# Patient Record
Sex: Male | Born: 1945 | Race: Black or African American | Hispanic: No | Smoking: Current some day smoker
Health system: Southern US, Community
[De-identification: ages and names within clinical notes are randomized; demographics above are authoritative.]

## PROBLEM LIST (undated history)

## (undated) DIAGNOSIS — Z789 Other specified health status: Secondary | ICD-10-CM

## (undated) DIAGNOSIS — I639 Cerebral infarction, unspecified: Secondary | ICD-10-CM

## (undated) DIAGNOSIS — F1010 Alcohol abuse, uncomplicated: Secondary | ICD-10-CM

## (undated) DIAGNOSIS — I119 Hypertensive heart disease without heart failure: Secondary | ICD-10-CM

## (undated) DIAGNOSIS — E785 Hyperlipidemia, unspecified: Secondary | ICD-10-CM

## (undated) DIAGNOSIS — F02818 Dementia in other diseases classified elsewhere, unspecified severity, with other behavioral disturbance: Secondary | ICD-10-CM

## (undated) DIAGNOSIS — F0281 Dementia in other diseases classified elsewhere with behavioral disturbance: Secondary | ICD-10-CM

## (undated) DIAGNOSIS — I219 Acute myocardial infarction, unspecified: Secondary | ICD-10-CM

## (undated) DIAGNOSIS — I43 Cardiomyopathy in diseases classified elsewhere: Secondary | ICD-10-CM

## (undated) DIAGNOSIS — Z593 Problems related to living in residential institution: Secondary | ICD-10-CM

## (undated) DIAGNOSIS — D638 Anemia in other chronic diseases classified elsewhere: Secondary | ICD-10-CM

## (undated) DIAGNOSIS — G309 Alzheimer's disease, unspecified: Secondary | ICD-10-CM

## (undated) HISTORY — DX: Acute myocardial infarction, unspecified: I21.9

## (undated) HISTORY — DX: Cerebral infarction, unspecified: I63.9

## (undated) HISTORY — DX: Cardiomyopathy in diseases classified elsewhere: I43

## (undated) HISTORY — DX: Hypertensive heart disease without heart failure: I11.9

## (undated) HISTORY — DX: Alcohol abuse, uncomplicated: F10.10

## (undated) HISTORY — DX: Dementia in other diseases classified elsewhere, unspecified severity, with other behavioral disturbance: F02.818

## (undated) HISTORY — DX: Anemia in other chronic diseases classified elsewhere: D63.8

## (undated) HISTORY — DX: Dementia in other diseases classified elsewhere with behavioral disturbance: F02.81

## (undated) HISTORY — DX: Alzheimer's disease, unspecified: G30.9

## (undated) HISTORY — PX: TONSILLECTOMY: SUR1361

---

## 1999-08-25 ENCOUNTER — Emergency Department (HOSPITAL_COMMUNITY): Admission: EM | Admit: 1999-08-25 | Discharge: 1999-08-25 | Payer: Self-pay | Admitting: Emergency Medicine

## 2006-02-23 ENCOUNTER — Emergency Department: Payer: Self-pay | Admitting: Unknown Physician Specialty

## 2006-03-04 ENCOUNTER — Other Ambulatory Visit: Payer: Self-pay

## 2006-03-04 ENCOUNTER — Emergency Department: Payer: Self-pay | Admitting: Emergency Medicine

## 2007-07-20 ENCOUNTER — Other Ambulatory Visit: Payer: Self-pay

## 2007-07-20 ENCOUNTER — Emergency Department: Payer: Self-pay | Admitting: Internal Medicine

## 2008-01-02 ENCOUNTER — Emergency Department: Payer: Self-pay | Admitting: Emergency Medicine

## 2008-09-04 ENCOUNTER — Other Ambulatory Visit: Payer: Self-pay

## 2008-09-04 ENCOUNTER — Emergency Department: Payer: Self-pay | Admitting: Emergency Medicine

## 2008-09-05 ENCOUNTER — Ambulatory Visit: Payer: Self-pay | Admitting: Emergency Medicine

## 2010-01-10 ENCOUNTER — Emergency Department: Payer: Self-pay | Admitting: Emergency Medicine

## 2011-04-30 ENCOUNTER — Emergency Department (HOSPITAL_COMMUNITY): Payer: PRIVATE HEALTH INSURANCE

## 2011-04-30 ENCOUNTER — Inpatient Hospital Stay (HOSPITAL_COMMUNITY): Payer: PRIVATE HEALTH INSURANCE

## 2011-04-30 ENCOUNTER — Encounter (HOSPITAL_COMMUNITY): Payer: Self-pay | Admitting: Radiology

## 2011-04-30 ENCOUNTER — Inpatient Hospital Stay (HOSPITAL_COMMUNITY)
Admission: EM | Admit: 2011-04-30 | Discharge: 2011-05-01 | DRG: 683 | Payer: PRIVATE HEALTH INSURANCE | Attending: Otolaryngology | Admitting: Otolaryngology

## 2011-04-30 DIAGNOSIS — I2510 Atherosclerotic heart disease of native coronary artery without angina pectoris: Secondary | ICD-10-CM | POA: Diagnosis present

## 2011-04-30 DIAGNOSIS — N400 Enlarged prostate without lower urinary tract symptoms: Secondary | ICD-10-CM | POA: Diagnosis present

## 2011-04-30 DIAGNOSIS — I700 Atherosclerosis of aorta: Secondary | ICD-10-CM | POA: Diagnosis present

## 2011-04-30 DIAGNOSIS — F17200 Nicotine dependence, unspecified, uncomplicated: Secondary | ICD-10-CM | POA: Diagnosis present

## 2011-04-30 DIAGNOSIS — N189 Chronic kidney disease, unspecified: Secondary | ICD-10-CM | POA: Diagnosis present

## 2011-04-30 DIAGNOSIS — J4489 Other specified chronic obstructive pulmonary disease: Secondary | ICD-10-CM | POA: Diagnosis present

## 2011-04-30 DIAGNOSIS — Q602 Renal agenesis, unspecified: Secondary | ICD-10-CM

## 2011-04-30 DIAGNOSIS — J8410 Pulmonary fibrosis, unspecified: Secondary | ICD-10-CM | POA: Diagnosis present

## 2011-04-30 DIAGNOSIS — Q605 Renal hypoplasia, unspecified: Secondary | ICD-10-CM

## 2011-04-30 DIAGNOSIS — R9431 Abnormal electrocardiogram [ECG] [EKG]: Secondary | ICD-10-CM | POA: Diagnosis present

## 2011-04-30 DIAGNOSIS — I129 Hypertensive chronic kidney disease with stage 1 through stage 4 chronic kidney disease, or unspecified chronic kidney disease: Principal | ICD-10-CM | POA: Diagnosis present

## 2011-04-30 DIAGNOSIS — J449 Chronic obstructive pulmonary disease, unspecified: Secondary | ICD-10-CM | POA: Diagnosis present

## 2011-04-30 DIAGNOSIS — K8020 Calculus of gallbladder without cholecystitis without obstruction: Secondary | ICD-10-CM | POA: Diagnosis present

## 2011-04-30 LAB — BASIC METABOLIC PANEL
CO2: 28 mEq/L (ref 19–32)
Calcium: 10.1 mg/dL (ref 8.4–10.5)
Creatinine, Ser: 2.98 mg/dL — ABNORMAL HIGH (ref 0.4–1.5)
GFR calc Af Amer: 26 mL/min — ABNORMAL LOW (ref >60)
GFR calc non Af Amer: 21 mL/min — ABNORMAL LOW (ref >60)

## 2011-04-30 LAB — POCT CARDIAC MARKERS: Myoglobin, poc: 34.4 ng/mL (ref 12–200)

## 2011-04-30 LAB — CBC
MCH: 27.4 pg (ref 26.0–34.0)
MCHC: 32.2 g/dL (ref 30.0–36.0)
Platelets: 193 10*3/uL (ref 150–400)
RDW: 13.5 % (ref 11.5–15.5)

## 2011-04-30 LAB — URINE MICROSCOPIC-ADD ON

## 2011-04-30 LAB — DIFFERENTIAL
Basophils Relative: 0 % (ref 0–1)
Eosinophils Absolute: 0.1 10*3/uL (ref 0.0–0.7)
Eosinophils Relative: 2 % (ref 0–5)
Monocytes Absolute: 0.7 10*3/uL (ref 0.1–1.0)
Monocytes Relative: 12 % (ref 3–12)

## 2011-04-30 LAB — URINALYSIS, ROUTINE W REFLEX MICROSCOPIC
Glucose, UA: NEGATIVE mg/dL
Protein, ur: NEGATIVE mg/dL

## 2011-04-30 LAB — HEPATIC FUNCTION PANEL
Albumin: 3.4 g/dL — ABNORMAL LOW (ref 3.5–5.2)
Bilirubin, Direct: <0.1 mg/dL (ref 0.0–0.3)
Total Bilirubin: 0.2 mg/dL — ABNORMAL LOW (ref 0.3–1.2)

## 2011-04-30 LAB — CREATININE, URINE, RANDOM: Creatinine, Urine: 35.3 mg/dL

## 2011-05-01 ENCOUNTER — Inpatient Hospital Stay (HOSPITAL_COMMUNITY): Payer: PRIVATE HEALTH INSURANCE

## 2011-05-01 LAB — DIFFERENTIAL
Basophils Relative: 0 % (ref 0–1)
Eosinophils Absolute: 0.1 10*3/uL (ref 0.0–0.7)
Monocytes Absolute: 0.9 10*3/uL (ref 0.1–1.0)
Neutro Abs: 2.5 10*3/uL (ref 1.7–7.7)

## 2011-05-01 LAB — INFLUENZA PANEL BY PCR (TYPE A & B): H1N1 flu by pcr: NOT DETECTED

## 2011-05-01 LAB — BASIC METABOLIC PANEL
BUN: 38 mg/dL — ABNORMAL HIGH (ref 6–23)
CO2: 26 mEq/L (ref 19–32)
Calcium: 9.5 mg/dL (ref 8.4–10.5)
Creatinine, Ser: 2.74 mg/dL — ABNORMAL HIGH (ref 0.4–1.5)
Glucose, Bld: 84 mg/dL (ref 70–99)

## 2011-05-01 LAB — CBC
MCH: 26.9 pg (ref 26.0–34.0)
MCHC: 31.7 g/dL (ref 30.0–36.0)
Platelets: 170 10*3/uL (ref 150–400)

## 2011-05-01 LAB — TSH: TSH: 3.341 u[IU]/mL (ref 0.350–4.500)

## 2011-05-01 LAB — MRSA PCR SCREENING: MRSA by PCR: NEGATIVE

## 2011-05-01 LAB — LIPID PANEL
HDL: 71 mg/dL (ref >39)
VLDL: 27 mg/dL (ref 0–40)

## 2011-05-01 LAB — PSA: PSA: 1.29 ng/mL (ref <=4.00)

## 2011-05-01 NOTE — H&P (Signed)
Kyle Short, Kyle Short              ACCOUNT NO.:  [id number]  MEDICAL RECORD NO.:  [id number]           PATIENT TYPE:  I  LOCATION:  A325                          FACILITY:  [facility]  PHYSICIAN:  Karlyn Agee, M.D. DATE OF BIRTH:  1946-02-10  DATE OF ADMISSION:  04/30/2011 DATE OF DISCHARGE:  LH                             HISTORY & PHYSICAL   PRIMARY CARE PHYSICIAN:  Unassigned.  CHIEF COMPLAINT:  Abnormal EKG.  HISTORY OF PRESENT ILLNESS:  This is a 65 year old African American gentleman with a history of hypertension.  He is usually seen at [facility] but has not had any medical followup for at least a year and is now incarcerated in the [facility] for the past 3 weeks.  The patient was taken for physical evaluation at [place] today and an EKG was done which was noted to be markedly abnormal and blood work was done which showed evidence of renal failure, and the patient was sent to the [facility] Emergency Room for further evaluation.  The patient denies any chest pain.  He denies any fever, cough, or cold. He denies any difficulty passing his urine either frequency or dysuria or blood in the urine.  He has never been told he has kidney disease.  PAST MEDICAL HISTORY:  Hypertension.  MEDICATIONS:  None.  ALLERGIES:  No known drug allergies.  SOCIAL HISTORY:  He is a retired Engineer, manufacturing systems.  When he is not in prison, he lives with a friend.  His next of kin is his daughter.  He reports his code status is a full code.  He reports that he smokes about three cigarettes per day but up to about 2 years ago, he was smoking much more about half a pack a day and typically maybe drinks about a beer per day.  FAMILY HISTORY:  Significant for mother who had diabetes and hypertension, a brother who had colon cancer, father who had Alzheimer's.  REVIEW OF SYSTEMS:  Other than noted above unremarkable.  PHYSICAL EXAMINATION:  GENERAL:  A pleasant, average built,  middle-aged Caucasian gentleman, lying flat on the stretcher, not in any acute distress. VITAL SIGNS:  His temperature is 98.1.  His pulse was initially 75, respirations 18, blood pressure initially 173/131.  He is on 10 mg of labetalol intravenously, and his blood pressure is now 160/78.  He is saturating at 100% on room air. HEENT AND NECK:  His pupils are round and equal.  Mucous membranes pink. Anicteric.  No cervical lymphadenopathy or thyromegaly.  No carotid bruit.  He has multiple broken and carious teeth.  No oral Candida. CHEST:  Clear to auscultation bilaterally. CARDIOVASCULAR:  Regular rhythm.  No murmurs. ABDOMEN:  Scaphoid, soft, and nontender.  No masses. EXTREMITIES:  Without edema.  He has 2+ dorsalis pedis pulses bilaterally.  He has arthritic deformities of the knees and ankles. SKIN:  Warm and dry.  No ulcerations. CENTRAL NERVOUS SYSTEM:  Cranial nerves II through XII are grossly intact.  He has no focal lateralizing signs.  LABORATORY DATA:  His CBC is unremarkable.  The white count of 5.8, hemoglobin 12.8,  platelets 193, and a normal differential.  Sodium is a little low at 134, potassium 3.9, chloride 99, CO2 is 28, glucose 128, BUN 40, creatinine 2.98, calcium is 10.1.  Cardiac markers are completely normal with undetectable troponins, myoglobin of 34, CK-MB 1.5.  Urinalysis shows clear urine with a trace of blood, negative for proteins, nitrites, leukocyte esterase, or ketones.  Urine microscopy is unremarkable.  CT scan of the head without contrast shows chronic small vessel ischemic disease with no acute abnormality. Two-view chest x-ray shows vague nodular densities of the right and left suprahilar region, right infrahilar airspace disease on the frontal and lateral views consistent with perihilar pneumonia, cardiomegaly. His EKG showed marked left ventricular hypertrophy with R-wave in aVL measuring 14 mm and asymmetrical T-wave inversions due to  repolarization abnormalities.  ASSESSMENT: 1. Hypertension, uncontrolled. 2. Kidney failure, acute versus chronic, in presence of hyponatremia     is in favor of acute. 3. Tobacco abuse. 4. Abnormal chest x-ray, rule out malignancy. 5. Tobacco abuse. 6. Medical noncompliance.  PLAN: 1. We will admit this gentleman for hydration to challenge his kidneys     and see if his renal function improves. 2. We will check his PSA and also get ultrasound of his kidneys. 3. Pending in improvement of his renal function, we will get a CT scan     of his chest to further characterize his lung abnormalities.  This     is unlikely to be a pneumonia given the absence of other symptoms     or signs of pneumonia.  We will control his blood pressure with     amlodipine and Lopressor initially 4. Other plans as per orders.     Karlyn Agee, M.D.    LC/MEDQ  D:  04/30/2011  T:  04/30/2011  Job:  [id number]  cc:   [place]  Electronically Signed by Karlyn Agee M.D. on 05/01/2011 09:09:52 AM

## 2011-05-05 NOTE — Discharge Summary (Signed)
NAMEHERCHELL, MARCEAU              ACCOUNT NO.:  [id number]  MEDICAL RECORD NO.:  [id number]           PATIENT TYPE:  I  LOCATION:  A325                          FACILITY:  [facility]  PHYSICIAN:  Peytin Dechert L. Conley Canal, MDDATE OF BIRTH:  [birth year]  DATE OF ADMISSION:  04/30/2011 DATE OF DISCHARGE:  05/18/2012LH                              DISCHARGE SUMMARY   DISCHARGE DIAGNOSES: 1. Malignant hypertension. 2. Headache and dizziness secondary to above. 3. Renal failure, suspect chronic. 4. Tobacco abuse, counseled against. 5. Prostatomegaly. 6. 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 7. Asymptomatic cholelithiasis. 8. Right upper lobe granuloma. 9. Chronic obstructive pulmonary disease with scarring. 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 11.Abnormal EKG, left ventricular hypertrophy with repolarization     abnormality.  DISCHARGE MEDICATIONS: 1. Metoprolol 50 mg p.o. b.i.d. 2. Norvasc 10 mg a day. 3. Aspirin 81 mg a day.  DIET:  Low-salt, heart healthy.  ACTIVITY:  Ad lib.  FOLLOWUP:  He will need to follow up with a primary care provider within 1-2 weeks in prison and then will need to have a primary care provider arranged for further monitoring or workup of his renal failure and malignant hypertension.  Also follow up his pending TSH and lipid profile.  Also follow up his PSA.  CONDITION:  Stable.  HISTORY AND HOSPITAL COURSE:  Mr. Kyle Short is an incarcerated 65 year old black male with a history of hypertension.  He has not taken his antihypertensives for several years.  He had no known reported history of kidney problems prior to admission that he could recall.  Apparently, he had headache and dizziness.  He went to see the prison nurse who noted that his blood pressure was high.  He was evaluated at [facility] who noted an abnormal EKG and elevated blood pressure, so the patient was sent to  the emergency room.  In the ER, his blood pressure was 173/131 and it decreased to 160/78 after IV labetalol.  His heart rate was 75 and otherwise normal vital signs.  He had poor dentition, clear lung sounds, moist mucous membranes, and no signs of cough or fever.  A chest x-ray initially showed concern of infiltrate and nodular densities.  The patient had LVH on EKG and a creatinine of almost 3.  There were no old creatinines or medical history initially on arrival.  The patient was admitted and given IV fluids.  A renal ultrasound showed a solitary left kidney which was 10 cm.  No hydronephrosis and normal renal cortical thickness.  Records were received from [facility] where the patient had been hospitalized previously.  In 2002, his creatinine was about 1.3, in 2007 it was 1.5, and in 2009 was 1.7.  I suspect this is progression of chronic kidney disease due to untreated hypertension and congenital solitary kidney. The patient's fractional excretion of sodium was measured above 1%.  His creatinine improved slightly with hydration but I suspect his baseline creatinine is close to his admission creatinine.  At the time of discharge, his blood pressure control was not optimal  but improved.  The prison staff is requesting discharge as soon as possible for resource reasons.  He is feeling much better.  No longer has a headache or dizziness.  He has had no chest pain, shortness of breath, or cough and is stable for discharge.  He will need further adjustment of his blood pressure medications of course.  LABORATORY DATA:  CBC normal.  Complete metabolic panel on admission significant for BUN of 40, creatinine 2.98.  Creatinine at discharge after hydration is 2.74.  Magnesium is normal.  Liver function tests were significant for an albumin of 3.4 only.  Cardiac enzymes normal. Fractional excretion of sodium is about 3%.  Flu swab was negative.  DIAGNOSTIC DATA:  EKG showed normal sinus  rhythm with LVH and repolarization abnormalities.  Chest x-ray showed cardiomegaly, right infrahilar pneumonia, cardiomegaly without failure.  CT of the brain showed chronic small vessel ischemia, nothing acute.  CT of the chest showed gallstones, right upper lobe granuloma, COPD with scarring.  No mass or lung nodule.  Renal ultrasound showed a solitary left kidney measuring 10 cm, no hydronephrosis, moderate prostatomegaly with gallstones.  TIME SPENT:  Total time on the day of discharge is greater than 30 minutes.     Jacey Eckerson L. Conley Canal, MD     CLS/MEDQ  D:  05/01/2011  T:  05/01/2011  Job:  [id number]  Electronically Signed by Doree Barthel MD on 05/05/2011 11:08:40 AM

## 2012-05-26 LAB — URINALYSIS, COMPLETE
Glucose,UR: NEGATIVE mg/dL (ref 0–75)
Ketone: NEGATIVE
Nitrite: NEGATIVE
Protein: 100
RBC,UR: 1 /HPF (ref 0–5)
WBC UR: 3 /HPF (ref 0–5)

## 2012-05-26 LAB — COMPREHENSIVE METABOLIC PANEL
Albumin: 3.5 g/dL (ref 3.4–5.0)
Alkaline Phosphatase: 92 U/L (ref 50–136)
Anion Gap: 12 (ref 7–16)
BUN: 24 mg/dL — ABNORMAL HIGH (ref 7–18)
Bilirubin,Total: 0.3 mg/dL (ref 0.2–1.0)
Calcium, Total: 9 mg/dL (ref 8.5–10.1)
Chloride: 108 mmol/L — ABNORMAL HIGH (ref 98–107)
Co2: 21 mmol/L (ref 21–32)
Creatinine: 2.74 mg/dL — ABNORMAL HIGH (ref 0.60–1.30)
EGFR (African American): 27 — ABNORMAL LOW
EGFR (Non-African Amer.): 23 — ABNORMAL LOW
Glucose: 134 mg/dL — ABNORMAL HIGH (ref 65–99)
Osmolality: 287 (ref 275–301)
Sodium: 141 mmol/L (ref 136–145)

## 2012-05-26 LAB — CBC
MCH: 27.1 pg (ref 26.0–34.0)
MCHC: 30.9 g/dL — ABNORMAL LOW (ref 32.0–36.0)
Platelet: 209 10*3/uL (ref 150–440)

## 2012-05-27 ENCOUNTER — Inpatient Hospital Stay: Payer: Self-pay | Admitting: Internal Medicine

## 2012-05-27 LAB — MAGNESIUM: Magnesium: 1.1 mg/dL — ABNORMAL LOW

## 2012-05-27 LAB — TSH: Thyroid Stimulating Horm: 1.12 u[IU]/mL

## 2012-05-27 LAB — ETHANOL: Ethanol %: <0.003 % (ref 0.000–0.080)

## 2012-05-28 LAB — CBC WITH DIFFERENTIAL/PLATELET
Eosinophil %: 0.1 %
Lymphocyte #: 1.7 10*3/uL (ref 1.0–3.6)
Lymphocyte %: 21.4 %
MCHC: 31.4 g/dL — ABNORMAL LOW (ref 32.0–36.0)
MCV: 86 fL (ref 80–100)
Monocyte %: 8.7 %
Neutrophil #: 5.6 10*3/uL (ref 1.4–6.5)
Neutrophil %: 69.6 %
Platelet: 197 10*3/uL (ref 150–440)
RBC: 4.3 10*6/uL — ABNORMAL LOW (ref 4.40–5.90)
RDW: 14.2 % (ref 11.5–14.5)
WBC: 8.1 10*3/uL (ref 3.8–10.6)

## 2012-05-28 LAB — BASIC METABOLIC PANEL
BUN: 29 mg/dL — ABNORMAL HIGH (ref 7–18)
Chloride: 103 mmol/L (ref 98–107)
Co2: 24 mmol/L (ref 21–32)
Creatinine: 2.22 mg/dL — ABNORMAL HIGH (ref 0.60–1.30)
EGFR (African American): 35 — ABNORMAL LOW
Osmolality: 281 (ref 275–301)
Potassium: 3.6 mmol/L (ref 3.5–5.1)
Sodium: 137 mmol/L (ref 136–145)

## 2012-05-28 LAB — LIPID PANEL
HDL Cholesterol: 121 mg/dL — ABNORMAL HIGH (ref 40–60)
Ldl Cholesterol, Calc: 72 mg/dL (ref 0–100)
Triglycerides: 59 mg/dL (ref 0–200)
VLDL Cholesterol, Calc: 12 mg/dL (ref 5–40)

## 2012-05-28 LAB — MAGNESIUM: Magnesium: 1.7 mg/dL — ABNORMAL LOW

## 2012-05-29 LAB — CREATININE, SERUM
EGFR (African American): 37 — ABNORMAL LOW
EGFR (Non-African Amer.): 32 — ABNORMAL LOW

## 2012-05-30 LAB — CBC WITH DIFFERENTIAL/PLATELET
Basophil #: 0 10*3/uL (ref 0.0–0.1)
Eosinophil #: 0.1 10*3/uL (ref 0.0–0.7)
Eosinophil %: 1.3 %
HCT: 36.3 % — ABNORMAL LOW (ref 40.0–52.0)
Lymphocyte #: 2.3 10*3/uL (ref 1.0–3.6)
MCH: 27.7 pg (ref 26.0–34.0)
MCHC: 32.3 g/dL (ref 32.0–36.0)
MCV: 86 fL (ref 80–100)
Monocyte #: 0.8 x10 3/mm (ref 0.2–1.0)
Monocyte %: 13.6 %
Neutrophil #: 2.7 10*3/uL (ref 1.4–6.5)
RBC: 4.23 10*6/uL — ABNORMAL LOW (ref 4.40–5.90)
RDW: 14 % (ref 11.5–14.5)

## 2012-05-30 LAB — BASIC METABOLIC PANEL
Calcium, Total: 9 mg/dL (ref 8.5–10.1)
Chloride: 103 mmol/L (ref 98–107)
Co2: 26 mmol/L (ref 21–32)
EGFR (African American): 33 — ABNORMAL LOW
EGFR (Non-African Amer.): 29 — ABNORMAL LOW
Glucose: 99 mg/dL (ref 65–99)
Sodium: 137 mmol/L (ref 136–145)

## 2013-01-13 ENCOUNTER — Inpatient Hospital Stay: Payer: Self-pay | Admitting: Specialist

## 2013-01-13 DIAGNOSIS — I214 Non-ST elevation (NSTEMI) myocardial infarction: Secondary | ICD-10-CM | POA: Diagnosis not present

## 2013-01-13 DIAGNOSIS — I129 Hypertensive chronic kidney disease with stage 1 through stage 4 chronic kidney disease, or unspecified chronic kidney disease: Secondary | ICD-10-CM | POA: Diagnosis not present

## 2013-01-13 DIAGNOSIS — Z9119 Patient's noncompliance with other medical treatment and regimen: Secondary | ICD-10-CM | POA: Diagnosis not present

## 2013-01-13 DIAGNOSIS — R748 Abnormal levels of other serum enzymes: Secondary | ICD-10-CM | POA: Diagnosis not present

## 2013-01-13 DIAGNOSIS — R799 Abnormal finding of blood chemistry, unspecified: Secondary | ICD-10-CM | POA: Diagnosis not present

## 2013-01-13 DIAGNOSIS — F17200 Nicotine dependence, unspecified, uncomplicated: Secondary | ICD-10-CM | POA: Diagnosis not present

## 2013-01-13 DIAGNOSIS — I509 Heart failure, unspecified: Secondary | ICD-10-CM | POA: Diagnosis not present

## 2013-01-13 DIAGNOSIS — I5022 Chronic systolic (congestive) heart failure: Secondary | ICD-10-CM | POA: Diagnosis not present

## 2013-01-13 DIAGNOSIS — R55 Syncope and collapse: Secondary | ICD-10-CM | POA: Diagnosis not present

## 2013-01-13 DIAGNOSIS — N189 Chronic kidney disease, unspecified: Secondary | ICD-10-CM | POA: Diagnosis not present

## 2013-01-13 DIAGNOSIS — I951 Orthostatic hypotension: Secondary | ICD-10-CM | POA: Diagnosis not present

## 2013-01-13 DIAGNOSIS — Z7982 Long term (current) use of aspirin: Secondary | ICD-10-CM | POA: Diagnosis not present

## 2013-01-13 DIAGNOSIS — I428 Other cardiomyopathies: Secondary | ICD-10-CM | POA: Diagnosis not present

## 2013-01-13 DIAGNOSIS — I10 Essential (primary) hypertension: Secondary | ICD-10-CM | POA: Diagnosis not present

## 2013-01-13 LAB — COMPREHENSIVE METABOLIC PANEL
Anion Gap: 11 (ref 7–16)
BUN: 57 mg/dL — ABNORMAL HIGH (ref 7–18)
Chloride: 108 mmol/L — ABNORMAL HIGH (ref 98–107)
EGFR (Non-African Amer.): 26 — ABNORMAL LOW
Glucose: 112 mg/dL — ABNORMAL HIGH (ref 65–99)
Osmolality: 292 (ref 275–301)
SGOT(AST): 53 U/L — ABNORMAL HIGH (ref 15–37)

## 2013-01-13 LAB — CBC WITH DIFFERENTIAL/PLATELET
Basophil #: 0 10*3/uL (ref 0.0–0.1)
Basophil %: 0.3 %
Eosinophil #: 0 10*3/uL (ref 0.0–0.7)
Eosinophil %: 0.3 %
HGB: 12.2 g/dL — ABNORMAL LOW (ref 13.0–18.0)
Lymphocyte #: 2.3 10*3/uL (ref 1.0–3.6)
Lymphocyte %: 26.3 %
MCH: 28.3 pg (ref 26.0–34.0)
Monocyte %: 12.5 %
Neutrophil #: 5.3 10*3/uL (ref 1.4–6.5)
Neutrophil %: 60.6 %
Platelet: 167 10*3/uL (ref 150–440)
RBC: 4.33 10*6/uL — ABNORMAL LOW (ref 4.40–5.90)
RDW: 13.1 % (ref 11.5–14.5)

## 2013-01-13 LAB — TROPONIN I: Troponin-I: 0.53 ng/mL — ABNORMAL HIGH

## 2013-01-13 LAB — CK TOTAL AND CKMB (NOT AT ARMC)
CK, Total: 902 U/L — ABNORMAL HIGH (ref 35–232)
CK-MB: 14.6 ng/mL — ABNORMAL HIGH (ref 0.5–3.6)

## 2013-01-14 LAB — CBC WITH DIFFERENTIAL/PLATELET
Basophil #: 0.1 10*3/uL (ref 0.0–0.1)
Eosinophil %: 2.2 %
HCT: 34.6 % — ABNORMAL LOW (ref 40.0–52.0)
Lymphocyte %: 41.6 %
MCH: 28.4 pg (ref 26.0–34.0)
MCV: 85 fL (ref 80–100)
Monocyte #: 1.1 x10 3/mm — ABNORMAL HIGH (ref 0.2–1.0)
Neutrophil %: 43.9 %
Platelet: 159 10*3/uL (ref 150–440)
RBC: 4.09 10*6/uL — ABNORMAL LOW (ref 4.40–5.90)
WBC: 9.4 10*3/uL (ref 3.8–10.6)

## 2013-01-14 LAB — CK TOTAL AND CKMB (NOT AT ARMC)
CK, Total: 1018 U/L — ABNORMAL HIGH (ref 35–232)
CK, Total: 1119 U/L — ABNORMAL HIGH (ref 35–232)
CK-MB: 10.2 ng/mL — ABNORMAL HIGH (ref 0.5–3.6)

## 2013-01-14 LAB — URINALYSIS, COMPLETE
Bacteria: NONE SEEN
Glucose,UR: NEGATIVE mg/dL (ref 0–75)
Ketone: NEGATIVE
Nitrite: NEGATIVE
Ph: 5 (ref 4.5–8.0)
Protein: 30
RBC,UR: 1 /HPF (ref 0–5)
Specific Gravity: 1.019 (ref 1.003–1.030)
Squamous Epithelial: <1
WBC UR: 1 /HPF (ref 0–5)

## 2013-01-14 LAB — APTT
Activated PTT: 131.3 secs — ABNORMAL HIGH (ref 23.6–35.9)
Activated PTT: 94.6 secs — ABNORMAL HIGH (ref 23.6–35.9)

## 2013-01-14 LAB — BASIC METABOLIC PANEL
BUN: 50 mg/dL — ABNORMAL HIGH (ref 7–18)
Calcium, Total: 8.6 mg/dL (ref 8.5–10.1)
Chloride: 108 mmol/L — ABNORMAL HIGH (ref 98–107)
Co2: 23 mmol/L (ref 21–32)
Creatinine: 2.41 mg/dL — ABNORMAL HIGH (ref 0.60–1.30)
EGFR (African American): 31 — ABNORMAL LOW
EGFR (Non-African Amer.): 27 — ABNORMAL LOW
Glucose: 124 mg/dL — ABNORMAL HIGH (ref 65–99)
Osmolality: 294 (ref 275–301)
Potassium: 3.5 mmol/L (ref 3.5–5.1)

## 2013-01-14 LAB — TROPONIN I
Troponin-I: 0.43 ng/mL — ABNORMAL HIGH
Troponin-I: 0.32 ng/mL — ABNORMAL HIGH

## 2013-01-14 LAB — HEMOGLOBIN A1C: Hemoglobin A1C: 6.6 % — ABNORMAL HIGH (ref 4.2–6.3)

## 2013-01-14 LAB — LIPID PANEL
Ldl Cholesterol, Calc: 41 mg/dL (ref 0–100)
Triglycerides: 86 mg/dL (ref 0–200)
VLDL Cholesterol, Calc: 17 mg/dL (ref 5–40)

## 2013-01-15 LAB — BASIC METABOLIC PANEL
Chloride: 106 mmol/L (ref 98–107)
Creatinine: 2.01 mg/dL — ABNORMAL HIGH (ref 0.60–1.30)
EGFR (African American): 39 — ABNORMAL LOW
Potassium: 3.8 mmol/L (ref 3.5–5.1)
Sodium: 138 mmol/L (ref 136–145)

## 2013-01-24 DIAGNOSIS — R079 Chest pain, unspecified: Secondary | ICD-10-CM | POA: Diagnosis not present

## 2013-01-26 DIAGNOSIS — I10 Essential (primary) hypertension: Secondary | ICD-10-CM | POA: Diagnosis not present

## 2013-01-31 DIAGNOSIS — I10 Essential (primary) hypertension: Secondary | ICD-10-CM | POA: Diagnosis not present

## 2013-01-31 DIAGNOSIS — I2510 Atherosclerotic heart disease of native coronary artery without angina pectoris: Secondary | ICD-10-CM | POA: Diagnosis not present

## 2013-01-31 DIAGNOSIS — E782 Mixed hyperlipidemia: Secondary | ICD-10-CM | POA: Diagnosis not present

## 2013-02-13 DIAGNOSIS — N2581 Secondary hyperparathyroidism of renal origin: Secondary | ICD-10-CM | POA: Diagnosis not present

## 2013-02-13 DIAGNOSIS — E875 Hyperkalemia: Secondary | ICD-10-CM | POA: Diagnosis not present

## 2013-02-13 DIAGNOSIS — I10 Essential (primary) hypertension: Secondary | ICD-10-CM | POA: Diagnosis not present

## 2013-02-28 DIAGNOSIS — F1021 Alcohol dependence, in remission: Secondary | ICD-10-CM | POA: Diagnosis not present

## 2013-02-28 DIAGNOSIS — N19 Unspecified kidney failure: Secondary | ICD-10-CM | POA: Diagnosis not present

## 2013-02-28 DIAGNOSIS — R262 Difficulty in walking, not elsewhere classified: Secondary | ICD-10-CM | POA: Diagnosis not present

## 2013-02-28 DIAGNOSIS — I951 Orthostatic hypotension: Secondary | ICD-10-CM | POA: Diagnosis not present

## 2013-02-28 DIAGNOSIS — R55 Syncope and collapse: Secondary | ICD-10-CM | POA: Diagnosis not present

## 2013-02-28 DIAGNOSIS — I10 Essential (primary) hypertension: Secondary | ICD-10-CM | POA: Diagnosis not present

## 2013-02-28 DIAGNOSIS — Z9181 History of falling: Secondary | ICD-10-CM | POA: Diagnosis not present

## 2013-02-28 DIAGNOSIS — R279 Unspecified lack of coordination: Secondary | ICD-10-CM | POA: Diagnosis not present

## 2013-02-28 DIAGNOSIS — Z87891 Personal history of nicotine dependence: Secondary | ICD-10-CM | POA: Diagnosis not present

## 2013-03-06 DIAGNOSIS — I10 Essential (primary) hypertension: Secondary | ICD-10-CM | POA: Diagnosis not present

## 2013-03-06 DIAGNOSIS — E785 Hyperlipidemia, unspecified: Secondary | ICD-10-CM | POA: Diagnosis not present

## 2013-03-08 DIAGNOSIS — R262 Difficulty in walking, not elsewhere classified: Secondary | ICD-10-CM | POA: Diagnosis not present

## 2013-03-08 DIAGNOSIS — R55 Syncope and collapse: Secondary | ICD-10-CM | POA: Diagnosis not present

## 2013-03-08 DIAGNOSIS — I951 Orthostatic hypotension: Secondary | ICD-10-CM | POA: Diagnosis not present

## 2013-03-08 DIAGNOSIS — R279 Unspecified lack of coordination: Secondary | ICD-10-CM | POA: Diagnosis not present

## 2013-03-08 DIAGNOSIS — I10 Essential (primary) hypertension: Secondary | ICD-10-CM | POA: Diagnosis not present

## 2013-03-08 DIAGNOSIS — N19 Unspecified kidney failure: Secondary | ICD-10-CM | POA: Diagnosis not present

## 2013-03-09 DIAGNOSIS — I951 Orthostatic hypotension: Secondary | ICD-10-CM | POA: Diagnosis not present

## 2013-03-09 DIAGNOSIS — I10 Essential (primary) hypertension: Secondary | ICD-10-CM | POA: Diagnosis not present

## 2013-03-09 DIAGNOSIS — R279 Unspecified lack of coordination: Secondary | ICD-10-CM | POA: Diagnosis not present

## 2013-03-09 DIAGNOSIS — N19 Unspecified kidney failure: Secondary | ICD-10-CM | POA: Diagnosis not present

## 2013-03-09 DIAGNOSIS — R55 Syncope and collapse: Secondary | ICD-10-CM | POA: Diagnosis not present

## 2013-03-09 DIAGNOSIS — R262 Difficulty in walking, not elsewhere classified: Secondary | ICD-10-CM | POA: Diagnosis not present

## 2013-03-10 DIAGNOSIS — N19 Unspecified kidney failure: Secondary | ICD-10-CM | POA: Diagnosis not present

## 2013-03-10 DIAGNOSIS — R279 Unspecified lack of coordination: Secondary | ICD-10-CM | POA: Diagnosis not present

## 2013-03-10 DIAGNOSIS — I10 Essential (primary) hypertension: Secondary | ICD-10-CM | POA: Diagnosis not present

## 2013-03-10 DIAGNOSIS — R262 Difficulty in walking, not elsewhere classified: Secondary | ICD-10-CM | POA: Diagnosis not present

## 2013-03-10 DIAGNOSIS — I951 Orthostatic hypotension: Secondary | ICD-10-CM | POA: Diagnosis not present

## 2013-03-10 DIAGNOSIS — R55 Syncope and collapse: Secondary | ICD-10-CM | POA: Diagnosis not present

## 2013-03-13 DIAGNOSIS — R55 Syncope and collapse: Secondary | ICD-10-CM | POA: Diagnosis not present

## 2013-03-13 DIAGNOSIS — I10 Essential (primary) hypertension: Secondary | ICD-10-CM | POA: Diagnosis not present

## 2013-03-13 DIAGNOSIS — R262 Difficulty in walking, not elsewhere classified: Secondary | ICD-10-CM | POA: Diagnosis not present

## 2013-03-13 DIAGNOSIS — R279 Unspecified lack of coordination: Secondary | ICD-10-CM | POA: Diagnosis not present

## 2013-03-13 DIAGNOSIS — N19 Unspecified kidney failure: Secondary | ICD-10-CM | POA: Diagnosis not present

## 2013-03-13 DIAGNOSIS — I951 Orthostatic hypotension: Secondary | ICD-10-CM | POA: Diagnosis not present

## 2013-03-14 DIAGNOSIS — R262 Difficulty in walking, not elsewhere classified: Secondary | ICD-10-CM | POA: Diagnosis not present

## 2013-03-14 DIAGNOSIS — I10 Essential (primary) hypertension: Secondary | ICD-10-CM | POA: Diagnosis not present

## 2013-03-14 DIAGNOSIS — R55 Syncope and collapse: Secondary | ICD-10-CM | POA: Diagnosis not present

## 2013-03-14 DIAGNOSIS — N19 Unspecified kidney failure: Secondary | ICD-10-CM | POA: Diagnosis not present

## 2013-03-14 DIAGNOSIS — I951 Orthostatic hypotension: Secondary | ICD-10-CM | POA: Diagnosis not present

## 2013-03-14 DIAGNOSIS — R279 Unspecified lack of coordination: Secondary | ICD-10-CM | POA: Diagnosis not present

## 2013-03-15 DIAGNOSIS — R279 Unspecified lack of coordination: Secondary | ICD-10-CM | POA: Diagnosis not present

## 2013-03-15 DIAGNOSIS — N19 Unspecified kidney failure: Secondary | ICD-10-CM | POA: Diagnosis not present

## 2013-03-15 DIAGNOSIS — I10 Essential (primary) hypertension: Secondary | ICD-10-CM | POA: Diagnosis not present

## 2013-03-15 DIAGNOSIS — R262 Difficulty in walking, not elsewhere classified: Secondary | ICD-10-CM | POA: Diagnosis not present

## 2013-03-15 DIAGNOSIS — I951 Orthostatic hypotension: Secondary | ICD-10-CM | POA: Diagnosis not present

## 2013-03-15 DIAGNOSIS — R55 Syncope and collapse: Secondary | ICD-10-CM | POA: Diagnosis not present

## 2013-03-20 DIAGNOSIS — R55 Syncope and collapse: Secondary | ICD-10-CM | POA: Diagnosis not present

## 2013-03-20 DIAGNOSIS — N19 Unspecified kidney failure: Secondary | ICD-10-CM | POA: Diagnosis not present

## 2013-03-20 DIAGNOSIS — I10 Essential (primary) hypertension: Secondary | ICD-10-CM | POA: Diagnosis not present

## 2013-03-20 DIAGNOSIS — I951 Orthostatic hypotension: Secondary | ICD-10-CM | POA: Diagnosis not present

## 2013-03-20 DIAGNOSIS — R279 Unspecified lack of coordination: Secondary | ICD-10-CM | POA: Diagnosis not present

## 2013-03-20 DIAGNOSIS — R262 Difficulty in walking, not elsewhere classified: Secondary | ICD-10-CM | POA: Diagnosis not present

## 2013-03-21 DIAGNOSIS — R279 Unspecified lack of coordination: Secondary | ICD-10-CM | POA: Diagnosis not present

## 2013-03-21 DIAGNOSIS — I951 Orthostatic hypotension: Secondary | ICD-10-CM | POA: Diagnosis not present

## 2013-03-21 DIAGNOSIS — I10 Essential (primary) hypertension: Secondary | ICD-10-CM | POA: Diagnosis not present

## 2013-03-21 DIAGNOSIS — R55 Syncope and collapse: Secondary | ICD-10-CM | POA: Diagnosis not present

## 2013-03-21 DIAGNOSIS — R262 Difficulty in walking, not elsewhere classified: Secondary | ICD-10-CM | POA: Diagnosis not present

## 2013-03-21 DIAGNOSIS — N19 Unspecified kidney failure: Secondary | ICD-10-CM | POA: Diagnosis not present

## 2013-03-22 DIAGNOSIS — R262 Difficulty in walking, not elsewhere classified: Secondary | ICD-10-CM | POA: Diagnosis not present

## 2013-03-22 DIAGNOSIS — R55 Syncope and collapse: Secondary | ICD-10-CM | POA: Diagnosis not present

## 2013-03-22 DIAGNOSIS — N19 Unspecified kidney failure: Secondary | ICD-10-CM | POA: Diagnosis not present

## 2013-03-22 DIAGNOSIS — I10 Essential (primary) hypertension: Secondary | ICD-10-CM | POA: Diagnosis not present

## 2013-03-22 DIAGNOSIS — R279 Unspecified lack of coordination: Secondary | ICD-10-CM | POA: Diagnosis not present

## 2013-03-22 DIAGNOSIS — I951 Orthostatic hypotension: Secondary | ICD-10-CM | POA: Diagnosis not present

## 2013-03-27 DIAGNOSIS — N19 Unspecified kidney failure: Secondary | ICD-10-CM | POA: Diagnosis not present

## 2013-03-27 DIAGNOSIS — R262 Difficulty in walking, not elsewhere classified: Secondary | ICD-10-CM | POA: Diagnosis not present

## 2013-03-27 DIAGNOSIS — I951 Orthostatic hypotension: Secondary | ICD-10-CM | POA: Diagnosis not present

## 2013-03-27 DIAGNOSIS — R279 Unspecified lack of coordination: Secondary | ICD-10-CM | POA: Diagnosis not present

## 2013-03-27 DIAGNOSIS — R55 Syncope and collapse: Secondary | ICD-10-CM | POA: Diagnosis not present

## 2013-03-27 DIAGNOSIS — I10 Essential (primary) hypertension: Secondary | ICD-10-CM | POA: Diagnosis not present

## 2013-03-29 DIAGNOSIS — N19 Unspecified kidney failure: Secondary | ICD-10-CM | POA: Diagnosis not present

## 2013-03-29 DIAGNOSIS — R262 Difficulty in walking, not elsewhere classified: Secondary | ICD-10-CM | POA: Diagnosis not present

## 2013-03-29 DIAGNOSIS — R279 Unspecified lack of coordination: Secondary | ICD-10-CM | POA: Diagnosis not present

## 2013-03-29 DIAGNOSIS — I10 Essential (primary) hypertension: Secondary | ICD-10-CM | POA: Diagnosis not present

## 2013-03-29 DIAGNOSIS — R55 Syncope and collapse: Secondary | ICD-10-CM | POA: Diagnosis not present

## 2013-03-29 DIAGNOSIS — I951 Orthostatic hypotension: Secondary | ICD-10-CM | POA: Diagnosis not present

## 2013-04-01 DIAGNOSIS — R55 Syncope and collapse: Secondary | ICD-10-CM | POA: Diagnosis not present

## 2013-04-01 DIAGNOSIS — R279 Unspecified lack of coordination: Secondary | ICD-10-CM | POA: Diagnosis not present

## 2013-04-01 DIAGNOSIS — N19 Unspecified kidney failure: Secondary | ICD-10-CM | POA: Diagnosis not present

## 2013-04-01 DIAGNOSIS — I951 Orthostatic hypotension: Secondary | ICD-10-CM | POA: Diagnosis not present

## 2013-04-01 DIAGNOSIS — I10 Essential (primary) hypertension: Secondary | ICD-10-CM | POA: Diagnosis not present

## 2013-04-01 DIAGNOSIS — R262 Difficulty in walking, not elsewhere classified: Secondary | ICD-10-CM | POA: Diagnosis not present

## 2013-04-03 DIAGNOSIS — R279 Unspecified lack of coordination: Secondary | ICD-10-CM | POA: Diagnosis not present

## 2013-04-03 DIAGNOSIS — R262 Difficulty in walking, not elsewhere classified: Secondary | ICD-10-CM | POA: Diagnosis not present

## 2013-04-03 DIAGNOSIS — I951 Orthostatic hypotension: Secondary | ICD-10-CM | POA: Diagnosis not present

## 2013-04-03 DIAGNOSIS — I10 Essential (primary) hypertension: Secondary | ICD-10-CM | POA: Diagnosis not present

## 2013-04-03 DIAGNOSIS — N19 Unspecified kidney failure: Secondary | ICD-10-CM | POA: Diagnosis not present

## 2013-04-03 DIAGNOSIS — R55 Syncope and collapse: Secondary | ICD-10-CM | POA: Diagnosis not present

## 2013-04-04 DIAGNOSIS — R279 Unspecified lack of coordination: Secondary | ICD-10-CM | POA: Diagnosis not present

## 2013-04-04 DIAGNOSIS — N19 Unspecified kidney failure: Secondary | ICD-10-CM | POA: Diagnosis not present

## 2013-04-04 DIAGNOSIS — I951 Orthostatic hypotension: Secondary | ICD-10-CM | POA: Diagnosis not present

## 2013-04-04 DIAGNOSIS — R55 Syncope and collapse: Secondary | ICD-10-CM | POA: Diagnosis not present

## 2013-04-04 DIAGNOSIS — R262 Difficulty in walking, not elsewhere classified: Secondary | ICD-10-CM | POA: Diagnosis not present

## 2013-04-04 DIAGNOSIS — I10 Essential (primary) hypertension: Secondary | ICD-10-CM | POA: Diagnosis not present

## 2013-04-06 DIAGNOSIS — R262 Difficulty in walking, not elsewhere classified: Secondary | ICD-10-CM | POA: Diagnosis not present

## 2013-04-06 DIAGNOSIS — R279 Unspecified lack of coordination: Secondary | ICD-10-CM | POA: Diagnosis not present

## 2013-04-06 DIAGNOSIS — N19 Unspecified kidney failure: Secondary | ICD-10-CM | POA: Diagnosis not present

## 2013-04-06 DIAGNOSIS — I10 Essential (primary) hypertension: Secondary | ICD-10-CM | POA: Diagnosis not present

## 2013-04-06 DIAGNOSIS — I951 Orthostatic hypotension: Secondary | ICD-10-CM | POA: Diagnosis not present

## 2013-04-06 DIAGNOSIS — R55 Syncope and collapse: Secondary | ICD-10-CM | POA: Diagnosis not present

## 2013-04-11 DIAGNOSIS — I951 Orthostatic hypotension: Secondary | ICD-10-CM | POA: Diagnosis not present

## 2013-04-11 DIAGNOSIS — R55 Syncope and collapse: Secondary | ICD-10-CM | POA: Diagnosis not present

## 2013-04-11 DIAGNOSIS — R279 Unspecified lack of coordination: Secondary | ICD-10-CM | POA: Diagnosis not present

## 2013-04-11 DIAGNOSIS — I10 Essential (primary) hypertension: Secondary | ICD-10-CM | POA: Diagnosis not present

## 2013-04-11 DIAGNOSIS — R262 Difficulty in walking, not elsewhere classified: Secondary | ICD-10-CM | POA: Diagnosis not present

## 2013-04-11 DIAGNOSIS — N19 Unspecified kidney failure: Secondary | ICD-10-CM | POA: Diagnosis not present

## 2013-04-13 DIAGNOSIS — I951 Orthostatic hypotension: Secondary | ICD-10-CM | POA: Diagnosis not present

## 2013-04-13 DIAGNOSIS — R262 Difficulty in walking, not elsewhere classified: Secondary | ICD-10-CM | POA: Diagnosis not present

## 2013-04-13 DIAGNOSIS — N19 Unspecified kidney failure: Secondary | ICD-10-CM | POA: Diagnosis not present

## 2013-04-13 DIAGNOSIS — R279 Unspecified lack of coordination: Secondary | ICD-10-CM | POA: Diagnosis not present

## 2013-04-13 DIAGNOSIS — I10 Essential (primary) hypertension: Secondary | ICD-10-CM | POA: Diagnosis not present

## 2013-04-13 DIAGNOSIS — R55 Syncope and collapse: Secondary | ICD-10-CM | POA: Diagnosis not present

## 2013-04-17 DIAGNOSIS — R262 Difficulty in walking, not elsewhere classified: Secondary | ICD-10-CM | POA: Diagnosis not present

## 2013-04-17 DIAGNOSIS — I951 Orthostatic hypotension: Secondary | ICD-10-CM | POA: Diagnosis not present

## 2013-04-17 DIAGNOSIS — R55 Syncope and collapse: Secondary | ICD-10-CM | POA: Diagnosis not present

## 2013-04-17 DIAGNOSIS — N19 Unspecified kidney failure: Secondary | ICD-10-CM | POA: Diagnosis not present

## 2013-04-17 DIAGNOSIS — R279 Unspecified lack of coordination: Secondary | ICD-10-CM | POA: Diagnosis not present

## 2013-04-17 DIAGNOSIS — I10 Essential (primary) hypertension: Secondary | ICD-10-CM | POA: Diagnosis not present

## 2013-04-21 DIAGNOSIS — I10 Essential (primary) hypertension: Secondary | ICD-10-CM | POA: Diagnosis not present

## 2013-04-21 DIAGNOSIS — N039 Chronic nephritic syndrome with unspecified morphologic changes: Secondary | ICD-10-CM | POA: Diagnosis not present

## 2013-04-21 DIAGNOSIS — N184 Chronic kidney disease, stage 4 (severe): Secondary | ICD-10-CM | POA: Diagnosis not present

## 2013-04-21 DIAGNOSIS — D631 Anemia in chronic kidney disease: Secondary | ICD-10-CM | POA: Diagnosis not present

## 2013-04-21 DIAGNOSIS — N2581 Secondary hyperparathyroidism of renal origin: Secondary | ICD-10-CM | POA: Diagnosis not present

## 2013-04-21 DIAGNOSIS — R809 Proteinuria, unspecified: Secondary | ICD-10-CM | POA: Diagnosis not present

## 2013-04-21 DIAGNOSIS — B351 Tinea unguium: Secondary | ICD-10-CM | POA: Diagnosis not present

## 2013-04-21 DIAGNOSIS — M79609 Pain in unspecified limb: Secondary | ICD-10-CM | POA: Diagnosis not present

## 2013-04-24 DIAGNOSIS — D631 Anemia in chronic kidney disease: Secondary | ICD-10-CM | POA: Diagnosis not present

## 2013-04-24 DIAGNOSIS — N184 Chronic kidney disease, stage 4 (severe): Secondary | ICD-10-CM | POA: Diagnosis not present

## 2013-04-24 DIAGNOSIS — R809 Proteinuria, unspecified: Secondary | ICD-10-CM | POA: Diagnosis not present

## 2013-05-09 DIAGNOSIS — I519 Heart disease, unspecified: Secondary | ICD-10-CM | POA: Diagnosis not present

## 2013-05-09 DIAGNOSIS — E782 Mixed hyperlipidemia: Secondary | ICD-10-CM | POA: Diagnosis not present

## 2013-05-09 DIAGNOSIS — I10 Essential (primary) hypertension: Secondary | ICD-10-CM | POA: Diagnosis not present

## 2013-05-09 DIAGNOSIS — I2510 Atherosclerotic heart disease of native coronary artery without angina pectoris: Secondary | ICD-10-CM | POA: Diagnosis not present

## 2013-05-12 ENCOUNTER — Ambulatory Visit: Payer: Self-pay | Admitting: Family Medicine

## 2013-05-12 DIAGNOSIS — F17200 Nicotine dependence, unspecified, uncomplicated: Secondary | ICD-10-CM | POA: Diagnosis not present

## 2013-05-12 DIAGNOSIS — R918 Other nonspecific abnormal finding of lung field: Secondary | ICD-10-CM | POA: Diagnosis not present

## 2013-05-12 DIAGNOSIS — R9389 Abnormal findings on diagnostic imaging of other specified body structures: Secondary | ICD-10-CM | POA: Diagnosis not present

## 2013-05-12 DIAGNOSIS — Z8673 Personal history of transient ischemic attack (TIA), and cerebral infarction without residual deficits: Secondary | ICD-10-CM | POA: Diagnosis not present

## 2013-05-12 DIAGNOSIS — E785 Hyperlipidemia, unspecified: Secondary | ICD-10-CM | POA: Diagnosis not present

## 2013-05-12 DIAGNOSIS — G4700 Insomnia, unspecified: Secondary | ICD-10-CM | POA: Diagnosis not present

## 2013-05-26 DIAGNOSIS — N2581 Secondary hyperparathyroidism of renal origin: Secondary | ICD-10-CM | POA: Diagnosis not present

## 2013-05-26 DIAGNOSIS — N184 Chronic kidney disease, stage 4 (severe): Secondary | ICD-10-CM | POA: Diagnosis not present

## 2013-05-26 DIAGNOSIS — D631 Anemia in chronic kidney disease: Secondary | ICD-10-CM | POA: Diagnosis not present

## 2013-07-06 DIAGNOSIS — B351 Tinea unguium: Secondary | ICD-10-CM | POA: Diagnosis not present

## 2013-07-06 DIAGNOSIS — M79609 Pain in unspecified limb: Secondary | ICD-10-CM | POA: Diagnosis not present

## 2013-07-31 DIAGNOSIS — N184 Chronic kidney disease, stage 4 (severe): Secondary | ICD-10-CM | POA: Diagnosis not present

## 2013-07-31 DIAGNOSIS — D631 Anemia in chronic kidney disease: Secondary | ICD-10-CM | POA: Diagnosis not present

## 2013-07-31 DIAGNOSIS — N2581 Secondary hyperparathyroidism of renal origin: Secondary | ICD-10-CM | POA: Diagnosis not present

## 2013-07-31 DIAGNOSIS — I10 Essential (primary) hypertension: Secondary | ICD-10-CM | POA: Diagnosis not present

## 2013-08-03 DIAGNOSIS — Z0100 Encounter for examination of eyes and vision without abnormal findings: Secondary | ICD-10-CM | POA: Diagnosis not present

## 2013-08-03 DIAGNOSIS — H52229 Regular astigmatism, unspecified eye: Secondary | ICD-10-CM | POA: Diagnosis not present

## 2013-08-03 DIAGNOSIS — H524 Presbyopia: Secondary | ICD-10-CM | POA: Diagnosis not present

## 2013-08-03 DIAGNOSIS — H2510 Age-related nuclear cataract, unspecified eye: Secondary | ICD-10-CM | POA: Diagnosis not present

## 2013-08-29 DIAGNOSIS — N184 Chronic kidney disease, stage 4 (severe): Secondary | ICD-10-CM | POA: Diagnosis not present

## 2013-08-29 DIAGNOSIS — N2581 Secondary hyperparathyroidism of renal origin: Secondary | ICD-10-CM | POA: Diagnosis not present

## 2013-08-29 DIAGNOSIS — D631 Anemia in chronic kidney disease: Secondary | ICD-10-CM | POA: Diagnosis not present

## 2013-08-29 DIAGNOSIS — I10 Essential (primary) hypertension: Secondary | ICD-10-CM | POA: Diagnosis not present

## 2013-09-12 ENCOUNTER — Inpatient Hospital Stay: Payer: Self-pay | Admitting: Family Medicine

## 2013-09-12 DIAGNOSIS — R05 Cough: Secondary | ICD-10-CM | POA: Diagnosis not present

## 2013-09-12 DIAGNOSIS — Z79899 Other long term (current) drug therapy: Secondary | ICD-10-CM | POA: Diagnosis not present

## 2013-09-12 DIAGNOSIS — I428 Other cardiomyopathies: Secondary | ICD-10-CM | POA: Diagnosis not present

## 2013-09-12 DIAGNOSIS — J9819 Other pulmonary collapse: Secondary | ICD-10-CM | POA: Diagnosis not present

## 2013-09-12 DIAGNOSIS — G9341 Metabolic encephalopathy: Secondary | ICD-10-CM | POA: Diagnosis not present

## 2013-09-12 DIAGNOSIS — N189 Chronic kidney disease, unspecified: Secondary | ICD-10-CM | POA: Diagnosis present

## 2013-09-12 DIAGNOSIS — I129 Hypertensive chronic kidney disease with stage 1 through stage 4 chronic kidney disease, or unspecified chronic kidney disease: Secondary | ICD-10-CM | POA: Diagnosis present

## 2013-09-12 DIAGNOSIS — I10 Essential (primary) hypertension: Secondary | ICD-10-CM | POA: Diagnosis not present

## 2013-09-12 DIAGNOSIS — Z8249 Family history of ischemic heart disease and other diseases of the circulatory system: Secondary | ICD-10-CM | POA: Diagnosis not present

## 2013-09-12 DIAGNOSIS — B964 Proteus (mirabilis) (morganii) as the cause of diseases classified elsewhere: Secondary | ICD-10-CM | POA: Diagnosis present

## 2013-09-12 DIAGNOSIS — D649 Anemia, unspecified: Secondary | ICD-10-CM | POA: Diagnosis not present

## 2013-09-12 DIAGNOSIS — Z23 Encounter for immunization: Secondary | ICD-10-CM | POA: Diagnosis not present

## 2013-09-12 DIAGNOSIS — Z7982 Long term (current) use of aspirin: Secondary | ICD-10-CM | POA: Diagnosis not present

## 2013-09-12 DIAGNOSIS — Z9119 Patient's noncompliance with other medical treatment and regimen: Secondary | ICD-10-CM | POA: Diagnosis not present

## 2013-09-12 DIAGNOSIS — I498 Other specified cardiac arrhythmias: Secondary | ICD-10-CM | POA: Diagnosis present

## 2013-09-12 DIAGNOSIS — N179 Acute kidney failure, unspecified: Secondary | ICD-10-CM | POA: Diagnosis not present

## 2013-09-12 DIAGNOSIS — R0989 Other specified symptoms and signs involving the circulatory and respiratory systems: Secondary | ICD-10-CM | POA: Diagnosis not present

## 2013-09-12 DIAGNOSIS — Z833 Family history of diabetes mellitus: Secondary | ICD-10-CM | POA: Diagnosis not present

## 2013-09-12 DIAGNOSIS — N390 Urinary tract infection, site not specified: Secondary | ICD-10-CM | POA: Diagnosis not present

## 2013-09-12 DIAGNOSIS — F17200 Nicotine dependence, unspecified, uncomplicated: Secondary | ICD-10-CM | POA: Diagnosis present

## 2013-09-12 DIAGNOSIS — R4182 Altered mental status, unspecified: Secondary | ICD-10-CM | POA: Diagnosis not present

## 2013-09-12 LAB — URINALYSIS, COMPLETE
Bilirubin,UR: NEGATIVE
Glucose,UR: NEGATIVE mg/dL (ref 0–75)
Hyaline Cast: 45
Ketone: NEGATIVE
Nitrite: NEGATIVE
Ph: 6 (ref 4.5–8.0)
RBC,UR: 71 /HPF (ref 0–5)
Squamous Epithelial: 7
WBC UR: 656 /HPF (ref 0–5)

## 2013-09-12 LAB — DRUG SCREEN, URINE
Amphetamines, Ur Screen: NEGATIVE (ref ?–1000)
Barbiturates, Ur Screen: NEGATIVE (ref ?–200)
Benzodiazepine, Ur Scrn: NEGATIVE (ref ?–200)
Cocaine Metabolite,Ur ~~LOC~~: NEGATIVE (ref ?–300)
MDMA (Ecstasy)Ur Screen: NEGATIVE (ref ?–500)
Methadone, Ur Screen: NEGATIVE (ref ?–300)
Phencyclidine (PCP) Ur S: NEGATIVE (ref ?–25)

## 2013-09-12 LAB — TROPONIN I: Troponin-I: 0.02 ng/mL

## 2013-09-12 LAB — COMPREHENSIVE METABOLIC PANEL
Alkaline Phosphatase: 100 U/L (ref 50–136)
Anion Gap: 6 — ABNORMAL LOW (ref 7–16)
BUN: 32 mg/dL — ABNORMAL HIGH (ref 7–18)
Bilirubin,Total: 0.3 mg/dL (ref 0.2–1.0)
Calcium, Total: 9.2 mg/dL (ref 8.5–10.1)
Chloride: 105 mmol/L (ref 98–107)
Creatinine: 2.67 mg/dL — ABNORMAL HIGH (ref 0.60–1.30)
EGFR (African American): 28 — ABNORMAL LOW
Glucose: 166 mg/dL — ABNORMAL HIGH (ref 65–99)
Osmolality: 281 (ref 275–301)
Potassium: 4.5 mmol/L (ref 3.5–5.1)
SGOT(AST): 34 U/L (ref 15–37)
Total Protein: 8.1 g/dL (ref 6.4–8.2)

## 2013-09-12 LAB — CBC
HCT: 37.5 % — ABNORMAL LOW (ref 40.0–52.0)
MCHC: 32.9 g/dL (ref 32.0–36.0)
Platelet: 209 10*3/uL (ref 150–440)
WBC: 4.6 10*3/uL (ref 3.8–10.6)

## 2013-09-12 LAB — ETHANOL
Ethanol %: <0.003 % (ref 0.000–0.080)
Ethanol: <3 mg/dL

## 2013-09-12 LAB — PROTIME-INR: Prothrombin Time: 13.2 secs (ref 11.5–14.7)

## 2013-09-13 LAB — HEMOGLOBIN: HGB: 10.6 g/dL — ABNORMAL LOW (ref 13.0–18.0)

## 2013-09-13 LAB — CBC WITH DIFFERENTIAL/PLATELET
Basophil %: 0.5 %
Eosinophil #: 0.2 10*3/uL (ref 0.0–0.7)
Eosinophil %: 2.8 %
HCT: 29.5 % — ABNORMAL LOW (ref 40.0–52.0)
HGB: 9.9 g/dL — ABNORMAL LOW (ref 13.0–18.0)
Lymphocyte #: 2.5 10*3/uL (ref 1.0–3.6)
Lymphocyte %: 41.2 %
MCH: 28.4 pg (ref 26.0–34.0)
Neutrophil #: 2.5 10*3/uL (ref 1.4–6.5)
Platelet: 177 10*3/uL (ref 150–440)
RBC: 3.48 10*6/uL — ABNORMAL LOW (ref 4.40–5.90)
RDW: 13.8 % (ref 11.5–14.5)
WBC: 6 10*3/uL (ref 3.8–10.6)

## 2013-09-13 LAB — MAGNESIUM: Magnesium: 1.3 mg/dL — ABNORMAL LOW

## 2013-09-13 LAB — BASIC METABOLIC PANEL
BUN: 30 mg/dL — ABNORMAL HIGH (ref 7–18)
EGFR (Non-African Amer.): 29 — ABNORMAL LOW
Glucose: 99 mg/dL (ref 65–99)
Osmolality: 284 (ref 275–301)
Potassium: 4 mmol/L (ref 3.5–5.1)

## 2013-09-14 LAB — BASIC METABOLIC PANEL
BUN: 26 mg/dL — ABNORMAL HIGH (ref 7–18)
Calcium, Total: 9.1 mg/dL (ref 8.5–10.1)
Creatinine: 2.3 mg/dL — ABNORMAL HIGH (ref 0.60–1.30)
EGFR (African American): 33 — ABNORMAL LOW
EGFR (Non-African Amer.): 29 — ABNORMAL LOW
Glucose: 103 mg/dL — ABNORMAL HIGH (ref 65–99)
Sodium: 138 mmol/L (ref 136–145)

## 2013-09-14 LAB — CBC WITH DIFFERENTIAL/PLATELET
Basophil #: 0 10*3/uL (ref 0.0–0.1)
Basophil %: 0.7 %
HCT: 32.8 % — ABNORMAL LOW (ref 40.0–52.0)
HGB: 11 g/dL — ABNORMAL LOW (ref 13.0–18.0)
MCH: 28 pg (ref 26.0–34.0)
Neutrophil %: 38.5 %

## 2013-09-14 LAB — URINE CULTURE

## 2013-09-17 LAB — CULTURE, BLOOD (SINGLE)

## 2013-09-21 DIAGNOSIS — E119 Type 2 diabetes mellitus without complications: Secondary | ICD-10-CM | POA: Diagnosis not present

## 2013-09-21 DIAGNOSIS — N390 Urinary tract infection, site not specified: Secondary | ICD-10-CM | POA: Diagnosis not present

## 2013-09-21 DIAGNOSIS — I10 Essential (primary) hypertension: Secondary | ICD-10-CM | POA: Diagnosis not present

## 2013-09-21 DIAGNOSIS — E785 Hyperlipidemia, unspecified: Secondary | ICD-10-CM | POA: Diagnosis not present

## 2013-10-04 ENCOUNTER — Observation Stay: Payer: Self-pay | Admitting: Internal Medicine

## 2013-10-04 DIAGNOSIS — E860 Dehydration: Secondary | ICD-10-CM | POA: Diagnosis not present

## 2013-10-04 DIAGNOSIS — I658 Occlusion and stenosis of other precerebral arteries: Secondary | ICD-10-CM | POA: Diagnosis not present

## 2013-10-04 DIAGNOSIS — Z87440 Personal history of urinary (tract) infections: Secondary | ICD-10-CM | POA: Diagnosis not present

## 2013-10-04 DIAGNOSIS — R799 Abnormal finding of blood chemistry, unspecified: Secondary | ICD-10-CM | POA: Diagnosis not present

## 2013-10-04 DIAGNOSIS — I129 Hypertensive chronic kidney disease with stage 1 through stage 4 chronic kidney disease, or unspecified chronic kidney disease: Secondary | ICD-10-CM | POA: Diagnosis not present

## 2013-10-04 DIAGNOSIS — M6281 Muscle weakness (generalized): Secondary | ICD-10-CM | POA: Diagnosis not present

## 2013-10-04 DIAGNOSIS — Z833 Family history of diabetes mellitus: Secondary | ICD-10-CM | POA: Diagnosis not present

## 2013-10-04 DIAGNOSIS — F17200 Nicotine dependence, unspecified, uncomplicated: Secondary | ICD-10-CM | POA: Diagnosis not present

## 2013-10-04 DIAGNOSIS — R748 Abnormal levels of other serum enzymes: Secondary | ICD-10-CM | POA: Diagnosis not present

## 2013-10-04 DIAGNOSIS — G9341 Metabolic encephalopathy: Secondary | ICD-10-CM | POA: Diagnosis not present

## 2013-10-04 DIAGNOSIS — R0602 Shortness of breath: Secondary | ICD-10-CM | POA: Diagnosis not present

## 2013-10-04 DIAGNOSIS — R4182 Altered mental status, unspecified: Secondary | ICD-10-CM | POA: Diagnosis not present

## 2013-10-04 DIAGNOSIS — N179 Acute kidney failure, unspecified: Secondary | ICD-10-CM | POA: Diagnosis not present

## 2013-10-04 DIAGNOSIS — R55 Syncope and collapse: Secondary | ICD-10-CM | POA: Diagnosis not present

## 2013-10-04 DIAGNOSIS — Z7982 Long term (current) use of aspirin: Secondary | ICD-10-CM | POA: Diagnosis not present

## 2013-10-04 DIAGNOSIS — N189 Chronic kidney disease, unspecified: Secondary | ICD-10-CM | POA: Diagnosis not present

## 2013-10-04 DIAGNOSIS — I428 Other cardiomyopathies: Secondary | ICD-10-CM | POA: Diagnosis not present

## 2013-10-04 DIAGNOSIS — Z8249 Family history of ischemic heart disease and other diseases of the circulatory system: Secondary | ICD-10-CM | POA: Diagnosis not present

## 2013-10-04 DIAGNOSIS — I214 Non-ST elevation (NSTEMI) myocardial infarction: Secondary | ICD-10-CM | POA: Diagnosis not present

## 2013-10-04 LAB — CBC WITH DIFFERENTIAL/PLATELET
HGB: 12.5 g/dL — ABNORMAL LOW (ref 13.0–18.0)
Platelet: 189 10*3/uL (ref 150–440)
RDW: 13.8 % (ref 11.5–14.5)
Segmented Neutrophils: 50 %
WBC: 5.1 10*3/uL (ref 3.8–10.6)

## 2013-10-04 LAB — URINALYSIS, COMPLETE
Blood: NEGATIVE
Glucose,UR: NEGATIVE mg/dL (ref 0–75)
Hyaline Cast: 3
Protein: NEGATIVE
RBC,UR: 1 /HPF (ref 0–5)
Specific Gravity: 1.015 (ref 1.003–1.030)
Squamous Epithelial: <1

## 2013-10-04 LAB — COMPREHENSIVE METABOLIC PANEL
Albumin: 3.2 g/dL — ABNORMAL LOW (ref 3.4–5.0)
Alkaline Phosphatase: 88 U/L (ref 50–136)
BUN: 37 mg/dL — ABNORMAL HIGH (ref 7–18)
Bilirubin,Total: 0.1 mg/dL — ABNORMAL LOW (ref 0.2–1.0)
Co2: 20 mmol/L — ABNORMAL LOW (ref 21–32)
EGFR (African American): 20 — ABNORMAL LOW
Glucose: 107 mg/dL — ABNORMAL HIGH (ref 65–99)
Osmolality: 285 (ref 275–301)
Potassium: 4.3 mmol/L (ref 3.5–5.1)
SGOT(AST): 35 U/L (ref 15–37)
SGPT (ALT): 26 U/L (ref 12–78)
Sodium: 138 mmol/L (ref 136–145)

## 2013-10-04 LAB — CK TOTAL AND CKMB (NOT AT ARMC)
CK, Total: 105 U/L (ref 35–232)
CK, Total: 99 U/L (ref 35–232)
CK-MB: 2.2 ng/mL (ref 0.5–3.6)

## 2013-10-04 LAB — TROPONIN I: Troponin-I: 0.06 ng/mL — ABNORMAL HIGH

## 2013-10-05 DIAGNOSIS — R55 Syncope and collapse: Secondary | ICD-10-CM | POA: Diagnosis not present

## 2013-10-05 DIAGNOSIS — R42 Dizziness and giddiness: Secondary | ICD-10-CM | POA: Diagnosis not present

## 2013-10-05 DIAGNOSIS — R799 Abnormal finding of blood chemistry, unspecified: Secondary | ICD-10-CM | POA: Diagnosis not present

## 2013-10-05 DIAGNOSIS — R4182 Altered mental status, unspecified: Secondary | ICD-10-CM | POA: Diagnosis not present

## 2013-10-05 DIAGNOSIS — N179 Acute kidney failure, unspecified: Secondary | ICD-10-CM | POA: Diagnosis not present

## 2013-10-05 LAB — COMPREHENSIVE METABOLIC PANEL
Albumin: 2.8 g/dL — ABNORMAL LOW (ref 3.4–5.0)
Bilirubin,Total: 0.2 mg/dL (ref 0.2–1.0)
Calcium, Total: 8.6 mg/dL (ref 8.5–10.1)
Co2: 24 mmol/L (ref 21–32)
Creatinine: 2.9 mg/dL — ABNORMAL HIGH (ref 0.60–1.30)
EGFR (African American): 25 — ABNORMAL LOW
Potassium: 3.5 mmol/L (ref 3.5–5.1)
SGOT(AST): 29 U/L (ref 15–37)
Sodium: 139 mmol/L (ref 136–145)
Total Protein: 6.9 g/dL (ref 6.4–8.2)

## 2013-10-05 LAB — CBC WITH DIFFERENTIAL/PLATELET
Basophil #: 0 10*3/uL (ref 0.0–0.1)
Basophil %: 0.4 %
Eosinophil #: 0.1 10*3/uL (ref 0.0–0.7)
Eosinophil %: 2.1 %
Lymphocyte #: 3.4 10*3/uL (ref 1.0–3.6)
MCH: 29.4 pg (ref 26.0–34.0)
MCHC: 35 g/dL (ref 32.0–36.0)
MCV: 84 fL (ref 80–100)
Monocyte #: 0.8 x10 3/mm (ref 0.2–1.0)
Monocyte %: 12.4 %
Neutrophil #: 2.2 10*3/uL (ref 1.4–6.5)
Neutrophil %: 33.5 %
RBC: 3.72 10*6/uL — ABNORMAL LOW (ref 4.40–5.90)
RDW: 14.1 % (ref 11.5–14.5)

## 2013-10-05 LAB — CK TOTAL AND CKMB (NOT AT ARMC): CK, Total: 98 U/L (ref 35–232)

## 2013-10-16 DIAGNOSIS — I10 Essential (primary) hypertension: Secondary | ICD-10-CM | POA: Diagnosis not present

## 2013-10-16 DIAGNOSIS — N390 Urinary tract infection, site not specified: Secondary | ICD-10-CM | POA: Diagnosis not present

## 2013-10-16 DIAGNOSIS — N289 Disorder of kidney and ureter, unspecified: Secondary | ICD-10-CM | POA: Diagnosis not present

## 2013-10-16 DIAGNOSIS — R4182 Altered mental status, unspecified: Secondary | ICD-10-CM | POA: Diagnosis not present

## 2013-10-19 DIAGNOSIS — R55 Syncope and collapse: Secondary | ICD-10-CM | POA: Diagnosis not present

## 2013-10-19 DIAGNOSIS — R002 Palpitations: Secondary | ICD-10-CM | POA: Diagnosis not present

## 2013-10-19 DIAGNOSIS — I10 Essential (primary) hypertension: Secondary | ICD-10-CM | POA: Diagnosis not present

## 2013-11-03 DIAGNOSIS — E119 Type 2 diabetes mellitus without complications: Secondary | ICD-10-CM | POA: Diagnosis not present

## 2013-11-03 DIAGNOSIS — R262 Difficulty in walking, not elsewhere classified: Secondary | ICD-10-CM | POA: Diagnosis not present

## 2013-11-03 DIAGNOSIS — F17200 Nicotine dependence, unspecified, uncomplicated: Secondary | ICD-10-CM | POA: Diagnosis not present

## 2013-11-03 DIAGNOSIS — M6281 Muscle weakness (generalized): Secondary | ICD-10-CM | POA: Diagnosis not present

## 2013-11-07 DIAGNOSIS — R55 Syncope and collapse: Secondary | ICD-10-CM | POA: Diagnosis not present

## 2013-11-11 ENCOUNTER — Emergency Department: Payer: Self-pay | Admitting: Emergency Medicine

## 2013-11-11 DIAGNOSIS — Z79899 Other long term (current) drug therapy: Secondary | ICD-10-CM | POA: Diagnosis not present

## 2013-11-11 DIAGNOSIS — J189 Pneumonia, unspecified organism: Secondary | ICD-10-CM | POA: Diagnosis not present

## 2013-11-11 DIAGNOSIS — Z8679 Personal history of other diseases of the circulatory system: Secondary | ICD-10-CM | POA: Diagnosis not present

## 2013-11-11 DIAGNOSIS — R569 Unspecified convulsions: Secondary | ICD-10-CM | POA: Diagnosis not present

## 2013-11-11 DIAGNOSIS — I129 Hypertensive chronic kidney disease with stage 1 through stage 4 chronic kidney disease, or unspecified chronic kidney disease: Secondary | ICD-10-CM | POA: Diagnosis not present

## 2013-11-11 DIAGNOSIS — N189 Chronic kidney disease, unspecified: Secondary | ICD-10-CM | POA: Diagnosis not present

## 2013-11-11 DIAGNOSIS — G40909 Epilepsy, unspecified, not intractable, without status epilepticus: Secondary | ICD-10-CM | POA: Diagnosis not present

## 2013-11-11 DIAGNOSIS — Z9889 Other specified postprocedural states: Secondary | ICD-10-CM | POA: Diagnosis not present

## 2013-11-11 LAB — COMPREHENSIVE METABOLIC PANEL WITH GFR
Albumin: 3.2 g/dL — ABNORMAL LOW
Alkaline Phosphatase: 87 U/L
Anion Gap: 5 — ABNORMAL LOW
BUN: 33 mg/dL — ABNORMAL HIGH
Bilirubin,Total: 0.2 mg/dL
Calcium, Total: 8.7 mg/dL
Chloride: 108 mmol/L — ABNORMAL HIGH
Co2: 26 mmol/L
Creatinine: 2.73 mg/dL — ABNORMAL HIGH
EGFR (African American): 27 — ABNORMAL LOW
EGFR (Non-African Amer.): 23 — ABNORMAL LOW
Glucose: 136 mg/dL — ABNORMAL HIGH
Osmolality: 287
Potassium: 4.2 mmol/L
SGOT(AST): 42 U/L — ABNORMAL HIGH
SGPT (ALT): 28 U/L
Sodium: 139 mmol/L
Total Protein: 7.7 g/dL

## 2013-11-11 LAB — CBC
HCT: 35 % — ABNORMAL LOW (ref 40.0–52.0)
MCH: 28.2 pg (ref 26.0–34.0)
MCV: 87 fL (ref 80–100)
RBC: 4.04 10*6/uL — ABNORMAL LOW (ref 4.40–5.90)
RDW: 14.2 % (ref 11.5–14.5)
WBC: 8.6 10*3/uL (ref 3.8–10.6)

## 2013-11-11 LAB — DRUG SCREEN, URINE
Amphetamines, Ur Screen: NEGATIVE (ref ?–1000)
Barbiturates, Ur Screen: NEGATIVE (ref ?–200)
Benzodiazepine, Ur Scrn: NEGATIVE (ref ?–200)
Cannabinoid 50 Ng, Ur ~~LOC~~: NEGATIVE (ref ?–50)
Cocaine Metabolite,Ur ~~LOC~~: NEGATIVE (ref ?–300)
MDMA (Ecstasy)Ur Screen: NEGATIVE (ref ?–500)
Opiate, Ur Screen: NEGATIVE (ref ?–300)
Phencyclidine (PCP) Ur S: NEGATIVE (ref ?–25)

## 2013-11-11 LAB — URINALYSIS, COMPLETE
Bacteria: NONE SEEN
Bilirubin,UR: NEGATIVE
Glucose,UR: NEGATIVE mg/dL (ref 0–75)
Ketone: NEGATIVE
Nitrite: NEGATIVE
Ph: 5 (ref 4.5–8.0)
Protein: NEGATIVE
RBC,UR: <1 /HPF (ref 0–5)
Specific Gravity: 1.017 (ref 1.003–1.030)
Squamous Epithelial: 1
WBC UR: 9 /HPF (ref 0–5)

## 2013-11-11 LAB — SALICYLATE LEVEL: Salicylates, Serum: <1.7 mg/dL

## 2013-11-11 LAB — ETHANOL
Ethanol %: <0.003 % (ref 0.000–0.080)
Ethanol: <3 mg/dL

## 2013-11-11 LAB — ACETAMINOPHEN LEVEL: Acetaminophen: <2 ug/mL

## 2013-11-14 DIAGNOSIS — R42 Dizziness and giddiness: Secondary | ICD-10-CM | POA: Diagnosis not present

## 2013-11-14 DIAGNOSIS — R55 Syncope and collapse: Secondary | ICD-10-CM | POA: Diagnosis not present

## 2013-11-14 DIAGNOSIS — I10 Essential (primary) hypertension: Secondary | ICD-10-CM | POA: Diagnosis not present

## 2013-11-23 DIAGNOSIS — R55 Syncope and collapse: Secondary | ICD-10-CM | POA: Diagnosis not present

## 2013-11-28 DIAGNOSIS — N184 Chronic kidney disease, stage 4 (severe): Secondary | ICD-10-CM | POA: Diagnosis not present

## 2013-11-28 DIAGNOSIS — I10 Essential (primary) hypertension: Secondary | ICD-10-CM | POA: Diagnosis not present

## 2013-11-28 DIAGNOSIS — N2581 Secondary hyperparathyroidism of renal origin: Secondary | ICD-10-CM | POA: Diagnosis not present

## 2013-11-28 DIAGNOSIS — D631 Anemia in chronic kidney disease: Secondary | ICD-10-CM | POA: Diagnosis not present

## 2013-12-22 DIAGNOSIS — F17200 Nicotine dependence, unspecified, uncomplicated: Secondary | ICD-10-CM | POA: Diagnosis not present

## 2013-12-22 DIAGNOSIS — Z8673 Personal history of transient ischemic attack (TIA), and cerebral infarction without residual deficits: Secondary | ICD-10-CM | POA: Diagnosis not present

## 2013-12-22 DIAGNOSIS — E785 Hyperlipidemia, unspecified: Secondary | ICD-10-CM | POA: Diagnosis not present

## 2013-12-22 DIAGNOSIS — I509 Heart failure, unspecified: Secondary | ICD-10-CM | POA: Diagnosis not present

## 2014-02-01 DIAGNOSIS — E785 Hyperlipidemia, unspecified: Secondary | ICD-10-CM | POA: Diagnosis not present

## 2014-02-20 DIAGNOSIS — I10 Essential (primary) hypertension: Secondary | ICD-10-CM | POA: Diagnosis not present

## 2014-02-20 DIAGNOSIS — I2510 Atherosclerotic heart disease of native coronary artery without angina pectoris: Secondary | ICD-10-CM | POA: Diagnosis not present

## 2014-02-20 DIAGNOSIS — E782 Mixed hyperlipidemia: Secondary | ICD-10-CM | POA: Diagnosis not present

## 2014-02-23 DIAGNOSIS — D631 Anemia in chronic kidney disease: Secondary | ICD-10-CM | POA: Diagnosis not present

## 2014-02-23 DIAGNOSIS — N183 Chronic kidney disease, stage 3 unspecified: Secondary | ICD-10-CM | POA: Diagnosis not present

## 2014-02-23 DIAGNOSIS — N2581 Secondary hyperparathyroidism of renal origin: Secondary | ICD-10-CM | POA: Diagnosis not present

## 2014-02-23 DIAGNOSIS — I10 Essential (primary) hypertension: Secondary | ICD-10-CM | POA: Diagnosis not present

## 2014-02-23 DIAGNOSIS — N039 Chronic nephritic syndrome with unspecified morphologic changes: Secondary | ICD-10-CM | POA: Diagnosis not present

## 2014-03-22 DIAGNOSIS — Z8673 Personal history of transient ischemic attack (TIA), and cerebral infarction without residual deficits: Secondary | ICD-10-CM | POA: Diagnosis not present

## 2014-03-22 DIAGNOSIS — I10 Essential (primary) hypertension: Secondary | ICD-10-CM | POA: Diagnosis not present

## 2014-03-22 DIAGNOSIS — F17200 Nicotine dependence, unspecified, uncomplicated: Secondary | ICD-10-CM | POA: Diagnosis not present

## 2014-03-22 DIAGNOSIS — I509 Heart failure, unspecified: Secondary | ICD-10-CM | POA: Diagnosis not present

## 2014-04-02 DIAGNOSIS — N2581 Secondary hyperparathyroidism of renal origin: Secondary | ICD-10-CM | POA: Diagnosis not present

## 2014-04-02 DIAGNOSIS — N039 Chronic nephritic syndrome with unspecified morphologic changes: Secondary | ICD-10-CM | POA: Diagnosis not present

## 2014-04-02 DIAGNOSIS — N183 Chronic kidney disease, stage 3 unspecified: Secondary | ICD-10-CM | POA: Diagnosis not present

## 2014-04-02 DIAGNOSIS — D631 Anemia in chronic kidney disease: Secondary | ICD-10-CM | POA: Diagnosis not present

## 2014-04-02 DIAGNOSIS — I10 Essential (primary) hypertension: Secondary | ICD-10-CM | POA: Diagnosis not present

## 2014-04-19 DIAGNOSIS — N039 Chronic nephritic syndrome with unspecified morphologic changes: Secondary | ICD-10-CM | POA: Diagnosis not present

## 2014-04-19 DIAGNOSIS — N2581 Secondary hyperparathyroidism of renal origin: Secondary | ICD-10-CM | POA: Diagnosis not present

## 2014-04-19 DIAGNOSIS — I10 Essential (primary) hypertension: Secondary | ICD-10-CM | POA: Diagnosis not present

## 2014-04-19 DIAGNOSIS — D631 Anemia in chronic kidney disease: Secondary | ICD-10-CM | POA: Diagnosis not present

## 2014-04-19 DIAGNOSIS — N184 Chronic kidney disease, stage 4 (severe): Secondary | ICD-10-CM | POA: Diagnosis not present

## 2014-05-17 DIAGNOSIS — IMO0002 Reserved for concepts with insufficient information to code with codable children: Secondary | ICD-10-CM | POA: Diagnosis not present

## 2014-05-24 DIAGNOSIS — R809 Proteinuria, unspecified: Secondary | ICD-10-CM | POA: Diagnosis not present

## 2014-05-24 DIAGNOSIS — N2581 Secondary hyperparathyroidism of renal origin: Secondary | ICD-10-CM | POA: Diagnosis not present

## 2014-05-24 DIAGNOSIS — D631 Anemia in chronic kidney disease: Secondary | ICD-10-CM | POA: Diagnosis not present

## 2014-05-24 DIAGNOSIS — I10 Essential (primary) hypertension: Secondary | ICD-10-CM | POA: Diagnosis not present

## 2014-05-24 DIAGNOSIS — N184 Chronic kidney disease, stage 4 (severe): Secondary | ICD-10-CM | POA: Diagnosis not present

## 2014-07-09 DIAGNOSIS — I429 Cardiomyopathy, unspecified: Secondary | ICD-10-CM | POA: Diagnosis not present

## 2014-07-09 DIAGNOSIS — I10 Essential (primary) hypertension: Secondary | ICD-10-CM | POA: Diagnosis not present

## 2014-07-09 DIAGNOSIS — R55 Syncope and collapse: Secondary | ICD-10-CM | POA: Diagnosis not present

## 2014-07-09 DIAGNOSIS — E119 Type 2 diabetes mellitus without complications: Secondary | ICD-10-CM | POA: Diagnosis not present

## 2014-08-01 DIAGNOSIS — M79609 Pain in unspecified limb: Secondary | ICD-10-CM | POA: Diagnosis not present

## 2014-08-01 DIAGNOSIS — F0390 Unspecified dementia without behavioral disturbance: Secondary | ICD-10-CM | POA: Diagnosis not present

## 2014-08-31 ENCOUNTER — Emergency Department: Payer: Self-pay | Admitting: Emergency Medicine

## 2014-08-31 DIAGNOSIS — R42 Dizziness and giddiness: Secondary | ICD-10-CM | POA: Diagnosis not present

## 2014-08-31 DIAGNOSIS — Z79899 Other long term (current) drug therapy: Secondary | ICD-10-CM | POA: Diagnosis not present

## 2014-08-31 DIAGNOSIS — Z7982 Long term (current) use of aspirin: Secondary | ICD-10-CM | POA: Diagnosis not present

## 2014-08-31 DIAGNOSIS — I10 Essential (primary) hypertension: Secondary | ICD-10-CM | POA: Diagnosis not present

## 2014-08-31 DIAGNOSIS — G8190 Hemiplegia, unspecified affecting unspecified side: Secondary | ICD-10-CM | POA: Diagnosis not present

## 2014-08-31 DIAGNOSIS — R5381 Other malaise: Secondary | ICD-10-CM | POA: Diagnosis not present

## 2014-08-31 DIAGNOSIS — E860 Dehydration: Secondary | ICD-10-CM | POA: Diagnosis not present

## 2014-08-31 DIAGNOSIS — F17200 Nicotine dependence, unspecified, uncomplicated: Secondary | ICD-10-CM | POA: Diagnosis not present

## 2014-08-31 DIAGNOSIS — R5383 Other fatigue: Secondary | ICD-10-CM | POA: Diagnosis not present

## 2014-08-31 DIAGNOSIS — R55 Syncope and collapse: Secondary | ICD-10-CM | POA: Diagnosis not present

## 2014-08-31 LAB — COMPREHENSIVE METABOLIC PANEL
ALBUMIN: 3.1 g/dL — AB (ref 3.4–5.0)
Alkaline Phosphatase: 81 U/L
Anion Gap: 4 — ABNORMAL LOW (ref 7–16)
BUN: 50 mg/dL — AB (ref 7–18)
Bilirubin,Total: 0.3 mg/dL (ref 0.2–1.0)
CALCIUM: 9.3 mg/dL (ref 8.5–10.1)
CHLORIDE: 113 mmol/L — AB (ref 98–107)
CO2: 25 mmol/L (ref 21–32)
CREATININE: 3.1 mg/dL — AB (ref 0.60–1.30)
EGFR (Non-African Amer.): 20 — ABNORMAL LOW
GFR CALC AF AMER: 23 — AB
Glucose: 97 mg/dL (ref 65–99)
Osmolality: 296 (ref 275–301)
Potassium: 5 mmol/L (ref 3.5–5.1)
SGOT(AST): 19 U/L (ref 15–37)
SGPT (ALT): 18 U/L
Sodium: 142 mmol/L (ref 136–145)
Total Protein: 7.9 g/dL (ref 6.4–8.2)

## 2014-08-31 LAB — URINALYSIS, COMPLETE
BLOOD: NEGATIVE
Bacteria: NONE SEEN
Bilirubin,UR: NEGATIVE
GLUCOSE, UR: NEGATIVE mg/dL (ref 0–75)
Hyaline Cast: 7
Ketone: NEGATIVE
Leukocyte Esterase: NEGATIVE
NITRITE: NEGATIVE
PROTEIN: NEGATIVE
Ph: 5 (ref 4.5–8.0)
RBC,UR: 1 /HPF (ref 0–5)
SPECIFIC GRAVITY: 1.017 (ref 1.003–1.030)

## 2014-08-31 LAB — CBC
HCT: 34.3 % — AB (ref 40.0–52.0)
HGB: 10.6 g/dL — AB (ref 13.0–18.0)
MCH: 27.3 pg (ref 26.0–34.0)
MCHC: 31 g/dL — ABNORMAL LOW (ref 32.0–36.0)
MCV: 88 fL (ref 80–100)
Platelet: 231 10*3/uL (ref 150–440)
RBC: 3.89 10*6/uL — AB (ref 4.40–5.90)
RDW: 13.9 % (ref 11.5–14.5)
WBC: 4.8 10*3/uL (ref 3.8–10.6)

## 2014-08-31 LAB — TROPONIN I: Troponin-I: 0.06 ng/mL — ABNORMAL HIGH

## 2014-10-17 ENCOUNTER — Emergency Department: Payer: Self-pay | Admitting: Emergency Medicine

## 2014-10-17 DIAGNOSIS — Z720 Tobacco use: Secondary | ICD-10-CM | POA: Diagnosis not present

## 2014-10-17 DIAGNOSIS — I10 Essential (primary) hypertension: Secondary | ICD-10-CM | POA: Diagnosis not present

## 2014-10-17 DIAGNOSIS — R778 Other specified abnormalities of plasma proteins: Secondary | ICD-10-CM | POA: Diagnosis not present

## 2014-10-17 DIAGNOSIS — R079 Chest pain, unspecified: Secondary | ICD-10-CM | POA: Diagnosis not present

## 2014-10-17 DIAGNOSIS — F17290 Nicotine dependence, other tobacco product, uncomplicated: Secondary | ICD-10-CM | POA: Diagnosis not present

## 2014-10-17 DIAGNOSIS — R55 Syncope and collapse: Secondary | ICD-10-CM | POA: Diagnosis not present

## 2014-10-17 LAB — CBC
HCT: 36.3 % — AB (ref 40.0–52.0)
HGB: 11.6 g/dL — AB (ref 13.0–18.0)
MCH: 28.4 pg (ref 26.0–34.0)
MCHC: 31.9 g/dL — AB (ref 32.0–36.0)
MCV: 89 fL (ref 80–100)
PLATELETS: 209 10*3/uL (ref 150–440)
RBC: 4.09 10*6/uL — AB (ref 4.40–5.90)
RDW: 14.7 % — AB (ref 11.5–14.5)
WBC: 5.5 10*3/uL (ref 3.8–10.6)

## 2014-10-17 LAB — BASIC METABOLIC PANEL
Anion Gap: 7 (ref 7–16)
BUN: 25 mg/dL — AB (ref 7–18)
CO2: 27 mmol/L (ref 21–32)
Calcium, Total: 8.6 mg/dL (ref 8.5–10.1)
Chloride: 106 mmol/L (ref 98–107)
Creatinine: 2.03 mg/dL — ABNORMAL HIGH (ref 0.60–1.30)
EGFR (Non-African Amer.): 35 — ABNORMAL LOW
GFR CALC AF AMER: 42 — AB
Glucose: 127 mg/dL — ABNORMAL HIGH (ref 65–99)
OSMOLALITY: 285 (ref 275–301)
POTASSIUM: 4.3 mmol/L (ref 3.5–5.1)
SODIUM: 140 mmol/L (ref 136–145)

## 2014-10-17 LAB — PROTIME-INR
INR: 0.9
PROTHROMBIN TIME: 12.5 s (ref 11.5–14.7)

## 2014-10-17 LAB — TROPONIN I: Troponin-I: 0.05 ng/mL

## 2014-10-17 LAB — PRO B NATRIURETIC PEPTIDE: B-Type Natriuretic Peptide: 2575 pg/mL — ABNORMAL HIGH (ref 0–125)

## 2014-11-12 ENCOUNTER — Emergency Department: Payer: Self-pay | Admitting: Emergency Medicine

## 2014-11-12 DIAGNOSIS — I10 Essential (primary) hypertension: Secondary | ICD-10-CM | POA: Diagnosis not present

## 2014-11-12 DIAGNOSIS — Z720 Tobacco use: Secondary | ICD-10-CM | POA: Diagnosis not present

## 2014-11-12 DIAGNOSIS — K8020 Calculus of gallbladder without cholecystitis without obstruction: Secondary | ICD-10-CM | POA: Diagnosis not present

## 2014-11-12 DIAGNOSIS — Z79899 Other long term (current) drug therapy: Secondary | ICD-10-CM | POA: Diagnosis not present

## 2014-11-12 DIAGNOSIS — K5900 Constipation, unspecified: Secondary | ICD-10-CM | POA: Diagnosis not present

## 2014-11-12 DIAGNOSIS — R109 Unspecified abdominal pain: Secondary | ICD-10-CM | POA: Diagnosis not present

## 2014-11-12 DIAGNOSIS — R42 Dizziness and giddiness: Secondary | ICD-10-CM | POA: Diagnosis not present

## 2014-11-12 DIAGNOSIS — Z7982 Long term (current) use of aspirin: Secondary | ICD-10-CM | POA: Diagnosis not present

## 2014-11-12 LAB — COMPREHENSIVE METABOLIC PANEL
ALBUMIN: 3.6 g/dL (ref 3.4–5.0)
ALK PHOS: 118 U/L — AB
ALT: 28 U/L
ANION GAP: 12 (ref 7–16)
BUN: 33 mg/dL — AB (ref 7–18)
Bilirubin,Total: 0.3 mg/dL (ref 0.2–1.0)
Calcium, Total: 9.5 mg/dL (ref 8.5–10.1)
Chloride: 103 mmol/L (ref 98–107)
Co2: 21 mmol/L (ref 21–32)
Creatinine: 2.06 mg/dL — ABNORMAL HIGH (ref 0.60–1.30)
EGFR (African American): 42 — ABNORMAL LOW
EGFR (Non-African Amer.): 34 — ABNORMAL LOW
GLUCOSE: 210 mg/dL — AB (ref 65–99)
OSMOLALITY: 285 (ref 275–301)
POTASSIUM: 4.6 mmol/L (ref 3.5–5.1)
SGOT(AST): 31 U/L (ref 15–37)
Sodium: 136 mmol/L (ref 136–145)
Total Protein: 9.2 g/dL — ABNORMAL HIGH (ref 6.4–8.2)

## 2014-11-12 LAB — URINALYSIS, COMPLETE
BILIRUBIN, UR: NEGATIVE
Bacteria: NONE SEEN
Glucose,UR: 150 mg/dL (ref 0–75)
Ketone: NEGATIVE
Leukocyte Esterase: NEGATIVE
Nitrite: NEGATIVE
Ph: 5 (ref 4.5–8.0)
Protein: 100
RBC,UR: 1 /HPF (ref 0–5)
SPECIFIC GRAVITY: 1.016 (ref 1.003–1.030)
Squamous Epithelial: 2

## 2014-11-12 LAB — CBC WITH DIFFERENTIAL/PLATELET
BASOS PCT: 0.4 %
Basophil #: 0 10*3/uL (ref 0.0–0.1)
EOS ABS: 0 10*3/uL (ref 0.0–0.7)
Eosinophil %: 0 %
HCT: 39.5 % — ABNORMAL LOW (ref 40.0–52.0)
HGB: 12.5 g/dL — AB (ref 13.0–18.0)
LYMPHS ABS: 0.8 10*3/uL — AB (ref 1.0–3.6)
Lymphocyte %: 16.4 %
MCH: 28.1 pg (ref 26.0–34.0)
MCHC: 31.7 g/dL — ABNORMAL LOW (ref 32.0–36.0)
MCV: 89 fL (ref 80–100)
Monocyte #: 0.3 x10 3/mm (ref 0.2–1.0)
Monocyte %: 7.4 %
Neutrophil #: 3.6 10*3/uL (ref 1.4–6.5)
Neutrophil %: 75.8 %
Platelet: 231 10*3/uL (ref 150–440)
RBC: 4.45 10*6/uL (ref 4.40–5.90)
RDW: 13.8 % (ref 11.5–14.5)
WBC: 4.7 10*3/uL (ref 3.8–10.6)

## 2014-11-12 LAB — LIPASE, BLOOD: LIPASE: 283 U/L (ref 73–393)

## 2014-11-15 DIAGNOSIS — I10 Essential (primary) hypertension: Secondary | ICD-10-CM | POA: Diagnosis not present

## 2014-11-15 DIAGNOSIS — N390 Urinary tract infection, site not specified: Secondary | ICD-10-CM | POA: Diagnosis not present

## 2014-11-15 DIAGNOSIS — R55 Syncope and collapse: Secondary | ICD-10-CM | POA: Diagnosis not present

## 2014-11-15 DIAGNOSIS — R778 Other specified abnormalities of plasma proteins: Secondary | ICD-10-CM | POA: Diagnosis not present

## 2014-12-12 ENCOUNTER — Inpatient Hospital Stay: Payer: Self-pay | Admitting: Internal Medicine

## 2014-12-12 DIAGNOSIS — R69 Illness, unspecified: Secondary | ICD-10-CM | POA: Diagnosis not present

## 2014-12-12 DIAGNOSIS — R600 Localized edema: Secondary | ICD-10-CM | POA: Diagnosis not present

## 2014-12-12 DIAGNOSIS — R509 Fever, unspecified: Secondary | ICD-10-CM | POA: Diagnosis not present

## 2014-12-12 DIAGNOSIS — I5022 Chronic systolic (congestive) heart failure: Secondary | ICD-10-CM | POA: Diagnosis not present

## 2014-12-12 DIAGNOSIS — S4991XA Unspecified injury of right shoulder and upper arm, initial encounter: Secondary | ICD-10-CM | POA: Diagnosis not present

## 2014-12-12 DIAGNOSIS — M25511 Pain in right shoulder: Secondary | ICD-10-CM | POA: Diagnosis not present

## 2014-12-12 DIAGNOSIS — L03115 Cellulitis of right lower limb: Secondary | ICD-10-CM | POA: Diagnosis not present

## 2014-12-12 DIAGNOSIS — R410 Disorientation, unspecified: Secondary | ICD-10-CM | POA: Diagnosis not present

## 2014-12-12 DIAGNOSIS — R4182 Altered mental status, unspecified: Secondary | ICD-10-CM | POA: Diagnosis not present

## 2014-12-12 DIAGNOSIS — A419 Sepsis, unspecified organism: Secondary | ICD-10-CM | POA: Diagnosis not present

## 2014-12-12 LAB — CBC WITH DIFFERENTIAL/PLATELET
BASOS ABS: 0 10*3/uL (ref 0.0–0.1)
Basophil %: 0 %
EOS PCT: 0.3 %
Eosinophil #: 0 10*3/uL (ref 0.0–0.7)
HCT: 34.2 % — AB (ref 40.0–52.0)
HGB: 10.7 g/dL — AB (ref 13.0–18.0)
LYMPHS PCT: 8.6 %
Lymphocyte #: 1.3 10*3/uL (ref 1.0–3.6)
MCH: 27.3 pg (ref 26.0–34.0)
MCHC: 31.4 g/dL — ABNORMAL LOW (ref 32.0–36.0)
MCV: 87 fL (ref 80–100)
Monocyte #: 1 x10 3/mm (ref 0.2–1.0)
Monocyte %: 7 %
Neutrophil #: 12.5 10*3/uL — ABNORMAL HIGH (ref 1.4–6.5)
Neutrophil %: 84.1 %
PLATELETS: 269 10*3/uL (ref 150–440)
RBC: 3.93 10*6/uL — ABNORMAL LOW (ref 4.40–5.90)
RDW: 13.7 % (ref 11.5–14.5)
WBC: 14.8 10*3/uL — ABNORMAL HIGH (ref 3.8–10.6)

## 2014-12-12 LAB — PROTIME-INR
INR: 1
Prothrombin Time: 12.9 secs (ref 11.5–14.7)

## 2014-12-12 LAB — URINALYSIS, COMPLETE
BILIRUBIN, UR: NEGATIVE
Bacteria: NONE SEEN
Ketone: NEGATIVE
Leukocyte Esterase: NEGATIVE
Nitrite: NEGATIVE
Ph: 7 (ref 4.5–8.0)
RBC,UR: 1 /HPF (ref 0–5)
Specific Gravity: 1.016 (ref 1.003–1.030)
Squamous Epithelial: <1

## 2014-12-12 LAB — COMPREHENSIVE METABOLIC PANEL
Albumin: 3.1 g/dL — ABNORMAL LOW (ref 3.4–5.0)
Alkaline Phosphatase: 99 U/L
Anion Gap: 9 (ref 7–16)
BUN: 27 mg/dL — AB (ref 7–18)
Bilirubin,Total: 0.3 mg/dL (ref 0.2–1.0)
CO2: 22 mmol/L (ref 21–32)
Calcium, Total: 8.7 mg/dL (ref 8.5–10.1)
Chloride: 103 mmol/L (ref 98–107)
Creatinine: 2.26 mg/dL — ABNORMAL HIGH (ref 0.60–1.30)
EGFR (African American): 37 — ABNORMAL LOW
EGFR (Non-African Amer.): 31 — ABNORMAL LOW
Glucose: 108 mg/dL — ABNORMAL HIGH (ref 65–99)
Osmolality: 274 (ref 275–301)
POTASSIUM: 4.9 mmol/L (ref 3.5–5.1)
SGOT(AST): 28 U/L (ref 15–37)
SGPT (ALT): 28 U/L
Sodium: 134 mmol/L — ABNORMAL LOW (ref 136–145)
TOTAL PROTEIN: 8.3 g/dL — AB (ref 6.4–8.2)

## 2014-12-12 LAB — TROPONIN I: Troponin-I: 0.05 ng/mL

## 2014-12-12 LAB — MAGNESIUM: Magnesium: 1.4 mg/dL — ABNORMAL LOW

## 2014-12-13 DIAGNOSIS — A419 Sepsis, unspecified organism: Secondary | ICD-10-CM | POA: Diagnosis not present

## 2014-12-13 DIAGNOSIS — R410 Disorientation, unspecified: Secondary | ICD-10-CM | POA: Diagnosis not present

## 2014-12-13 DIAGNOSIS — L03115 Cellulitis of right lower limb: Secondary | ICD-10-CM | POA: Diagnosis not present

## 2014-12-13 DIAGNOSIS — M25511 Pain in right shoulder: Secondary | ICD-10-CM | POA: Diagnosis not present

## 2014-12-13 DIAGNOSIS — I5022 Chronic systolic (congestive) heart failure: Secondary | ICD-10-CM | POA: Diagnosis not present

## 2014-12-13 LAB — CBC WITH DIFFERENTIAL/PLATELET
Basophil #: 0 10*3/uL (ref 0.0–0.1)
Basophil %: 0.4 %
Eosinophil #: 0 10*3/uL (ref 0.0–0.7)
Eosinophil %: 0.4 %
HCT: 29.2 % — ABNORMAL LOW (ref 40.0–52.0)
HGB: 9.2 g/dL — AB (ref 13.0–18.0)
Lymphocyte #: 1.8 10*3/uL (ref 1.0–3.6)
Lymphocyte %: 16.2 %
MCH: 27.6 pg (ref 26.0–34.0)
MCHC: 31.5 g/dL — AB (ref 32.0–36.0)
MCV: 88 fL (ref 80–100)
MONOS PCT: 13.1 %
Monocyte #: 1.5 x10 3/mm — ABNORMAL HIGH (ref 0.2–1.0)
NEUTROS PCT: 69.9 %
Neutrophil #: 8 10*3/uL — ABNORMAL HIGH (ref 1.4–6.5)
PLATELETS: 226 10*3/uL (ref 150–440)
RBC: 3.34 10*6/uL — ABNORMAL LOW (ref 4.40–5.90)
RDW: 13.5 % (ref 11.5–14.5)
WBC: 11.4 10*3/uL — ABNORMAL HIGH (ref 3.8–10.6)

## 2014-12-13 LAB — BASIC METABOLIC PANEL
ANION GAP: 6 — AB (ref 7–16)
BUN: 27 mg/dL — AB (ref 7–18)
CO2: 22 mmol/L (ref 21–32)
Calcium, Total: 8.4 mg/dL — ABNORMAL LOW (ref 8.5–10.1)
Chloride: 108 mmol/L — ABNORMAL HIGH (ref 98–107)
Creatinine: 2.48 mg/dL — ABNORMAL HIGH (ref 0.60–1.30)
GFR CALC AF AMER: 34 — AB
GFR CALC NON AF AMER: 28 — AB
Glucose: 97 mg/dL (ref 65–99)
OSMOLALITY: 277 (ref 275–301)
Potassium: 3.9 mmol/L (ref 3.5–5.1)
SODIUM: 136 mmol/L (ref 136–145)

## 2014-12-13 LAB — MAGNESIUM: Magnesium: 1.7 mg/dL — ABNORMAL LOW

## 2014-12-14 DIAGNOSIS — I5022 Chronic systolic (congestive) heart failure: Secondary | ICD-10-CM | POA: Diagnosis not present

## 2014-12-14 DIAGNOSIS — L03115 Cellulitis of right lower limb: Secondary | ICD-10-CM | POA: Diagnosis not present

## 2014-12-14 DIAGNOSIS — A419 Sepsis, unspecified organism: Secondary | ICD-10-CM | POA: Diagnosis not present

## 2014-12-14 DIAGNOSIS — M25511 Pain in right shoulder: Secondary | ICD-10-CM | POA: Diagnosis not present

## 2014-12-14 DIAGNOSIS — R410 Disorientation, unspecified: Secondary | ICD-10-CM | POA: Diagnosis not present

## 2014-12-14 LAB — MAGNESIUM: MAGNESIUM: 2.2 mg/dL

## 2014-12-14 LAB — BASIC METABOLIC PANEL
Anion Gap: 7 (ref 7–16)
BUN: 26 mg/dL — AB (ref 7–18)
Calcium, Total: 8.3 mg/dL — ABNORMAL LOW (ref 8.5–10.1)
Chloride: 109 mmol/L — ABNORMAL HIGH (ref 98–107)
Co2: 22 mmol/L (ref 21–32)
Creatinine: 2.5 mg/dL — ABNORMAL HIGH (ref 0.60–1.30)
EGFR (African American): 33 — ABNORMAL LOW
GFR CALC NON AF AMER: 28 — AB
GLUCOSE: 99 mg/dL (ref 65–99)
Osmolality: 280 (ref 275–301)
POTASSIUM: 3.8 mmol/L (ref 3.5–5.1)
Sodium: 138 mmol/L (ref 136–145)

## 2014-12-14 LAB — URINE CULTURE

## 2014-12-15 DIAGNOSIS — I5022 Chronic systolic (congestive) heart failure: Secondary | ICD-10-CM | POA: Diagnosis not present

## 2014-12-15 DIAGNOSIS — L03115 Cellulitis of right lower limb: Secondary | ICD-10-CM | POA: Diagnosis not present

## 2014-12-15 DIAGNOSIS — R410 Disorientation, unspecified: Secondary | ICD-10-CM | POA: Diagnosis not present

## 2014-12-15 DIAGNOSIS — M25511 Pain in right shoulder: Secondary | ICD-10-CM | POA: Diagnosis not present

## 2014-12-15 DIAGNOSIS — A419 Sepsis, unspecified organism: Secondary | ICD-10-CM | POA: Diagnosis not present

## 2014-12-15 LAB — CBC WITH DIFFERENTIAL/PLATELET
BASOS PCT: 0.4 %
Basophil #: 0 10*3/uL (ref 0.0–0.1)
EOS ABS: 0.3 10*3/uL (ref 0.0–0.7)
EOS PCT: 3.7 %
HCT: 31.2 % — AB (ref 40.0–52.0)
HGB: 9.8 g/dL — AB (ref 13.0–18.0)
LYMPHS PCT: 27.8 %
Lymphocyte #: 2.1 10*3/uL (ref 1.0–3.6)
MCH: 27.5 pg (ref 26.0–34.0)
MCHC: 31.4 g/dL — AB (ref 32.0–36.0)
MCV: 88 fL (ref 80–100)
MONOS PCT: 19.5 %
Monocyte #: 1.4 x10 3/mm — ABNORMAL HIGH (ref 0.2–1.0)
Neutrophil #: 3.6 10*3/uL (ref 1.4–6.5)
Neutrophil %: 48.6 %
Platelet: 221 10*3/uL (ref 150–440)
RBC: 3.57 10*6/uL — ABNORMAL LOW (ref 4.40–5.90)
RDW: 13.8 % (ref 11.5–14.5)
WBC: 7.4 10*3/uL (ref 3.8–10.6)

## 2014-12-15 LAB — BASIC METABOLIC PANEL
Anion Gap: 8 (ref 7–16)
BUN: 21 mg/dL — ABNORMAL HIGH (ref 7–18)
CALCIUM: 8.4 mg/dL — AB (ref 8.5–10.1)
CHLORIDE: 110 mmol/L — AB (ref 98–107)
CO2: 22 mmol/L (ref 21–32)
Creatinine: 2.39 mg/dL — ABNORMAL HIGH (ref 0.60–1.30)
EGFR (African American): 35 — ABNORMAL LOW
GFR CALC NON AF AMER: 29 — AB
Glucose: 96 mg/dL (ref 65–99)
OSMOLALITY: 282 (ref 275–301)
POTASSIUM: 4.1 mmol/L (ref 3.5–5.1)
Sodium: 140 mmol/L (ref 136–145)

## 2014-12-16 DIAGNOSIS — A419 Sepsis, unspecified organism: Secondary | ICD-10-CM | POA: Diagnosis not present

## 2014-12-16 DIAGNOSIS — I5022 Chronic systolic (congestive) heart failure: Secondary | ICD-10-CM | POA: Diagnosis not present

## 2014-12-16 DIAGNOSIS — R410 Disorientation, unspecified: Secondary | ICD-10-CM | POA: Diagnosis not present

## 2014-12-16 DIAGNOSIS — M25511 Pain in right shoulder: Secondary | ICD-10-CM | POA: Diagnosis not present

## 2014-12-16 DIAGNOSIS — L03115 Cellulitis of right lower limb: Secondary | ICD-10-CM | POA: Diagnosis not present

## 2014-12-17 LAB — CULTURE, BLOOD (SINGLE)

## 2014-12-20 DIAGNOSIS — N390 Urinary tract infection, site not specified: Secondary | ICD-10-CM | POA: Diagnosis not present

## 2014-12-20 DIAGNOSIS — I10 Essential (primary) hypertension: Secondary | ICD-10-CM | POA: Diagnosis not present

## 2014-12-20 DIAGNOSIS — R55 Syncope and collapse: Secondary | ICD-10-CM | POA: Diagnosis not present

## 2014-12-20 DIAGNOSIS — R778 Other specified abnormalities of plasma proteins: Secondary | ICD-10-CM | POA: Diagnosis not present

## 2015-01-03 DIAGNOSIS — I129 Hypertensive chronic kidney disease with stage 1 through stage 4 chronic kidney disease, or unspecified chronic kidney disease: Secondary | ICD-10-CM | POA: Diagnosis not present

## 2015-01-03 DIAGNOSIS — I509 Heart failure, unspecified: Secondary | ICD-10-CM | POA: Diagnosis not present

## 2015-01-03 DIAGNOSIS — N3944 Nocturnal enuresis: Secondary | ICD-10-CM | POA: Diagnosis not present

## 2015-01-03 DIAGNOSIS — N183 Chronic kidney disease, stage 3 (moderate): Secondary | ICD-10-CM | POA: Diagnosis not present

## 2015-01-03 DIAGNOSIS — L03115 Cellulitis of right lower limb: Secondary | ICD-10-CM | POA: Diagnosis not present

## 2015-01-17 ENCOUNTER — Emergency Department: Payer: Self-pay | Admitting: Emergency Medicine

## 2015-01-17 DIAGNOSIS — R531 Weakness: Secondary | ICD-10-CM | POA: Diagnosis not present

## 2015-01-17 DIAGNOSIS — I10 Essential (primary) hypertension: Secondary | ICD-10-CM | POA: Diagnosis not present

## 2015-01-17 DIAGNOSIS — R569 Unspecified convulsions: Secondary | ICD-10-CM | POA: Diagnosis not present

## 2015-01-17 DIAGNOSIS — G319 Degenerative disease of nervous system, unspecified: Secondary | ICD-10-CM | POA: Diagnosis not present

## 2015-01-17 DIAGNOSIS — R55 Syncope and collapse: Secondary | ICD-10-CM | POA: Diagnosis not present

## 2015-01-17 DIAGNOSIS — I63342 Cerebral infarction due to thrombosis of left cerebellar artery: Secondary | ICD-10-CM | POA: Diagnosis not present

## 2015-01-17 DIAGNOSIS — E162 Hypoglycemia, unspecified: Secondary | ICD-10-CM | POA: Diagnosis not present

## 2015-01-17 DIAGNOSIS — Z720 Tobacco use: Secondary | ICD-10-CM | POA: Diagnosis not present

## 2015-01-17 DIAGNOSIS — I517 Cardiomegaly: Secondary | ICD-10-CM | POA: Diagnosis not present

## 2015-01-17 DIAGNOSIS — R9082 White matter disease, unspecified: Secondary | ICD-10-CM | POA: Diagnosis not present

## 2015-01-17 DIAGNOSIS — R401 Stupor: Secondary | ICD-10-CM | POA: Diagnosis not present

## 2015-01-17 DIAGNOSIS — I429 Cardiomyopathy, unspecified: Secondary | ICD-10-CM | POA: Diagnosis not present

## 2015-01-17 LAB — CK TOTAL AND CKMB (NOT AT ARMC)
CK, TOTAL: 105 U/L (ref 39–308)
CK-MB: 1 ng/mL (ref 0.5–3.6)

## 2015-01-17 LAB — COMPREHENSIVE METABOLIC PANEL
ALBUMIN: 2.8 g/dL — AB (ref 3.4–5.0)
ALK PHOS: 113 U/L (ref 46–116)
AST: 57 U/L — AB (ref 15–37)
Anion Gap: 6 — ABNORMAL LOW (ref 7–16)
BUN: 23 mg/dL — AB (ref 7–18)
Bilirubin,Total: 0.2 mg/dL (ref 0.2–1.0)
CALCIUM: 8.6 mg/dL (ref 8.5–10.1)
Chloride: 110 mmol/L — ABNORMAL HIGH (ref 98–107)
Co2: 24 mmol/L (ref 21–32)
Creatinine: 2.61 mg/dL — ABNORMAL HIGH (ref 0.60–1.30)
EGFR (African American): 32 — ABNORMAL LOW
EGFR (Non-African Amer.): 26 — ABNORMAL LOW
GLUCOSE: 133 mg/dL — AB (ref 65–99)
OSMOLALITY: 285 (ref 275–301)
POTASSIUM: 5 mmol/L (ref 3.5–5.1)
SGPT (ALT): 46 U/L (ref 14–63)
SODIUM: 140 mmol/L (ref 136–145)
Total Protein: 7.5 g/dL (ref 6.4–8.2)

## 2015-01-17 LAB — URINALYSIS, COMPLETE
Bilirubin,UR: NEGATIVE
Blood: NEGATIVE
GLUCOSE, UR: NEGATIVE mg/dL (ref 0–75)
KETONE: NEGATIVE
LEUKOCYTE ESTERASE: NEGATIVE
Nitrite: NEGATIVE
Ph: 5 (ref 4.5–8.0)
Protein: NEGATIVE
RBC,UR: 1 /HPF (ref 0–5)
Specific Gravity: 1.017 (ref 1.003–1.030)
Squamous Epithelial: <1

## 2015-01-17 LAB — CBC
HCT: 33.8 % — ABNORMAL LOW (ref 40.0–52.0)
HGB: 10.7 g/dL — AB (ref 13.0–18.0)
MCH: 28.2 pg (ref 26.0–34.0)
MCHC: 31.8 g/dL — ABNORMAL LOW (ref 32.0–36.0)
MCV: 89 fL (ref 80–100)
Platelet: 186 10*3/uL (ref 150–440)
RBC: 3.81 10*6/uL — AB (ref 4.40–5.90)
RDW: 14.3 % (ref 11.5–14.5)
WBC: 6.3 10*3/uL (ref 3.8–10.6)

## 2015-01-17 LAB — CLOSTRIDIUM DIFFICILE(ARMC)

## 2015-01-17 LAB — TROPONIN I: TROPONIN-I: 0.04 ng/mL

## 2015-01-22 DIAGNOSIS — M19012 Primary osteoarthritis, left shoulder: Secondary | ICD-10-CM | POA: Diagnosis not present

## 2015-01-22 DIAGNOSIS — R55 Syncope and collapse: Secondary | ICD-10-CM | POA: Diagnosis not present

## 2015-01-22 DIAGNOSIS — E559 Vitamin D deficiency, unspecified: Secondary | ICD-10-CM | POA: Diagnosis not present

## 2015-01-22 DIAGNOSIS — D631 Anemia in chronic kidney disease: Secondary | ICD-10-CM | POA: Diagnosis not present

## 2015-01-22 DIAGNOSIS — R400 Somnolence: Secondary | ICD-10-CM | POA: Diagnosis not present

## 2015-01-22 DIAGNOSIS — Z1211 Encounter for screening for malignant neoplasm of colon: Secondary | ICD-10-CM | POA: Diagnosis not present

## 2015-01-22 DIAGNOSIS — R2990 Unspecified symptoms and signs involving the nervous system: Secondary | ICD-10-CM | POA: Diagnosis not present

## 2015-01-22 DIAGNOSIS — R197 Diarrhea, unspecified: Secondary | ICD-10-CM | POA: Diagnosis not present

## 2015-01-22 DIAGNOSIS — M19011 Primary osteoarthritis, right shoulder: Secondary | ICD-10-CM | POA: Diagnosis not present

## 2015-01-22 DIAGNOSIS — N183 Chronic kidney disease, stage 3 (moderate): Secondary | ICD-10-CM | POA: Diagnosis not present

## 2015-01-31 DIAGNOSIS — R55 Syncope and collapse: Secondary | ICD-10-CM | POA: Diagnosis not present

## 2015-02-04 DIAGNOSIS — E782 Mixed hyperlipidemia: Secondary | ICD-10-CM | POA: Diagnosis not present

## 2015-02-04 DIAGNOSIS — R55 Syncope and collapse: Secondary | ICD-10-CM | POA: Diagnosis not present

## 2015-02-04 DIAGNOSIS — I10 Essential (primary) hypertension: Secondary | ICD-10-CM | POA: Diagnosis not present

## 2015-02-04 DIAGNOSIS — I429 Cardiomyopathy, unspecified: Secondary | ICD-10-CM | POA: Diagnosis not present

## 2015-02-05 DIAGNOSIS — N183 Chronic kidney disease, stage 3 (moderate): Secondary | ICD-10-CM | POA: Diagnosis not present

## 2015-02-05 DIAGNOSIS — D509 Iron deficiency anemia, unspecified: Secondary | ICD-10-CM | POA: Diagnosis not present

## 2015-02-05 DIAGNOSIS — L0390 Cellulitis, unspecified: Secondary | ICD-10-CM | POA: Diagnosis not present

## 2015-02-12 DIAGNOSIS — R55 Syncope and collapse: Secondary | ICD-10-CM | POA: Diagnosis not present

## 2015-02-26 DIAGNOSIS — Z720 Tobacco use: Secondary | ICD-10-CM | POA: Insufficient documentation

## 2015-02-26 DIAGNOSIS — I10 Essential (primary) hypertension: Secondary | ICD-10-CM | POA: Diagnosis not present

## 2015-02-26 DIAGNOSIS — R29898 Other symptoms and signs involving the musculoskeletal system: Secondary | ICD-10-CM | POA: Insufficient documentation

## 2015-02-26 DIAGNOSIS — F17200 Nicotine dependence, unspecified, uncomplicated: Secondary | ICD-10-CM | POA: Insufficient documentation

## 2015-02-26 DIAGNOSIS — M6281 Muscle weakness (generalized): Secondary | ICD-10-CM | POA: Diagnosis not present

## 2015-02-26 DIAGNOSIS — E119 Type 2 diabetes mellitus without complications: Secondary | ICD-10-CM | POA: Diagnosis not present

## 2015-03-01 DIAGNOSIS — I10 Essential (primary) hypertension: Secondary | ICD-10-CM | POA: Diagnosis not present

## 2015-03-01 DIAGNOSIS — I2510 Atherosclerotic heart disease of native coronary artery without angina pectoris: Secondary | ICD-10-CM | POA: Diagnosis not present

## 2015-03-01 DIAGNOSIS — R55 Syncope and collapse: Secondary | ICD-10-CM | POA: Diagnosis not present

## 2015-03-01 DIAGNOSIS — I6523 Occlusion and stenosis of bilateral carotid arteries: Secondary | ICD-10-CM | POA: Diagnosis not present

## 2015-03-02 DIAGNOSIS — F17200 Nicotine dependence, unspecified, uncomplicated: Secondary | ICD-10-CM | POA: Diagnosis not present

## 2015-03-02 DIAGNOSIS — Z9119 Patient's noncompliance with other medical treatment and regimen: Secondary | ICD-10-CM | POA: Diagnosis not present

## 2015-03-02 DIAGNOSIS — R270 Ataxia, unspecified: Secondary | ICD-10-CM | POA: Diagnosis not present

## 2015-03-02 DIAGNOSIS — M6281 Muscle weakness (generalized): Secondary | ICD-10-CM | POA: Diagnosis not present

## 2015-03-02 DIAGNOSIS — Z9181 History of falling: Secondary | ICD-10-CM | POA: Diagnosis not present

## 2015-03-02 DIAGNOSIS — E1165 Type 2 diabetes mellitus with hyperglycemia: Secondary | ICD-10-CM | POA: Diagnosis not present

## 2015-03-02 DIAGNOSIS — F0390 Unspecified dementia without behavioral disturbance: Secondary | ICD-10-CM | POA: Diagnosis not present

## 2015-03-02 DIAGNOSIS — I10 Essential (primary) hypertension: Secondary | ICD-10-CM | POA: Diagnosis not present

## 2015-03-04 DIAGNOSIS — R55 Syncope and collapse: Secondary | ICD-10-CM | POA: Diagnosis not present

## 2015-03-04 DIAGNOSIS — I429 Cardiomyopathy, unspecified: Secondary | ICD-10-CM | POA: Diagnosis not present

## 2015-03-04 DIAGNOSIS — E782 Mixed hyperlipidemia: Secondary | ICD-10-CM | POA: Diagnosis not present

## 2015-03-04 DIAGNOSIS — I10 Essential (primary) hypertension: Secondary | ICD-10-CM | POA: Diagnosis not present

## 2015-03-04 LAB — HEMOGLOBIN A1C: Hgb A1c MFr Bld: 7.1 % — AB (ref 4.0–6.0)

## 2015-03-05 DIAGNOSIS — Z9181 History of falling: Secondary | ICD-10-CM | POA: Diagnosis not present

## 2015-03-05 DIAGNOSIS — E782 Mixed hyperlipidemia: Secondary | ICD-10-CM | POA: Diagnosis not present

## 2015-03-05 DIAGNOSIS — D631 Anemia in chronic kidney disease: Secondary | ICD-10-CM | POA: Diagnosis not present

## 2015-03-05 DIAGNOSIS — N183 Chronic kidney disease, stage 3 (moderate): Secondary | ICD-10-CM | POA: Diagnosis not present

## 2015-03-05 DIAGNOSIS — I129 Hypertensive chronic kidney disease with stage 1 through stage 4 chronic kidney disease, or unspecified chronic kidney disease: Secondary | ICD-10-CM | POA: Diagnosis not present

## 2015-03-05 DIAGNOSIS — E1121 Type 2 diabetes mellitus with diabetic nephropathy: Secondary | ICD-10-CM | POA: Diagnosis not present

## 2015-03-05 DIAGNOSIS — M1990 Unspecified osteoarthritis, unspecified site: Secondary | ICD-10-CM | POA: Diagnosis not present

## 2015-03-05 DIAGNOSIS — R6889 Other general symptoms and signs: Secondary | ICD-10-CM | POA: Diagnosis not present

## 2015-03-05 DIAGNOSIS — I509 Heart failure, unspecified: Secondary | ICD-10-CM | POA: Diagnosis not present

## 2015-03-07 DIAGNOSIS — E782 Mixed hyperlipidemia: Secondary | ICD-10-CM | POA: Diagnosis not present

## 2015-03-07 DIAGNOSIS — E559 Vitamin D deficiency, unspecified: Secondary | ICD-10-CM | POA: Diagnosis not present

## 2015-03-07 DIAGNOSIS — N183 Chronic kidney disease, stage 3 (moderate): Secondary | ICD-10-CM | POA: Diagnosis not present

## 2015-03-07 DIAGNOSIS — Z8521 Personal history of malignant neoplasm of larynx: Secondary | ICD-10-CM | POA: Diagnosis not present

## 2015-03-11 DIAGNOSIS — N2581 Secondary hyperparathyroidism of renal origin: Secondary | ICD-10-CM | POA: Diagnosis not present

## 2015-03-11 DIAGNOSIS — R809 Proteinuria, unspecified: Secondary | ICD-10-CM | POA: Diagnosis not present

## 2015-03-11 DIAGNOSIS — I10 Essential (primary) hypertension: Secondary | ICD-10-CM | POA: Diagnosis not present

## 2015-03-11 DIAGNOSIS — N184 Chronic kidney disease, stage 4 (severe): Secondary | ICD-10-CM | POA: Diagnosis not present

## 2015-03-11 DIAGNOSIS — D631 Anemia in chronic kidney disease: Secondary | ICD-10-CM | POA: Diagnosis not present

## 2015-03-11 DIAGNOSIS — E1122 Type 2 diabetes mellitus with diabetic chronic kidney disease: Secondary | ICD-10-CM | POA: Diagnosis not present

## 2015-03-12 DIAGNOSIS — M6281 Muscle weakness (generalized): Secondary | ICD-10-CM | POA: Diagnosis not present

## 2015-03-12 DIAGNOSIS — F0390 Unspecified dementia without behavioral disturbance: Secondary | ICD-10-CM | POA: Diagnosis not present

## 2015-03-12 DIAGNOSIS — E1165 Type 2 diabetes mellitus with hyperglycemia: Secondary | ICD-10-CM | POA: Diagnosis not present

## 2015-03-12 DIAGNOSIS — I10 Essential (primary) hypertension: Secondary | ICD-10-CM | POA: Diagnosis not present

## 2015-03-12 DIAGNOSIS — R270 Ataxia, unspecified: Secondary | ICD-10-CM | POA: Diagnosis not present

## 2015-03-12 DIAGNOSIS — F17200 Nicotine dependence, unspecified, uncomplicated: Secondary | ICD-10-CM | POA: Diagnosis not present

## 2015-03-14 DIAGNOSIS — R270 Ataxia, unspecified: Secondary | ICD-10-CM | POA: Diagnosis not present

## 2015-03-14 DIAGNOSIS — E1165 Type 2 diabetes mellitus with hyperglycemia: Secondary | ICD-10-CM | POA: Diagnosis not present

## 2015-03-14 DIAGNOSIS — I10 Essential (primary) hypertension: Secondary | ICD-10-CM | POA: Diagnosis not present

## 2015-03-14 DIAGNOSIS — M6281 Muscle weakness (generalized): Secondary | ICD-10-CM | POA: Diagnosis not present

## 2015-03-14 DIAGNOSIS — F17200 Nicotine dependence, unspecified, uncomplicated: Secondary | ICD-10-CM | POA: Diagnosis not present

## 2015-03-14 DIAGNOSIS — F0390 Unspecified dementia without behavioral disturbance: Secondary | ICD-10-CM | POA: Diagnosis not present

## 2015-03-19 DIAGNOSIS — E1165 Type 2 diabetes mellitus with hyperglycemia: Secondary | ICD-10-CM | POA: Diagnosis not present

## 2015-03-19 DIAGNOSIS — M6281 Muscle weakness (generalized): Secondary | ICD-10-CM | POA: Diagnosis not present

## 2015-03-19 DIAGNOSIS — F17200 Nicotine dependence, unspecified, uncomplicated: Secondary | ICD-10-CM | POA: Diagnosis not present

## 2015-03-19 DIAGNOSIS — R270 Ataxia, unspecified: Secondary | ICD-10-CM | POA: Diagnosis not present

## 2015-03-19 DIAGNOSIS — I10 Essential (primary) hypertension: Secondary | ICD-10-CM | POA: Diagnosis not present

## 2015-03-19 DIAGNOSIS — F0390 Unspecified dementia without behavioral disturbance: Secondary | ICD-10-CM | POA: Diagnosis not present

## 2015-03-21 DIAGNOSIS — F17200 Nicotine dependence, unspecified, uncomplicated: Secondary | ICD-10-CM | POA: Diagnosis not present

## 2015-03-21 DIAGNOSIS — I10 Essential (primary) hypertension: Secondary | ICD-10-CM | POA: Diagnosis not present

## 2015-03-21 DIAGNOSIS — R270 Ataxia, unspecified: Secondary | ICD-10-CM | POA: Diagnosis not present

## 2015-03-21 DIAGNOSIS — E1165 Type 2 diabetes mellitus with hyperglycemia: Secondary | ICD-10-CM | POA: Diagnosis not present

## 2015-03-21 DIAGNOSIS — F0390 Unspecified dementia without behavioral disturbance: Secondary | ICD-10-CM | POA: Diagnosis not present

## 2015-03-21 DIAGNOSIS — M6281 Muscle weakness (generalized): Secondary | ICD-10-CM | POA: Diagnosis not present

## 2015-03-26 DIAGNOSIS — I10 Essential (primary) hypertension: Secondary | ICD-10-CM | POA: Diagnosis not present

## 2015-03-26 DIAGNOSIS — M6281 Muscle weakness (generalized): Secondary | ICD-10-CM | POA: Diagnosis not present

## 2015-03-26 DIAGNOSIS — E1165 Type 2 diabetes mellitus with hyperglycemia: Secondary | ICD-10-CM | POA: Diagnosis not present

## 2015-03-26 DIAGNOSIS — F0390 Unspecified dementia without behavioral disturbance: Secondary | ICD-10-CM | POA: Diagnosis not present

## 2015-03-26 DIAGNOSIS — R270 Ataxia, unspecified: Secondary | ICD-10-CM | POA: Diagnosis not present

## 2015-03-26 DIAGNOSIS — F17200 Nicotine dependence, unspecified, uncomplicated: Secondary | ICD-10-CM | POA: Diagnosis not present

## 2015-03-29 DIAGNOSIS — F17200 Nicotine dependence, unspecified, uncomplicated: Secondary | ICD-10-CM | POA: Diagnosis not present

## 2015-03-29 DIAGNOSIS — F0390 Unspecified dementia without behavioral disturbance: Secondary | ICD-10-CM | POA: Diagnosis not present

## 2015-03-29 DIAGNOSIS — E1165 Type 2 diabetes mellitus with hyperglycemia: Secondary | ICD-10-CM | POA: Diagnosis not present

## 2015-03-29 DIAGNOSIS — R270 Ataxia, unspecified: Secondary | ICD-10-CM | POA: Diagnosis not present

## 2015-03-29 DIAGNOSIS — M6281 Muscle weakness (generalized): Secondary | ICD-10-CM | POA: Diagnosis not present

## 2015-03-29 DIAGNOSIS — I10 Essential (primary) hypertension: Secondary | ICD-10-CM | POA: Diagnosis not present

## 2015-04-01 DIAGNOSIS — E1165 Type 2 diabetes mellitus with hyperglycemia: Secondary | ICD-10-CM | POA: Diagnosis not present

## 2015-04-01 DIAGNOSIS — M6281 Muscle weakness (generalized): Secondary | ICD-10-CM | POA: Diagnosis not present

## 2015-04-01 DIAGNOSIS — F17200 Nicotine dependence, unspecified, uncomplicated: Secondary | ICD-10-CM | POA: Diagnosis not present

## 2015-04-01 DIAGNOSIS — I10 Essential (primary) hypertension: Secondary | ICD-10-CM | POA: Diagnosis not present

## 2015-04-01 DIAGNOSIS — R270 Ataxia, unspecified: Secondary | ICD-10-CM | POA: Diagnosis not present

## 2015-04-01 DIAGNOSIS — F0390 Unspecified dementia without behavioral disturbance: Secondary | ICD-10-CM | POA: Diagnosis not present

## 2015-04-05 NOTE — H&P (Signed)
PATIENT NAME:  Kyle Short, BRYER MR#:  [id number] DATE OF BIRTH:  03/26/1946  DATE OF ADMISSION:  09/12/2013  PRIMARY CARE PHYSICIAN: Nonlocal.  REFERRING PHYSICIAN:  Lavonia Drafts, MD  CHIEF COMPLAINT: Confusion and altered mental status.   HISTORY OF PRESENT ILLNESS: The patient is a 69 year old African American male with a history of hypertension, cardiomyopathy, smoker, noncompliance who was sent from nursing home due to confusion and weakness today. The patient is confused. He only complains of weakness. According to ED PA the patient was sent from nursing home due to confusion and decreased mental status. The patient's urinalysis in the ED showed UTI. The patient denies any fever or chills. No headache or dizziness. Denies any other symptoms.   PAST MEDICAL HISTORY: Hypertension, cardiomyopathy with ejection fraction 25%, noncompliance to medication, alcohol, smoker.   PAST SURGICAL HISTORY: Tonsillectomy.   SOCIAL HISTORY: Nursing home resident. Smokes. History of alcohol abuse.   FAMILY HISTORY: According to previous history, family history positive for hypertension, diabetes and CAD.  ALLERGIES: No known drug allergies.   HOME MEDICATIONS:  1.  Aspirin 81 mg p.o. daily. 2.  Atorvastatin 20 mg p.o. at bedtime. 3.  Coreg 12.5 mg p.o. b.i.d.  4.  Lasix 20 mg p.o. daily. 5.  Hydralazine 25 mg p.o. 4 times a day.  6.  Isosorbide mononitrate 30 mg p.o. daily. 7.  Trazodone 50 mg p.o. at bedtime.   REVIEW OF SYSTEMS: The patient is confused, only saying he has weakness. Denies any other symptoms. Not reliable.   PHYSICAL EXAMINATION: VITAL SIGNS: Temperature 97.5, blood pressure 124/71, pulse 54, O2 saturation 98% in room air.  GENERAL: The patient is alert, awake, oriented to his name and location, but does not know the time, in no acute distress.  HEENT: Pupils are round, equal and reactive to light and accommodation. Moist oral mucosa. Clear oropharynx.  NECK: Supple. No JVD  or carotid bruits. No lymphadenopathy. No thyromegaly.  HEART: S1 and S2, regular rate and rhythm. No murmurs or gallops.  LUNGS: Bilateral air entry. No wheezing or rales. No use of accessory muscles to breathe.  ABDOMEN: Soft. No distention or tenderness. No organomegaly. Bowel sounds present.  EXTREMITIES: No edema, clubbing or cyanosis. No calf tenderness. Strong bilateral pedal pulses.  SKIN: No rash or jaundice.  NEUROLOGIC: The patient is alert and awake, but confused. Follows commands. No focal deficit. Power 5/5. Sensation intact.   LABORATORY AND DIAGNOSTICS: Chest x-ray showed no evidence of acute cardiopulmonary abnormality.   WBC 4.6, hemoglobin 12.4, platelets 209. Glucose 166, BUN 32, creatinine 2.67, sodium 135, chloride 105, bicarb 24. Troponin 0.02. Alcohol level less than 3. Urinalysis showed RBC 71, WBCs 656 and nitrite negative.  CAT scan of head shows chronic changes without evidence of acute abnormality.   Chest x-ray: No acute cardiopulmonary disease.   EKG shows sinus bradycardia at 57 bpm.   IMPRESSIONS: 1.  Altered mental status, encephalopathy. Possibly due to urinary tract infection.  2.  Urinary tract infection.  3.  Acute renal failure, on chronic kidney disease.  4.  Hypertension.  5.  Cardiomyopathy, ejection fraction 20%.  6.  Tobacco abuse.   PLAN OF TREATMENT: 1.  The patient will be admitted to the medical floor. We will start on Rocephin and follow up blood culture, urine culture and CBC.   2.  We will give gentle IV fluid for rehydration and follow up BMP.  3.  We will continue the patient's home hypertension medication.  4.  Gastrointestinal and deep vein thrombosis prophylaxis.   I discussed the patient's condition and plan of treatment with the patient's sister. She wants FULL CODE for the patient.   TIME SPENT: About 50 minutes. ____________________________ Demetrios Loll, MD qc:sb D: 09/12/2013 15:14:59 ET T: 09/12/2013 15:50:42  ET JOB#: [id number]  cc: Demetrios Loll, MD, <Dictator> Demetrios Loll MD ELECTRONICALLY SIGNED 09/14/2013 15:02

## 2015-04-05 NOTE — H&P (Signed)
PATIENT NAME:  Kyle Short, Kyle Short MR#:  [id number] DATE OF BIRTH:  Jun 10, 1946  DATE OF ADMISSION:  01/13/2013  PRIMARY CARE PHYSICIAN: None.   REFERRING EMERGENCY ROOM PHYSICIAN: Dr. Cinda Quest.  CHIEF COMPLAINT: Fall, syncopal episode.   HISTORY OF PRESENTING ILLNESS: The patient is a 69 year old male with past medical history of hypertension, cardiomyopathy, alcohol abuse, tobacco abuse, ejection fraction 25%, and noncompliant to the medication, had 2 episodes of fall in the last 2 days. On further questioning, he denies any feeling of chest pain, palpitation, headache, respiratory problem, shortness of breath. He says that he suddenly feels blackout and falls on the floor, and then he feels okay after a few minutes, so he was brought to the Emergency Room today because of this episode. ER physician on further work-up, CAT scan was negative for any finding but he had troponin positive at 0.5, so he is being admitted on the medical service for further workup.   REVIEW OF SYSTEMS:  CONSTITUTIONAL: Negative for fever, fatigue, weakness, pain, weight loss.  EYES: No blurring, double vision, pain or redness.  ENT: No tinnitus, ear pain, hearing loss or epistaxis.  RESPIRATORY: No cough, wheezing, hemoptysis or dyspnea.  CARDIOVASCULAR: No chest pain, orthopnea, arrhythmia or palpitations, but has a history of syncopal episodes.  GASTROINTESTINAL: No nausea, vomiting, diarrhea, abdominal pain or constipation.  GENITOURINARY: No dysuria, hematuria, increased frequency of urination.  ENDOCRINE: No intolerance to heat or cold.  SKIN: No rashes or lesions on the skin.  MUSCULOSKELETAL: No pain in joints or swelling.  NEUROLOGICAL: No numbness, weakness, tremors or headache.  PSYCHIATRIC: No anxiety, insomnia or depression   PAST MEDICAL HISTORY: Hypertension, cardiomyopathy, decreased ejection fraction of 123456, smoker, alcoholic, noncompliance to the medication.   PAST SURGICAL HISTORY: Tonsillectomy.    SOCIAL HISTORY: Retired from [employer]. Lives alone at home. Active cigarette smoker, up to 3 to 5 cigarettes per day, and drinks about 1 to 2 shots of liquor every day.   FAMILY HISTORY: Positive for hypertension and diabetes in first degree relative, coronary artery disease in brother. No history of CVA in the family.   HOME MEDICATIONS: None.  PHYSICAL EXAMINATION:  VITAL SIGNS: Temperature 97, pulse rate 86, respirations 20, blood pressure 166/104, pulse oximetry 96% on room air. GENERAL: He is fully alert and oriented to time, place and person and does not appear in any acute distress at this point of time.  HEAD AND NECK: On left side of lateral face, there is a contused wound present, no active bleeding. Conjunctivae pink. Oral mucosa moist. Hearing grossly intact.  NECK: Supple. No JVD.  RESPIRATORY: No crackles. Bilateral equal air entry.  CARDIOVASCULAR: S1, S2 present, regular. No murmur present.  ABDOMEN: Soft and nontender. Bowel sounds present. No organomegaly appreciated.  SKIN: No rashes.  EXTREMITIES: Legs: No edema. Joints: No swelling or tenderness.  NEUROLOGICAL: Power 5/5 in all 4 limbs. No tremor. No gross abnormalities.  LABORATORY, DIAGNOSTIC AND RADIOLOGICAL DATA: Glucose 112, BUN 57, creatinine 2.49, sodium 138, potassium 4.0, chloride 108, CO2 of 19. Troponin 0.53. TSH 2.5. WBC 8.7, hemoglobin 12.2, platelet count 167, MCV 86. CT of the head without contrast: No acute intracranial process. EKG: Sinus rhythm, left ventricular hypertrophy with reciprocal changes and T wave inversion in lateral chest lead, same as what was present in the past.   ASSESSMENT AND PLAN: This is a 69 year old male with history of hypertension, chronic renal failure, cardiomyopathy, ejection fraction 25%, noncompliant, current smoker, presented with episode of fall,  syncopal episode with elevated troponin.  1.  Non-ST elevation myocardial infarction: Will start him on heparin IV  drip. Follow serial troponin. Will admit to telemetry floor. Will get echocardiogram and cardiology consult. Will also start on aspirin, beta blocker and statin.  2.  Fall, syncopal episode: Possibly arrhythmia due to cardiomyopathy. Will monitor on telemetry floor.  3.  Hypertension: Will continue on beta blocker.  4.  Chronic renal failure: This is a chronic issue and the patient is noncompliant. We advised him to follow in nephrology clinic after discharge.  5.  Smoker: Counseling done for cessation for 5 minutes and we will give him nicotine patch while he is in the hospital.   CODE STATUS: Full code. Healthcare power of attorney, daughter, Ms. Kyle Short.  TOTAL CARE TIME SPENT: 55 minutes.   ____________________________ Ceasar Lund Anselm Jungling, MD [id number] D: 01/13/2013 18:09:20 ET T: 01/13/2013 19:08:34 ET JOB#: [id number]  cc: Ceasar Lund. Anselm Jungling, MD, <Dictator> Vaughan Basta MD ELECTRONICALLY SIGNED 02/03/2013 18:01

## 2015-04-05 NOTE — Consult Note (Signed)
Referring Physician:  Theodoro Grist :   Primary Care Physician:  Theodoro Grist : [employer], [address] [place], [place] [address], [place]  Reason for Consult: Admit Date: 04-Oct-2013  Chief Complaint: presyncope  Reason for Consult: presyncope   History of Present Illness: History of Present Illness:   69 yo RHD M who was residing in a NH presents to [facility] secondary to an episode of not feeling well while eating.  Pt states that he felt as though he was going to pass out and started to feel dizzy with nausea and sweating.  No incontinence, no tongue biting, no post-ictal confusion.  Pt never passed completely out and symptoms resolved after he laid down for a while.  ROS:  General denies complaints   HEENT no complaints   Lungs no complaints   Cardiac no complaints   GI no complaints   GU no complaints   Musculoskeletal no complaints   Extremities no complaints   Skin no complaints   Endocrine no complaints   Psych no complaints   Past Medical/Surgical Hx:  resides in Group Home:   Cardiomyopathy:   HTN:   Tonsillectomy:   Past Medical/ Surgical Hx:  Past Medical History some lung problem, frequent UTI   Past Surgical History as above   Home Medications: Medication Instructions Last Modified Date/Time  atorvastatin 20 mg oral tablet 1 tab(s) orally once a day (at bedtime) 22-Oct-14 09:36  carvedilol 12.5 mg oral tablet 1 tab(s) orally 2 times a day 22-Oct-14 09:36  aspirin 81 mg oral delayed release tablet 1 tab(s) orally once a day 22-Oct-14 09:36  hydrALAZINE 25 mg oral tablet 1 tab(s) orally 4 times a day 22-Oct-14 09:36  furosemide 20 mg oral tablet 1 tab(s) orally once a day 22-Oct-14 09:36  traZODone 50 mg oral tablet 1 tab(s) orally once a day (at bedtime) 22-Oct-14 09:36  isosorbide mononitrate 30 mg oral tablet, extended release 1 tab(s) orally once a day 22-Oct-14 09:36   Allergies:  No Known Allergies:    Social/Family History: Employment Status: unemployed  Lives With: alone  Living Arrangements: residential facility  Social History: remote heavy EtOH, + tob, no illicits  Family History: no seizure   Vital Signs: **Vital Signs.:   [id number] 37:16  Systolic BP Systolic BP 967  Diastolic BP (mmHg) Diastolic BP (mmHg) 80  Pulse Lying Pulse Lying 60  Systolic BP Systolic BP 893  Diastolic BP (mmHg) Diastolic BP (mmHg) 90  Pulse Pulse Sitting 65  Systolic BP Systolic BP 810  Diastolic BP (mmHg) Diastolic BP (mmHg) 80  Pulse Standing Pulse Standing 78   Physical Exam: General: NAD, resting comfortable, appropiate weight  HEENT: normocephalic, sclera nonicteric, oropharynx clear  Neck: supple, no JVD, no bruits  Chest: CTA B, no wheezing, good movement  Cardiac: RRR, no murmurs, no edema, 2+ pulses  Extremities: no C/C/E, FROM   Neurologic Exam: Mental Status: alert and oriented x 3, normal speech and language, follows complex commands  Cranial Nerves: PERRLA, EOMI, nl VF, face symmetric, tongue midline, shoulder shrug equal  Motor Exam: 5/5 B normal, tone, no tremor  Deep Tendon Reflexes: 1+/4 B, down going plantars  Sensory Exam: pinprick, temperature, and vibration intact B  Coordination: FTN and HTS WNL   Lab Results: LabObservation:  22-Oct-14 13:50   OBSERVATION Reason for Test  Hepatic:  23-Oct-14 01:47   Bilirubin, Total 0.2  Alkaline Phosphatase 86  SGPT (ALT) 23  SGOT (AST) 29  Total Protein, Serum  6.9  Albumin, Serum  2.8  Routine Chem:  23-Oct-14 01:47   Result Comment TROPONIN - RESULTS VERIFIED BY REPEAT TESTING.  - PREVIOUS CALL:10/04/13 AT 1021.Marland KitchenTPL  Result(s) reported on 05 Oct 2013 at 02:43AM.  Glucose, Serum  101  BUN  32  Creatinine (comp)  2.90  Sodium, Serum 139  Potassium, Serum 3.5  Chloride, Serum  108  CO2, Serum 24  Calcium (Total), Serum 8.6  Osmolality (calc) 285  eGFR (African American)  25  eGFR (Non-African American)  22  (eGFR values <1mL/min/1.73 m2 may be an indication of chronic kidney disease (CKD). Calculated eGFR is useful in patients with stable renal function. The eGFR calculation will not be reliable in acutely ill patients when serum creatinine is changing rapidly. It is not useful in  patients on dialysis. The eGFR calculation may not be applicable to patients at the low and high extremes of body sizes, pregnant women, and vegetarians.)  Anion Gap 7  Cardiac:  23-Oct-14 01:47   CK, Total 98  CPK-MB, Serum 1.7 (Result(s) reported on 05 Oct 2013 at 02:38AM.)  Troponin I  0.07 (0.00-0.05 0.05 ng/mL or less: NEGATIVE  Repeat testing in 3-6 hrs  if clinically indicated. >0.05 ng/mL: POTENTIAL  MYOCARDIAL INJURY. Repeat  testing in 3-6 hrs if  clinically indicated. NOTE: An increase or decrease  of 30% or more on serial  testing suggests a  clinically important change)  Routine UA:  22-Oct-14 11:47   Color (UA) Yellow  Clarity (UA) Clear  Glucose (UA) Negative  Bilirubin (UA) Negative  Ketones (UA) Negative  Specific Gravity (UA) 1.015  Blood (UA) Negative  pH (UA) 5.0  Protein (UA) Negative  Nitrite (UA) Negative  Leukocyte Esterase (UA) 2+ (Result(s) reported on 04 Oct 2013 at 11:55AM.)  RBC (UA) 1 /HPF  WBC (UA) 18 /HPF  Bacteria (UA) NONE SEEN  Epithelial Cells (UA) <1 /HPF  Mucous (UA) PRESENT  Hyaline Cast (UA) 3 /LPF (Result(s) reported on 04 Oct 2013 at 11:55AM.)  Routine Hem:  22-Oct-14 09:31   Segmented Neutrophils 50  Lymphocytes 41  Monocytes 9  Diff Comment 1 ANISOCYTOSIS  Diff Comment 2 PLTS VARIED IN SIZE  Diff Comment 3 ELLIPTOCYTES  Diff Comment 4 TARGET CELLS  Result(s) reported on 04 Oct 2013 at 01:13PM.  23-Oct-14 01:47   WBC (CBC) 6.5  RBC (CBC)  3.72  Hemoglobin (CBC)  10.9  Hematocrit (CBC)  31.2  Platelet Count (CBC) 177  MCV 84  MCH 29.4  MCHC 35.0  RDW 14.1  Neutrophil % 33.5  Lymphocyte % 51.6  Monocyte % 12.4  Eosinophil % 2.1   Basophil % 0.4  Neutrophil # 2.2  Lymphocyte # 3.4  Monocyte # 0.8  Eosinophil # 0.1  Basophil # 0.0 (Result(s) reported on 05 Oct 2013 at 02:10AM.)   Radiology Results: [place]:    22-Oct-14 13:01, US Carotid Doppler Bilateral  US Carotid Doppler Bilateral   REASON FOR EXAM:    presyncope  COMMENTS:       PROCEDURE: [place]  - US CAROTID DOPPLER BILATERAL  - Oct 04 2013  1:01PM     RESULT: Grayscale and color flow Doppler techniques were employed to   evaluate the cervical carotid arteries.    Both right and left carotid systems reveal small amounts of plaque at the   carotid bulbs. The waveform patterns and color flow images appear normal.   On the right the peak internal carotid systolic velocity measured 96  cm/sec and the peak common carotid velocitymeasured 84 cm/sec   corresponding to ratio of 1.1. On the left the peak internal carotid   systolic velocity measured 81 cm per second and the peak common carotid   velocity measured 101 cm/sec corresponding to a normal ratio of 0.8. The     vertebral arteries are normal in flow direction bilaterally.    Incidentally noted was echogenic material in the right internal jugular   vein which exhibits sluggish flow but normal compressibility.    IMPRESSION:   1. There is no evidence of a hemodynamically significant carotid stenosis.  2. There is sluggish flow in the right internal jugular vein. The vessel   is normally compressible. The findings may reflect increased venous   pressures at the level of the right atrium. No occlusive thrombus is   demonstrated in the internal jugular vein.     Dictation Site: 2      Verified By: DAVID A. Martinique, M.D., MD  CT:    22-Oct-14 09:43, CT Head Without Contrast  CT Head Without Contrast   REASON FOR EXAM:    weakness  COMMENTS:       PROCEDURE: CT  - CT HEAD WITHOUT CONTRAST  - Oct 04 2013  9:43AM     RESULT: Noncontrast CT of the brain is compared to the study of   09/12/2013. There is  prominence of the ventricles and sulci consistent  with atrophy. There is low-attenuation in the periventricular and   subcortical white matter consistent with chronic microvascular ischemic   disease. There is low-attenuation in the basal ganglia bilaterally   consistent with old infarcts. There is no intracranial hemorrhage, mass,   mass effect or evolving infarct evident. The appearance is stable. The   sinuses and mastoid air cells show normal aeration of the sinuses with   some sclerosis in the mastoid regions which is unchanged. No depressed   skull fracture or calvarial lesion is evident.  IMPRESSION:   1. Atrophy with chronic microvascular ischemic disease. No acute   intracranial abnormality or significant interval change.    Dictation Site: 1        Verified By: Sundra Aland, M.D., MD   Radiology Impression: Radiology Impression: CT of head personally reviewed by me and shows diffuse white matter changes and some atrophy   Impression/Recommendations: Recommendations:   prior notes reviewed by me reviewed by me   Presyncope-  this does not appear to be neurologic in origin and could be related to orthostatis as pt had positive orthostatic BP's and reports decreased PO agree with IV fluids treat UTI nothing else to add from neurologic standpoint so will sign off  Electronic Signatures: Jamison Neighbor (MD)  (Signed 23-Oct-14 13:39)  Authored: REFERRING PHYSICIAN, Primary Care Physician, Consult, History of Present Illness, Review of Systems, PAST MEDICAL/SURGICAL HISTORY, HOME MEDICATIONS, ALLERGIES, Social/Family History, NURSING VITAL SIGNS, Physical Exam-, LAB RESULTS, RADIOLOGY RESULTS, Recommendations   Last Updated: 23-Oct-14 13:39 by Jamison Neighbor (MD)

## 2015-04-05 NOTE — Discharge Summary (Signed)
PATIENT NAME:  Kyle Short, Kyle Short MR#:  [id number] DATE OF BIRTH:  [birth year]  DATE OF ADMISSION:  09/12/2013 DATE OF DISCHARGE:  09/14/2013  REASON FOR ADMISSION: Altered mental status, confusion.   DISCHARGE DIAGNOSES: 1.  Metabolic encephalopathy.  2.  Urinary tract infection.  3.  Atelectasis in both lungs secondary to not moving. Recommended incentive spirometry.  4.  Acute renal failure on top of chronic kidney disease, now back to baseline.  5.  Uncontrolled hypertension.  6.  Cardiomyopathy with ejection fraction 20%.  7.  Tobacco abuse.   IMPORTANT RESULTS: Urinalysis shows Proteus mirabilis which is sensitive to multiple antibiotics including ampicillin, cefazolin, ceftriaxone, levofloxacin and ciprofloxacin is intermittent and is resistant to nitrofurantoin. His urinalysis showed 656 white blood cells. His EKG was sinus bradycardia. His white blood count was 4.6 and hemoglobin was 12.4. Urinalysis UDS was negative. TSH 1.3. Troponin was negative. LFTs were negative. Admission creatinine is 2.67, baseline is around 2.0 to 2.4. At discharge creatinine is 2.3. BUN 26. Magnesium 1.6 at discharge. Magnesium was replaced.    DISPOSITION: To skilled nursing facility.   DISCHARGE MEDICATIONS: 1.  Isosorbide 30 mg once a day. 2.  Atorvastatin 20 mg at bedtime. 3.  Coreg 12.5 mg twice daily. 4.  Aspirin 81 mg once daily.  5.  Hydralazine 25 mg 4 times daily. 6.  Furosemide 20 mg once a day. 7.  Trazodone 50 mg at bedtime.  8.  Acetaminophen 325 mg as needed for pain or fever. 9.  Lisinopril 10 mg once a day. The patient was previously discharged here on lisinopril 5 mg. That medication was not continued during skilled nursing facility. It was stopped at some point. The patient apparently was having orthostatic hypotension at some point. His blood pressure is really high right now and he has an ejection fraction of 30% for what we recommend to continue this medication as long as it is safe.   10.  Amoxicillin with clavulanic 1000 mg/62.5 take 2 tablets twice daily for 10 days to complete treatment for urinary tract infection, complicated.   HOSPITAL COURSE: [name] is a very nice 69 year old gentleman who has history of cardiomyopathy, noncompliance with medications, smoker and hypertension. Was at his nursing home and was found to be confused with altered mental status. The patient was brought into the Emergency Department where he was evaluated. He had a urinary tract infection based on findings on his UA.  Urinalysis was cultured and ended up growing Proteus mirabilis.   The patient was treated with Rocephin in house and that medication was changed to Augmentin XR. Sensitivities are described above. The patient also has bilateral atelectasis in both lungs concerning for possible future development of pneumonia for what we are going to continue his course of antibiotics for a total of 10 days without admitting the patient. The patient does not have clinical signs of pneumonia, but he could develop it based on his low functionality and because he is staying in bed and not coughing up his secretions. The patient will benefit from  starting some incentive spirometry and mobilizing better.   Other problems. His blood pressure has been very elevated for what we are going to start him on lisinopril, which as mentioned above he was taking before. He needs it based on his cardiac function with ejection fraction of 20%.   He did not have any signs of CHF exacerbation during this admission. He is discharged in good condition, afebrile.   TIME SPENT:  About 45 minutes with this patient today. ____________________________ Hollister Sink, MD rsg:sb D: 09/14/2013 14:21:24 ET T: 09/14/2013 15:14:31 ET JOB#: [id number]  cc: Carpenter Sink, MD, <Dictator> Keitra Carusone America Brown MD ELECTRONICALLY SIGNED 09/19/2013 23:33

## 2015-04-05 NOTE — Discharge Summary (Signed)
PATIENT NAME:  Kyle Short, Kyle Short MR#:  [id number] DATE OF BIRTH:  04-05-1946  DATE OF ADMISSION:  10/04/2013 DATE OF DISCHARGE:  10/05/2013  DISCHARGE DIAGNOSES:  1.  Presyncope, likely due to orthostasis, due to dehydration.  2.  Possible uncomplicated urinary tract infection, improving on antibiotics.   SECONDARY DIAGNOSES:  1.  Cardiomyopathy with ejection fraction of 20% to 30%.  2.  Metabolic encephalopathy.  3.  History of uncontrolled hypertension.   CONSULTATIONS: Neurology, Dr. Valora Corporal.   PROCEDURES AND RADIOLOGY: Chest x-ray on the 22nd of October showed no acute cardiopulmonary disease.   CT scan of the head without contrast on the 22nd of October showed atrophy with chronic microvascular ischemic disease. No acute intracranial abnormality.   Bilateral carotid Dopplers on the 22nd of October showed no evidence of hemodynamically significant stenosis. Sluggish flow in the right internal jugular vein. Possible increased venous pressure at the level of the right atrium. No occlusive thrombus in the internal jugular vein.   A 2-D echocardiogram on the 22nd of October showed normal LV systolic function, with ejection fraction of 55% to 60%. Normal global LV systolic function. Mild concentric left ventricular hypertrophy. Mild mitral valve regurgitation. Mildly increased LV septal thickness. Thickening of anterior and posterior mitral valve leaflets. Mildly increased LV posterior wall thickness.   Urinalysis on admission was negative.   HISTORY AND SHORT HOSPITAL COURSE: The patient is a 69 year old male with the above-mentioned medical problems who was admitted for presyncope and some altered mental status. Please see Dr. Keenan Bachelor dictated history and physical for further details. The patient's presyncope was thought to be due to orthostasis. Neurological consultation was obtained with Dr. Valora Corporal, who recommended IV hydration and treatment of urinary tract infection,  which was performed. The patient was close to his baseline and was discharged to assisted living facility in stable condition on the 23rd of October. On the date of discharge, his vital signs are as follows: Temperature 97.9, heart rate 64 per minute, respirations 18 per minute, blood pressure 140/86 mmHg. He was saturating 98% on room air.   PERTINENT PHYSICAL EXAMINATION:    CARDIOVASCULAR: On the date of discharge, S1, S2 normal. No murmurs, rubs, or gallop.  LUNGS: Clear to auscultation bilaterally. No wheezing, rales, rhonchi or crepitation.  ABDOMEN: Soft, benign.  NEUROLOGIC: Nonfocal examination.  All other physical examination remained at baseline.   DISCHARGE MEDICATIONS:  1.  Isosorbide mononitrate 30 mg p.o. daily.  2.  Atorvastatin 20 mg p.o. at bedtime.  3.  Aspirin 81 mg p.o. daily.  4.  Lasix 20 mg p.o. daily.  5.  Trazodone 50 mg p.o. at bedtime.  6.  Levaquin 250 mg p.o. daily for three more days.  7.  Hydralazine 50 mg p.o. 4 times a day.   DISCHARGE DIET: Regular.   DISCHARGE ACTIVITY: As tolerated.   DISCHARGE INSTRUCTIONS AND FOLLOW-UP: The patient was instructed to follow up with his primary care physician while at the assisted living facility. He was instructed follow-up with cardiology, Dr. Bartholome Bill, in 2 to 4 weeks, and [facility] in 4 to 6 weeks.   TOTAL TIME DISCHARGING THIS PATIENT: 45 minutes.   ____________________________ Lucina Mellow. Manuella Ghazi, MD vss:cg D: 10/08/2013 23:05:13 ET T: 10/09/2013 03:43:53 ET JOB#: [id number]  cc: Helaman Mecca S. Manuella Ghazi, MD, <Dictator> primary care physician  Javier Docker. Ubaldo Glassing, MD Mila Homer Tamala Julian, [place] Lodge MD ELECTRONICALLY SIGNED 10/10/2013 15:50

## 2015-04-05 NOTE — Consult Note (Signed)
General Aspect 69 yo african [place] male with history of cardiomyopathy with ef of 20% by echo less than one year ago, history of tobacco and etoh abuse which conitnues who was admitted after suffering several falls that he states was mechanical and not due to syncope. He is significantly orthostatic at present. He denies dizziness at home. He states he drinks 2 shots of etoh daily adn smokes a pack a day. He denies chest pain. He has a mild tropinin elevation to 0.5. He also has chronic kidney disease with GFR of 27. He is currently resting comfortably.   Physical Exam:   GEN thin    HEENT PERRL, hearing intact to voice    NECK supple    RESP no use of accessory muscles  rhonchi    CARD Regular rate and rhythm  No murmur    ABD denies tenderness  no hernia  normal BS    LYMPH negative neck, negative axillae    EXTR negative cyanosis/clubbing, negative edema    SKIN normal to palpation    NEURO cranial nerves intact, motor/sensory function intact    PSYCH alert, poor insight   Review of Systems:   Subjective/Chief Complaint joint pain after falling    General: Weakness    Skin: No Complaints    ENT: No Complaints    Eyes: No Complaints    Neck: No Complaints    Cardiovascular: No Complaints    Gastrointestinal: No Complaints    Genitourinary: No Complaints    Vascular: No Complaints    Musculoskeletal: Muscle or joint pain    Neurologic: No Complaints    Hematologic: No Complaints    Endocrine: No Complaints    Psychiatric: No Complaints    Review of Systems: All other systems were reviewed and found to be negative    Medications/Allergies Reviewed Medications/Allergies reviewed     HTN:    Tonsillectomy:   EKG:   EKG NSR    Abnormal NSSTTW changes    No Known Allergies:     Impression 69 yo male with history of cardiomyopathy of unclear etiiology who was admitted after suffering several falls. Pt states he did not lose conciousness  with the falls and denies dizzyness. He has had normal sinus rhythm since admission with no significant tachy or brady arrythmias. He has a mild troponiin elevation with elevated cpk. This may be secondary to his renal dysfunciton and muscle trauma due to his fall, i.e. rhabdomyolysis vs ischemia vs demand secondary to his cardiomyopathy. He does not appear to be in chf at present. He was orthostatic on presentation which may also be the etilogy of his falls. Alternatively, given his cardiomyopathy, ventricular arrythmia may be playing a role. Pt has been non compliant with his meds and has no physician as outpatient. Will need evidence based beta blockers and consideration of afterload reduction with ace i if tolerated hemodynaically and from a nephrology standpoint. Cessation of tobacco and etoh was discussed as well as compliance with his meds.    Plan 1. Add carvedilol at 6.25 mg bid 2. Discontinue metoprolol and hydralazine and nitrates and heparin. 3. Follow heart rate and blood pressure and if stable would add low dose ace i and follow renal function 4. Discontinue tobacco and etoh 5. Consider gentle hydration given orthostatic vital signs.  6. Would consider holter monitor at discharge to follow for evidence of ventricular arrythmia 7. If patient is compliant with evidence based meds, future consideration of primary arrythmia prevention  with aicd by madit 2 criteria could be considered.   Electronic Signatures: Teodoro Spray (MD)  (Signed 01-Feb-14 09:26)  Authored: General Aspect/Present Illness, History and Physical Exam, Review of System, Past Medical History, EKG , Allergies, Impression/Plan   Last Updated: 01-Feb-14 09:26 by Teodoro Spray (MD)

## 2015-04-05 NOTE — Discharge Summary (Signed)
PATIENT NAME:  Kyle Short, Kyle Short MR#:  [id number] DATE OF BIRTH:  [birth year]  DATE OF ADMISSION:  01/13/2013 DATE OF DISCHARGE:    For detailed note, please see the history and physical on admission with Dr. Anselm Jungling.  DIAGNOSES AT DISCHARGE: 1.  Syncope likely related to orthostatic hypotension, now resolved.  2.  Malignant hypertension.  3.  Elevated troponin likely in the setting of a fall and malignant hypertension. 4.  Chronic kidney disease, stage III.  5.  Severe cardiomyopathy, ejection fraction of 20%.   DIET:  The patient is being discharged on a low sodium, low fat diet.   ACTIVITY: As tolerated.   FOLLOWUP:  Follow-up Dr. Jordan Hawks from cardiology in the next 1 to 2 weeks. Also follow up with Dr. Juleen China from nephrology as a new appointment in the next 7 to 14 days.    DISCHARGE MEDICATIONS:  Imdur 30 mg daily. Lisinopril 5 mg daily.  Atorvastatin 20 mg daily. Coreg 12.5 mg b.i.d.  Aspirin 81 mg daily.  Hydralazine 20 mg q.i.d.  Lasix 20 mg daily.   [place] COURSE:   Bartholome Bill, MD from cardiology.  PERTINENT STUDIES DONE DURING THE HOSPITAL COURSE:  CT scan of the head done without contrast on admission showing no acute intracranial process as 3ell as a chest x-ray done on admission showing no acute cardiopulmonary disease of the chest.  An 2-dimensional echocardiogram showing moderate concentric left ventricular hypertrophy, moderately to severely reduced LV function with ejection fraction of 20% to 25%. Mild tricuspid regurgitation, mild mitral regurgitation, mild aortic regurgitation.   BRIEF HOSPITAL COURSE:    This is a 69 year old male who presented to the hospital after a fall/syncopal episode and noted to have an elevated troponin.    PROBLEM #1:  Fall/syncope. The patient was suspected to have syncope, although did not completely lose consciousness. He denied any prodromal symptoms prior to his syncopal episode. He had an extensive work-up  including CT head which was negative. He was noted to be orthostatic which was likely the cause of the patient's syncope.  After being hydrated with some IV fluids, he has had no further episodes of syncope. He has had no alarms on telemetry with no evidence of any acute arrhythmia. The patient has ambulated without any further episodes and therefore is being discharged to a skilled nursing facility presently. The patient was evaluated by physical therapy who thought he would benefit from short-term rehab. Therefore, he is being discharged there presently.   PROBLEM #2:  Chronic kidney disease. The patient has a history of chronic kidney disease, does not follow up with a nephrologist. I have referred him for nephrology as an outpatient.   PROBLEM #3:  Elevated troponin. The patient presented to the hospital after a fall and noted to have a troponin of 0.53.  It taper down to as low as 0.3. He never really had any chest pain and has no arrhythmias.  The likely cause of the elevated troponin was probably related to malignant hypertension, poor renal clearance and likely the fall. The patient was seen by cardiology who did not think that the patient needed any further investigation with cardiac catheterization or stress test at this time. We will continue medical management, aspirin, statin and a beta blocker, as stated.   PROBLEM #4:  History of severe cardiomyopathy, ejection fraction of 20% to 25%. This is apparently chronic in nature. The patient is noncompliant with his meds.  He was not taking any  medications prior to coming in. At present, the patient is being discharged on Coreg, low dose ACE inhibitor, Lasix. Imdur and hydralazine as stated.   PROBLEM #5:  Malignant hypertension. The patient had significantly elevated blood pressures when he presented to the hospital with systolic blood pressures as high as over 190s to 200s. This has significantly improved after the patient has been started on  appropriate regimen. The patient currently is hemodynamically stable. He is being discharged on Coreg, Imdur, hydralazine and Lasix as mentioned.   As mentioned, the patient was by physical therapy and thought to benefit from short-term rehab. Therefore, he is presently being discharged there.   CODE STATUS: The patient is a full code.   Time spent with discharge: 40 minutes.     ____________________________ Belia Heman. Verdell Carmine, MD vjs:ct D: 01/17/2013 09:56:56 ET T: 01/17/2013 10:36:56 ET JOB#: [id number]  cc: Belia Heman. Verdell Carmine, MD, <Dictator> Javier Docker. Ubaldo Glassing, MD Mamie Levers, MD Henreitta Leber MD ELECTRONICALLY SIGNED 01/18/2013 8:32

## 2015-04-05 NOTE — H&P (Signed)
PATIENT NAME:  Kyle Short, Kyle Short MR#:  [id number] DATE OF BIRTH:  03-04-1946  DATE OF ADMISSION:  10/04/2013  PRIMARY CARE PHYSICIAN: None   NEPHROLOGIST:  Dr. Juleen China.    CARDIOLOGIST: Dr. Ubaldo Glassing   HISTORY OF PRESENT ILLNESS:   The patient is a 69 year old African American male with past medical history significant for a history of cardiomyopathy with ejection fraction of 20% to 30%, history of hypertension, ongoing tobacco abuse, chronic renal failure, who was in the hospital for urinary tract infection from the 09/12/2013 through 09/14/2013. He was discharged to a rehabilitation facility and he presents back from the facility with altered mental status. Apparently he was noted by staff in the facility to be less active and less talkative as usual. He was somewhat sluggish and smelled of urine.  The patient himself tells me that whenever he tried to eat his breakfast, he felt somewhat presyncopal, felt lightheaded and dizzy. However, denies any chest pains. No numbness or weakness in his upper or lower extremities but because he did not feel well, he was brought to the Emergency Room for further evaluation. In the Emergency Room he was noted to be somewhat dehydrated with elevated creatinine level of 3.47 from baseline of 2.3, and hospitalist services were contacted for admission. The patient denies any pain.   PAST MEDICAL HISTORY:  Significant for recent history of urinary tract infection, Proteus mirabilis diagnosed on 09/12/2013, discharged on Augmentin to rehabilitation facility.  Also history of cardiomyopathy, ejection fraction of 20% to 30%. History of metabolic encephalopathy due to urinary tract infection in the past, history of lung atelectasis, which was felt to be due to sedentary lifestyle, history of  uncontrolled hypertension and tobacco abuse, ongoing.   MEDICATIONS: Aspirin 81 mg p.o. daily, atorvastatin 10 mg daily, carvedilol 12.5 mg p.o.  twice daily, furosemide 10 mg p.o. daily,  hydralazine 25 mg daily, isosorbide mononitrate 30 mg daily and trazodone 50 mg p.o. daily.   PAST SURGICAL HISTORY: Tonsillectomy.   SOCIAL HISTORY: He resides in a skill nursing facility now.  He smokes. He also has history of alcohol abuse, but not recently.   FAMILY HISTORY: According to prior history, the patient's family history is positive for hypertension, diabetes, as well as coronary disease.   ALLERGIES: NO KNOWN DRUG ALLERGIES.   REVIEW OF SYSTEMS:  GENERAL:  Shortness of breath with episode of dizziness, lightheadedness, feeling presyncopal as well as some blurring of vision. Admits to having some problems with urination which seem to be very chronic and inconstant. He admits to having difficulty to start urine as well not emptying fully his bladder.  He tells me that he is able to walk by himself. No cane or walker  required.  CONSTITUTIONAL:  He denies any fevers or chills, fatigue, weakness except as mentioned above, today. He denies any pains, weight loss or gain.  EYES:  (Dictation Anomaly) Denies blurry vision, double vision, glaucoma or cataracts.   EARS, NOSE, THROAT: He denies any tinnitus, allergies, epistaxis, sinus tenderness or difficulty swallowing.  RESPIRATORY:  He denies cough, asthma, COPD.  CARDIOVASCULAR: Denies any chest pains, orthopnea, palpations.  GASTROINTESTINAL: Denies any nausea, vomiting, diarrhea or constipation. GENITOURINARY: Denies dysuria, hematuria, frequency, incontinence.  ENDOCRINE: Denies polydipsia, nocturia, thyroid problems, heat or cold intolerance or thirst.   HEMATOLOGIC: Denies anemia, bruising, bleeding or swollen glands.  SKIN: Denies any acne, rash, lesion or change in moles.  MUSCULOSKELETAL: Denies arthritis, cramps, swelling, gout.  NEUROLOGIC: No numbness, epilepsy or tremor.  PSYCHIATRIC: Denies anxiety, insomnia or depression.   PHYSICAL EXAMINATION: VITAL SIGNS: On arrival to the hospital, temperature is 97.6. Pulse is  50, respirations 18, blood pressure 147/71 and saturation was 100% on room air.  GENERAL: This is a well-developed, well-nourished African American male in no significant distress, sitting on the stretcher.  HEENT: Pupils are equal, reactive to light. Extraocular movements are intact. No icterus or conjunctivitis. Has normal hearing. The patient has a soft voice. No pharyngeal erythema. Mucosa is moist.  NECK: No masses, supple, nontender.  Thyroid is not enlarged.  No adenopathy. No JVD or carotid bruits bilaterally. Full range of motion.  LUNGS: Clear to auscultation in all fields.  No labored inspirations, decreased effort, dullness to percussion or overt respiratory distress. A few rhonchi were heard at the bases.  CARDIOVASCULAR: S1 and S2 appreciated. No murmurs, gallops or rubs.  Rhythm was regular and bradycardic. PMI not lateralized.  Chest is nontender to palpation.  EXTREMITIES: 1+ pedal pulses. Diminished pedal pulses.  No lower extremity edema, calf tenderness or cyanosis was noted.  ABDOMEN: Soft, nontender. Bowel sounds are present.  No hepatosplenomegaly or masses were noted.  RECTAL:  Deferred.   MUSCLE STRENGTH: Able to move all extremities. No cyanosis, degenerative joint disease or kyphosis. Gait is not tested.  SKIN: No skin rashes, lesions, erythema, nodularity or induration. It was warm and dry to palpation.  LYMPHATIC: No adenopathy in the cervical region.  NEUROLOGIC: Cranial nerves grossly intact. Sensory is intact. No dysarthria or aphasia.  PSYCHIATRIC: The patient is alert, oriented to person and place, cooperative. Memory is good.  No significant confusion, agitation or depression noted.   LABORATORY DATA: BMP showed BUN and creatinine of 37 and 3.47, as opposed to 26 and 2.30 on 09/19/2013.  Glucose 107, otherwise BMP was unremarkable. The patient's estimated GFR for African American would be 20.  Liver enzymes were remarkable for albumin level of 3.2 otherwise  unremarkable. The patient's cardiac enzymes: Troponin was elevated at 0.06. MB fraction normal at 2.2 and CK 105. The patient's white blood cell count is not checked yet.  His hemoglobin level was only done at 12.5. Urinalysis also is not yet back.  EKG:  Sinus brady at 51 (Dictation Anomaly) , normal axis, septal infarct age undetermined. There is QS in V1, V2.  (Dictation Anomaly) T  depressions in lateral leads, as well as inferior leads. (Dictation Anomaly) NO change from prior EKG.  CT  head without contrast showed (Dictation Anomaly)atrophy with chronic microvascular ischemic disease. No acute intracranial abnormality or significant interval change were noted.   ASSESSMENT AND PLAN: 1.  Altered mental status, questionable metabolic encephalopathy due to recurrent urinary tract infection. We will get urinalysis and blood cultures.  We will start him for now on meropenem until we have received urinalysis. Will follow clinically.  It could be just that the patient is somewhat dehydrated.  2.  Presyncope, questionable due to bradycardia. We will need to get a lower Coreg dose. We will follow the patient's heart rate we will check the patient's ultrasound of carotids as well as echo if it was not done recently. We will also get orthostatic vital signs.  3.  Elevated troponin of unclear significance of this time. EKG showed inferolateral ischemia but that is not new. It was obvious also in September 2014 EKG.  We will check cardiac enzymes x 3. We will continue the patient on Coreg as well as aspirin therapy.  4.  Tobacco abuse. Nicotine  replacement therapy will be initiated. Discussed cessation for approximately 4 minutes.  5.  Acute of chronic renal failure: We will be holding Lasix as well as ACE inhibitor and we will continue the patient on very low dose of IV fluids just at 20 mL an hour. We will follow the patient's kidney function as well as we will get urinalysis.  6.  Accelerated cardiomyopathy. We  will continue the patient on Imdur as well as hydralazine for now.  7.  History of hypertension. We will be holding lisinopril as mentioned above due to concerns of acute on chronic renal failure.   TIME SPENT: 50 minutes on this patient.    ____________________________ Theodoro Grist, MD rv:dp D: 10/04/2013 12:26:00 ET T: 10/04/2013 13:08:29 ET JOB#: [id number]  cc: Theodoro Grist, MD, <Dictator> Ayaan Ringle MD ELECTRONICALLY SIGNED 11/01/2013 19:48

## 2015-04-06 NOTE — H&P (Signed)
PATIENT NAME:  Kyle Short, Kyle Short MR#:  [id number] DATE OF BIRTH:  [birth year]  DATE OF ADMISSION:  12/12/2014  PRIMARY CARE PHYSICIAN: Nonlocal.   REFERRING EMERGENCY ROOM PHYSICIAN: Verdia Kuba. Paduchowski, MD   CHIEF COMPLAINT: Fever.   HISTORY OF PRESENT ILLNESS: This 69 year old man with history of hypertension, chronic systolic heart failure with an ejection fraction of 20%, chronic alcohol abuse, presents today from group home with altered mental status and fever. Per the report from the group home, he had a temperature of 103 this morning. On arrival to the Emergency Room, his temperature was 101. The patient does not recall any specific symptoms. He denies cough, congestion, nausea, vomiting, diarrhea, dysuria or frequency. His only complaint is of right arm/shoulder pain, which has been ongoing for 1 week. He does not recall any trauma to the shoulder. He has not had any swelling. Pain is constant, worse with any motion of the shoulder joint. On presentation to the Emergency Room with temperature of 101.3, tachycardic at 104, leukocytosis of 14.8, and possible altered mental status the patient meets sepsis criteria. Hospitalist service is asked to admit for further evaluation and treatment.   PAST MEDICAL HISTORY: 1.  Chronic systolic heart failure with an ejection fraction of 20%.  2.  Hypertension.  3.  Chronic alcohol abuse.  4.  Ongoing tobacco abuse.  5.  Hyperlipidemia.  6.  Vitamin D deficiency.   SOCIAL HISTORY: The patient currently resides in a group home. Multiple attempts have been made today to contact the group home for further history and also for medication list, which have been unsuccessful. The patient smokes a half pack of cigarettes per day. The patient drinks about 1 pint of alcohol per day. He is a retired Lawyer. He is a full code.   FAMILY MEDICAL HISTORY: Negative for coronary artery disease or stroke. Negative for colon cancer or breast cancer. Positive  for hypertension and diabetes.   PAST SURGICAL HISTORY: Tonsillectomy   ALLERGIES: No known drug allergies.   HOME MEDICATIONS: Note, this list is not updated. Multiple attempts have been made to contact his living facility without success. This list reflects his discharge medications from October 2014.   1.  Vitamin D2 at 50,000 units 1 capsule once a week.  2.  Trazodone 50 mg 1 tablet once a day at bedtime.  3.  Lisinopril 20 mg 1 tablet once a day.  4.  Isosorbide mononitrate 30 mg extended release 1 tablet daily.  5.  Carvedilol 6.25 mg 1 tablet twice a day.  6.  Atorvastatin 10 mg 1 tablet once a day.  7.  Aspirin 81 mg daily.   REVIEW OF SYSTEMS: Unable to obtain the review of systems due to altered mental status. The patient has replied in the negative for all questions with the exception of right shoulder pain.   PHYSICAL EXAMINATION: VITAL SIGNS: Temperature 98.3, pulse 82, respirations 21, blood pressure 182/107, oxygenation 100% on room air.  GENERAL: No acute distress.  HEENT: Pupils equal, round, and reactive to light. Conjunctivae are clear, extraocular motion intact. Oral mucous membranes are dry, poor dentition with a bridge in place. Posterior oropharynx is clear with no exudate, erythema, or edema.  NECK: Supple. Trachea midline. No cervical lymphadenopathy. Thyroid nontender.  RESPIRATORY: Lungs clear to auscultation bilaterally with good air movement.  CARDIOVASCULAR: Regular rate and rhythm. No murmurs, rubs, or gallops. No peripheral edema. Peripheral pulses are 2+.  MUSCULOSKELETAL: No joint effusions. Range of motion normal  in all joints except for the right shoulder which he does not wish to move. There is a small lipoma overlying the right shoulder joint. No effusion. No erythema. No deformity.  NEUROLOGIC: Cranial nerves II through XII grossly intact. Strength and sensation are intact. Tone is normal.  PSYCHIATRIC: The patient is alert. He is somewhat oriented to  date and person, not to situation. He seems perplexed to be here. No overt uncontrolled anxiety or depression.  SKIN: there is a linear area of erythema and induration over the right lower leg from the knee to the ankle. No skin break or drainage.  LABORATORY DATA: Sodium 134, potassium 4.9, chloride 103, bicarbonate 22, BUN 27, creatinine 2.26, glucose 108, magnesium 1.4, total protein 8.3, albumin 3.1. Other LFTs are normal. Troponin 0.05. White blood cells 14.8, hemoglobin 10.7, platelets 269, MCV 87. UA is negative for signs of infection. Lactate is 1.3.   IMAGING: CT of the head without contrast shows atrophy and small vessel disease. 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 No evidence for acute intracranial abnormality.   Chest x-ray: No edema or consolidation. Stable cardiac enlargement.   One view right shoulder: No acute bony abnormality. There are degenerative changes in the right [facility] joint and glenohumeral joint. No fracture, subluxation or dislocation. No soft tissue abnormality.   ASSESSMENT AND PLAN: 1.  Sepsis: The patient meets septic criteria with fever 101.3, tachycardia 104, leukocytosis of 14.8, and possible altered mental status. Blood cultures are pending. Urinalysis is negative for infection. Chest x-ray negative for infection. CT of the head does not show any acute changes. Vancomycin and Zosyn have been started in the Emergency Room and these will be continued until blood cultures return. It is possible that he has a viral infection which is driving his symptoms.  The linear erythema on his right leg could represent cellulitis. 2.  Acute on chronic kidney disease: Last creatinine 2 months ago was 2.06, currently 2.26. Will provide gentle hydration and monitor carefully. Hold nephrotoxic agents at this time. Recheck in the morning.  3.  Hypomagnesemia: Replete and recheck in the morning.  4.  Anemia: We will get iron studies.  5.  Leukocytosis: This is  likely due to #1.  6.  Chronic alcohol abuse: Will initiate CIWA protocol.  7.  Ongoing tobacco abuse: Smoking cessation counseling provided for 3 to 5 minutes during the admission process. Nicotine patch.  8.  Congestive heart failure, chronic systolic failure with an ejection fraction of 20%: Will  maintain a low sodium diet, monitor ins and outs, check daily weights. The patient does not currently show signs of heart failure exacerbation, but has received significant volume repletion in the Emergency Room. We will need to monitor carefully.  9.  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 We will provide Tylenol for pain control.  10.  Prophylaxis: Heparin for deep vein thrombosis prophylaxis. No gastrointestinal prophylaxis as the patient is fairly stable.  11. right lower leg erythema: will get a doppler ultrasound to rule out DVT. Possible cellulitis.  TIME SPENT ON ADMISSION: 55 minutes.    ____________________________ Earleen Newport. Volanda Napoleon, MD cpw:at D: 12/12/2014 [id number] ET T: 12/12/2014 16:23:00 ET JOB#: [id number]  cc: Barnetta Chapel P. Volanda Napoleon, MD, <Dictator> Aldean Jewett MD ELECTRONICALLY SIGNED 12/12/2014 18:08

## 2015-04-07 NOTE — Discharge Summary (Signed)
PATIENT NAME:  Kyle Short, Kyle Short MR#:  [id number] DATE OF BIRTH:  1946-10-21  DATE OF ADMISSION:  05/27/2012 DATE OF DISCHARGE:  05/30/2012  ADMISSION DIAGNOSIS: Malignant hypertension.   DISCHARGE DIAGNOSES:  1. Malignant hypertension.  2. Severe cardiomyopathy with ejection fraction of 20%.  3. Chronic kidney disease, stage III/IV. 4. Nausea and vomiting.  5. Headache and transient right weakness.   CONSULTANTS: Serafina Royals, MD  LABORATORY, DIAGNOSTIC AND RADIOLOGIC DATA: Sodium 137, potassium 3.7, chloride 103, bicarbonate 26, BUN 22, creatinine 2.32, and glucose 99.  MRI of the brain showed no evidence of acute ischemia.   Renal ultrasound showed no evidence of hydronephrosis.   Echocardiogram showed global hypokinesis of the left ventricle with ejection fraction 20% with moderate concentric left ventricular hypertrophy, left atrium moderately dilated, moderate MR, and  mild to moderate TR.   Troponins x3 were negative. TSH 1.12.   HOSPITAL COURSE: The patient is a 69 year old male who presented with malignant hypertension. For further details, please refer to Dr. Gita Kudo history and physical.  1. Malignant hypertension. The patient was initially placed on nitro drip. He was changed to p.o. medications include hydralazine, Imdur, and Norvasc. No beta blocker was given for his bradycardia and no ACE inhibitor or ARB due to renal failure. This can be added as an outpatient with close monitoring of his creatinine. His blood pressure has been stable off the nitro drip and on his p.o. medications.  2. Congestive heart failure with systolic dysfunction, ejection fraction of 20%. I am not sure if this is ischemic or nonischemic. The patient will need cardiac catheterization at some point and possible automatic implantable cardiac defibrillator, but not right now due to the renal failure. He would benefit from Aldactone possibly if creatinine remains stable and ejection fraction  remains low. Appreciate Dr. Alveria Apley consult. His congestive heart failure was chronic and the patient was euvolemic. 3. 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 Avoid nephrotoxins. The patient will need follow-up with his primary care physician and possible referral to nephrology as an outpatient.  4. Nausea and vomiting secondary to hypertension and urgency which improved.  5. Headache and transient right weakness due to hypertensive encephalopathy. CT of the head and MRI was negative for acute cerebrovascular accident. PT was consulted. The patient does not need physical therapy at discharge.   DISCHARGE MEDICATIONS:  1. Aspirin 81 mg daily.  2. Imdur 30 mg daily.  3. Norvasc 10 mg daily.  4. Lasix 40 mg daily.  5. Nicotine patch 70 mg every 24 hours. 6. Hydralazine 50 mg p.o. three times daily.  DISCHARGE DIET: Low sodium.   DISCHARGE ACTIVITY: No exertion or heavy lifting.   DISCHARGE REFERRAL: [facility] and Dr. Nehemiah Massed. The patient will follow up with Dr. Nehemiah Massed in one week as well as the [facility].   TIME SPENT: 35 minutes.  ____________________________ Donell Beers. Benjie Karvonen, MD spm:slb D: 05/30/2012 13:59:14 ET     T: 05/30/2012 14:22:11 ET        JOB#: [id number] cc: Corey Skains, MD [facility] Kriya Westra P Peggi Yono MD ELECTRONICALLY SIGNED 05/31/2012 13:52

## 2015-04-07 NOTE — H&P (Signed)
PATIENT NAME:  WHELAN, SAHM MR#:  [id number] DATE OF BIRTH:  1946/02/02  DATE OF ADMISSION:  05/27/2012  PRIMARY CARE PHYSICIAN: None.  ER PHYSICIAN: Dr. Alger Simons  ADMITTING PHYSICIAN: Dr. Pearletha Furl   PRESENTING COMPLAINT: Headache and vomiting.   HISTORY OF PRESENT ILLNESS: Patient is a 69 year old male with known history of hypertension who has been off his medication for the last 6-7 months having some episodes of headache over the last two days and then this afternoon started vomiting. Vomited about 3 to 4 episodes which were nonbloody, large volume, recently ingested feeds. Denies any chest pain. No PND, orthopnea, pedal edema. Has some burning abdominal pain. No loss of consciousness. For this he presented to the Emergency Room today where he was noted to have elevated blood pressure with systolics in the [id number], received hydralazine in Emergency Room and was referred to hospitalist. While in the Emergency Room patient was noted to have weakness on the right side of his body with abnormal gait and was leaning towards the right. Had a CT head which shows no acute abnormality.   REVIEW OF SYSTEMS: CONSTITUTIONAL: Denies fever but admits to fatigue and weakness. No pain. No weight loss. No weight gain. EYES: No blurred vision or redness. No discharge. ENT: No tinnitus, ear pain, epistaxis, or difficulty swallowing. RESPIRATORY: Denies any cough, wheezing, or shortness of breath. CARDIOVASCULAR: No chest pain, orthopnea, pedal edema. No palpitations, syncopal episode or arrhythmia. GASTROINTESTINAL: Has some nausea and vomiting today. No diarrhea. Has some abdominal pain as stated above. No recent change in bowel habits. GENITOURINARY: No dysuria, frequency, incontinence. ENDOCRINE: No polyuria, polydipsia, heat or cold intolerance or excessive thirst. HEMATOLOGIC: No anemia, easy bruising, bleeding, or swollen glands. SKIN: No rashes, change in hair or skin texture. MUSCULOSKELETAL: No joint pain,  redness, swelling or limited activity. NEUROLOGICAL: No memory loss. Positive for headache and some mild weakness right side of the body but no numbness, vertigo. PSYCH: No anxiety, depression.   PAST MEDICAL HISTORY:  1. Hypertension. 2. Tobacco misuse. 3. Alcohol use. 4. Noncompliance to medication.   PAST SURGICAL HISTORY: Tonsillectomy.   SOCIAL HISTORY: Patient is retired from [employer]. He lives alone at home, Active cigarette smoker, about 3 to 5 cigarettes per day. Drinks about 1 to 2 shots of liquor every day.   FAMILY HISTORY: Positive for hypertension and diabetes in first-degree relatives. Also positive for coronary artery disease in brother. No history of CVA in family.   ALLERGIES: No known drug allergies.   MEDICATIONS: Patient not on any medication at the moment. Cannot recall his medication but has been off it for the last six months.   PHYSICAL EXAMINATION:  VITAL SIGNS: Temperature 98.7, pulse 83, respiratory rate 20, blood pressure 233/109, oxygen saturation 99% on room air.   GENERAL: Elderly male lying on the gurney, awake, alert, oriented to time, place, and person, in no obvious distress.   HEENT: Atraumatic, normocephalic. Pupils equal, reactive to light and accommodation. Extraocular movements intact. Mucous membranes pink, moist.   NECK: Supple. No JV distention.   CHEST: Good air entry. Clear to auscultation.   HEART: Regular rate and rhythm. No murmurs.   ABDOMEN: Full, moves with respiration, nontender. Bowel sounds normoactive. No organomegaly.   EXTREMITIES: No edema, clubbing. No deformity.   NEUROLOGICAL: Cranial nerves II through XII grossly intact. No focal motor or sensory deficit. No pronator drift. No facial droop. Power is 4/5 all limbs. Gait not tested.   LABORATORY, DIAGNOSTIC, AND RADIOLOGICAL  DATA: EKG showed sinus bradycardia, rate of 59, left ventricular hypertrophy voltage criteria and T wave inversion in lateral leads. No old  EKG for comparison. Chest x-ray showed no acute cardiopulmonary abnormality. CT head shows generalized cerebral atrophy but no acute abnormality. CBC unremarkable. Chemistry: Sodium 141, potassium 3.6. Creatinine 2.7, was 2.6 two years ago. BUN 24, glucose 134. Normal LFT. Troponin negative. Urinalysis is negative.   IMPRESSION:  1. Hypertensive emergency, due to medication non compliance. 2. Headache secondary to #1 above.  3. Nausea and vomiting secondary to #1 above. Consider gastritis.  4. Hypertensive encephalopathy as the primary cause for his transient right-sided weakness, to rule out cerebrovascular accident.  5. Tobacco misuse.  6. Chronic kidney disease, stable.  7. History of alcohol use, stable.   PLAN: Admit to the Intensive Care Unit for stepdown blood pressure control and for target systolics of Q000111Q to 99991111 over the next 12 hours. Will start patient on labetalol. Serial cardiac enzymes, TSH, magnesium, fasting lipid profile. Telemonitoring. Echocardiogram. MRI in a.m. if right-sided weakness persists. Aspirin, nitroglycerin, p.r.n. morphine. Nicotine patch offered. Smoking cessation advised. Check ETOH level, CIWA protocol prn. Patient advised on the need for compliance with medication.   CODE STATUS: FULL CODE.   TOTAL PATIENT CARE TIME: 50 minutes critical care.   ____________________________ Jules Husbands. Pearletha Furl, MD mia:cms D: 05/27/2012 00:01:20 ET T: 05/27/2012 06:02:35 ET JOB#: [id number]  cc: Shantee Hayne I. Pearletha Furl, MD, <Dictator> Carola Frost MD ELECTRONICALLY SIGNED 05/28/2012 1:19

## 2015-04-07 NOTE — Consult Note (Signed)
PATIENT NAME:  Kyle Short, Kyle Short MR#:  [id number] DATE OF BIRTH:  [birth year]  DATE OF CONSULTATION:  05/28/2012  REFERRING PHYSICIAN:  Dr. Holley Raring  CONSULTING PHYSICIAN:  Corey Skains, MD  PRIMARY CARE PHYSICIAN: None.   REASON FOR CONSULTATION: Malignant hypertension, heart failure, LV systolic dysfunction, chronic kidney disease.   CHIEF COMPLAINT: "I've had shortness of breath".  HISTORY OF PRESENT ILLNESS: This is a 69 year old male with known malignant hypertension in the past with abnormal EKG on appropriate medications in the past with new onset of shortness of breath, weakness, fatigue as well as lower extremity edema needing further treatment options. The patient has not been compliant with his medications and, therefore, has had some difficulty. He came in to the Emergency Room with malignant hypertension requiring nitroglycerin and other medication management including hydralazine. The patient has had an echocardiogram showing severe LV dysfunction, left ventricular hypertrophy, and valvular heart disease. The patient has had no evidence of chest pain or elevated troponin at this point. EKG has shown sinus bradycardia with left ventricular hypertrophy and repolarization. There is no evidence of acute myocardial infarction. The patient has chronic kidney disease which limits some of the medications that we may be able to use. Currently he is slightly improved after medication management and nitroglycerin.  Remainder of review of systems negative for vision change, ringing in the ears, hearing loss, cough, congestion, heartburn, nausea, vomiting, diarrhea, bloody stools, stomach pain, extremity pain, leg weakness, cramping of the buttocks, known blood clots, headaches, blackouts, dizzy spells, nosebleeds, congestion, trouble swallowing, frequent urination, urination at night, muscle weakness, numbness, anxiety, depression, skin lesions, skin rashes.   PAST MEDICAL HISTORY:   1. Hypertension.  2. Hyperlipidemia.  3. Abnormal EKG.  4. Chronic kidney disease.  5. Valvular heart disease.   FAMILY HISTORY: Multiple family history of cardiovascular disease at an early age.   SOCIAL HISTORY: Smokes one-half pack per day and drinks multiple alcohol every day.   ALLERGIES: He has no known drug allergies.   CURRENT MEDICATIONS: As listed.   PHYSICAL EXAMINATION:   VITAL SIGNS: Blood pressure 164/72 bilaterally, heart rate 70 upright, reclining, and regular.   GENERAL: He is a well appearing male in no acute distress.   HEENT: No icterus, thyromegaly, ulcers, hemorrhage, or xanthelasma.   CARDIOVASCULAR: Regular rate and rhythm with normal S1, S2 without murmur, gallop, or rub. PMI is diffuse. Carotid upstroke normal without bruit. Jugular venous pressure normal.   LUNGS: Lungs have few basilar crackles with normal respirations.   ABDOMEN: Soft, nontender without hepatosplenomegaly or masses. Abdominal aorta is normal size without bruit.   EXTREMITIES: 2+ bilateral pulses in dorsal, pedal, radial, and femoral arteries without lower extremity cyanosis, clubbing, or ulcers with trace lower extremity edema.   ASSESSMENT: This is a 69 year old male with malignant hypertension, cardiomyopathy, valvular heart disease, and hyperlipidemia having new onset of mild congestive heart failure needing further treatment options.   RECOMMENDATIONS:  1. Begin Lasix orally for risk reduction in lower extremity edema, pulmonary edema, and congestive heart failure watching for worsening chronic kidney disease. 2. Hypertension control with hydralazine and would use angiotensin receptor blocker as well if able watching for chronic kidney disease.  3. No use of beta-blocker at this time due to significant bradycardia seen on telemetry. 4. Possible further diagnostic testing and treatment options including stress test for cause of LV systolic dysfunction.  5. The patient was  counseled on the discontinuation of tobacco abuse.  6. The patient was counseled  on the discontinuation of significant alcohol use.  7. Further treatment options after ambulation.   ____________________________ Corey Skains, MD bjk:drc D: 05/28/2012 10:29:35 ET T: 05/28/2012 11:28:46 ET JOB#: [id number]  cc: Corey Skains, MD, <Dictator> Corey Skains MD ELECTRONICALLY SIGNED 06/01/2012 10:40

## 2015-04-09 DIAGNOSIS — I10 Essential (primary) hypertension: Secondary | ICD-10-CM | POA: Diagnosis not present

## 2015-04-09 DIAGNOSIS — I6529 Occlusion and stenosis of unspecified carotid artery: Secondary | ICD-10-CM | POA: Diagnosis not present

## 2015-04-09 DIAGNOSIS — E785 Hyperlipidemia, unspecified: Secondary | ICD-10-CM | POA: Diagnosis not present

## 2015-04-09 DIAGNOSIS — F17200 Nicotine dependence, unspecified, uncomplicated: Secondary | ICD-10-CM | POA: Diagnosis not present

## 2015-04-09 DIAGNOSIS — E119 Type 2 diabetes mellitus without complications: Secondary | ICD-10-CM | POA: Diagnosis not present

## 2015-04-14 NOTE — Discharge Summary (Signed)
PATIENT NAME:  Kyle Short, HERRMAN MR#:  [id number] DATE OF BIRTH:  1946/09/25  DATE OF ADMISSION:  12/12/2014 DATE OF DISCHARGE:  12/16/2014  ADMITTING DIAGNOSES:  1.  Sepsis--thought to be from right lower extremity cellulitis.  2.  Acute on chronic kidney disease.  3.  Hypomagnesemia.  4.  Anemia.  5.  Leukocytosis.  6.  Chronic alcohol abuse/ongoing tobacco abuse.  7.  Congestive heart failure. 8.  Right shoulder pain.   DISCHARGE DIAGNOSES:  1.  Sepsis from right lower extremity cellulitis.  2.  Acute on chronic kidney disease.  3.  Hypomagnesemia.  4.  Hypertension.  5.  Chronic alcohol abuse and ongoing tobacco abuse.  6.  Chronic systolic congestive heart failure with ejection fraction 20%.  7.  Severe right shoulder pain.   PROCEDURES: None.   CONSULTATIONS: Orthopedics and physical therapy.   BRIEF HISTORY AND PHYSICAL, AND HOSPITAL COURSE: Mr. Rivera is a 69 year old African American male who came into the ED with a chief complaint of fever. He has chronic alcohol abuse and ongoing tobacco abuse. Temperature in the ED was 101 degrees Fahrenheit. The patient was tachycardic at 104 and had leukocytosis of 14.8. The patient was confused at the time of admission. The patient was admitted to the hospital with a diagnosis of sepsis. Blood cultures and urine cultures were ordered. Chest x-ray was negative for infection. The patient was given IV Zosyn and vancomycin. The patient was started on gentle hydration and nephrotoxic agents were avoided.    HOSPITAL COURSE:  1.  Sepsis was thought to be from right lower extremity cellulitis. Blood cultures were negative. CT head was negative. Chest x-ray was negative. The patient clinically improved with vancomycin and Zosyn IV. All cultures being negative. The patient was treated for right lower extremity cellulitis, which significantly improved. Zosyn and vancomycin were discontinued and Bactrim was started on December 14, 2014. No DVT was  identified on ultrasound.  2.  Acute on chronic kidney disease. The patient's baseline seemed to be at 2.10. After giving gentle hydration, his baseline went down to 2.3. The plan is to avoid nephrotoxic agents including lisinopril.  3.  Hypomagnesemia. Repleted. Magnesium was normal subsequently.  4.  Hypertension. Blood pressure was elevated. Increased the Coreg to 12.5 mg p.o. b.i.d.  5.  Chronic alcohol abuse. The patient was placed on CIWA protocol and recommended outpatient followup with an alcohol rehab center.  6.  Chronic tobacco use. Smoking cessation counseling was provided for 3 to 5 minutes and recommended patient to continue nicotine patch.  7.  Congestive heart failure, systolic, chronic. No exacerbations were noticed. The plan is to continue his home medications and follow up with outpatient CHF Clinic.  8.  Severe right shoulder pain with limited range of motion. The patient reported a fall 1 week ago. X-ray was negative for any acute fractures. 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 Orthopedics consult was placed and PT consult was placed. The patient was subsequently feeling well even before ortho had evaluated the patient and he was discharged home with pain management. The patient was recommended to follow up with orthopedics as an outpatient as needed basis. I recommended him to get symptomatic treatment  Tylenol and ice packs and to follow up physical therapy recommendations. Overall condition significantly improved. Decision was made to discharge the patient home.   CONDITION AT THE TIME OF DISCHARGE: Stable.   ACTIVITY: As tolerated, as recommended by physical therapy.  PHYSICAL EXAMINATION:  VITAL SIGNS: Temperature 98.5, pulse 61, respirations 18, blood pressure is 147/87, pulse oximetry is 97%.  GENERAL APPEARANCE: Not in acute distress. Moderately built and nourished.  HEENT: Normocephalic, atraumatic. Pupils are equally reacting to  light and accommodation. No scleral icterus. No conjunctival injection. No sinus tenderness. No postnasal drip. Moist mucous membranes. NECK: Supple. No JVD. No thyromegaly. Range of motion is intact.  LUNGS: Clear to auscultation bilaterally. No accessory muscle use and no anterior chest wall tenderness on palpation.   CARDIOVASCULAR: S1, S2 normal. Regular rate and rhythm.  GASTROINTESTINAL: Soft. Bowel sounds are positive in all 4 quadrants. Nontender, nondistended. No masses felt.  NEUROLOGIC: Awake, alert, oriented x 3. Cranial nerves II through XII are grossly intact. Motor and sensory are intact.  EXTREMITIES: Lower extremities: No edema, no cyanosis, no clubbing. Upper extremities:  Right shoulder with limited range of motion from pain. Right lower extremity cellulitis significantly improved. Erythema and tenderness is significantly improved.   LABORATORY AND IMAGING STUDIES: On January 2, BUN 21, creatinine 2.39, sodium and potassium are normal, chloride 110. Serum calcium is 8.4. GFR 35. Anion gap is 8. WBC 11.4, hemoglobin 9.8, hematocrit 31.2, platelets are 221,000. Right shoulder x-ray: No acute bony abnormality. Chest x-ray, portable: No edema or consolidation, stable cardiac enlargement, probable nipple shadows bilaterally. CT of the head without contrast: No acute findings. 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 Lower extremity venous Doppler of the right leg: No DVT.   DISCHARGE MEDICATIONS:  1.  Isosorbide mononitrate 30 mg 1 tablet p.o. once daily.  2.  Lisinopril 20 mg p.o. once daily.  3.  Aspirin 81 mg p.o. once daily.  4.  Atorvastatin 10 mg p.o. at bedtime.  5.  Vitamin D 50,000 international units 1 capsule p.o. once a week. 6.  Trazodone 50 mg once daily at bedtime.  7.  MiraLax 1 dose orally once a day.  8.  Coreg 12.5 mg p.o. b.i.d.  9.  Tylenol 325 mg 2 tablets p.o. every 4 hours as needed for mild pain  or temperature greater than 100.4.  10.  Bactrim double strength 1 tablet p.o. 2 times per day. 11.  Hydralazine 25 mg 1 tablet p.o. 4 times a day. 12.  Folic acid 1 mg p.o. once daily.  13.  Thiamine 50 mg p.o. once daily.  14.  Multivitamin 1 tablet p.o. once daily.   DIET: Low sodium, low fat.   ACTIVITY: As tolerated.   FOLLOWUP: Follow up with PCP in 1 week; cardiology in 1 week. Follow up with CHF Clinic in a week. Follow up with orthopedics as needed basis. Outpatient physical therapy as recommended by inpatient physical therapy the following day for evaluation.   The diagnosis and plan of care was discussed in detail with the patient. He verbalized understanding of the plan.   TOTAL TIME SPENT ON DISCHARGE AND COORDINATION OF CARE: 45 minutes.    ____________________________ Nicholes Mango, MD ag:ts D: 12/22/2014 19:13:40 ET T: 12/23/2014 04:35:04 ET JOB#: [id number]  cc: Nicholes Mango, MD, <Dictator> Nicholes Mango MD ELECTRONICALLY SIGNED 12/24/2014 22:34

## 2015-06-06 ENCOUNTER — Emergency Department
Admission: EM | Admit: 2015-06-06 | Discharge: 2015-06-07 | Disposition: A | Discharge status: Home / Self Care | Payer: Medicare Other | Attending: Student | Admitting: Student

## 2015-06-06 ENCOUNTER — Encounter: Payer: Self-pay | Admitting: *Deleted

## 2015-06-06 DIAGNOSIS — F0281 Dementia in other diseases classified elsewhere with behavioral disturbance: Secondary | ICD-10-CM

## 2015-06-06 DIAGNOSIS — E119 Type 2 diabetes mellitus without complications: Secondary | ICD-10-CM | POA: Insufficient documentation

## 2015-06-06 DIAGNOSIS — N189 Chronic kidney disease, unspecified: Secondary | ICD-10-CM | POA: Diagnosis not present

## 2015-06-06 DIAGNOSIS — F0390 Unspecified dementia without behavioral disturbance: Secondary | ICD-10-CM | POA: Diagnosis not present

## 2015-06-06 DIAGNOSIS — I129 Hypertensive chronic kidney disease with stage 1 through stage 4 chronic kidney disease, or unspecified chronic kidney disease: Secondary | ICD-10-CM | POA: Insufficient documentation

## 2015-06-06 DIAGNOSIS — R4689 Other symptoms and signs involving appearance and behavior: Secondary | ICD-10-CM

## 2015-06-06 DIAGNOSIS — F6089 Other specific personality disorders: Secondary | ICD-10-CM | POA: Diagnosis not present

## 2015-06-06 DIAGNOSIS — F911 Conduct disorder, childhood-onset type: Secondary | ICD-10-CM | POA: Insufficient documentation

## 2015-06-06 DIAGNOSIS — R45850 Homicidal ideations: Secondary | ICD-10-CM

## 2015-06-06 DIAGNOSIS — G308 Other Alzheimer's disease: Secondary | ICD-10-CM | POA: Diagnosis not present

## 2015-06-06 DIAGNOSIS — G309 Alzheimer's disease, unspecified: Secondary | ICD-10-CM

## 2015-06-06 DIAGNOSIS — Z046 Encounter for general psychiatric examination, requested by authority: Secondary | ICD-10-CM | POA: Diagnosis present

## 2015-06-06 DIAGNOSIS — N289 Disorder of kidney and ureter, unspecified: Secondary | ICD-10-CM

## 2015-06-06 HISTORY — DX: Problems related to living in residential institution: Z59.3

## 2015-06-06 HISTORY — DX: Other specified health status: Z78.9

## 2015-06-06 LAB — COMPREHENSIVE METABOLIC PANEL
ALT: 20 U/L (ref 17–63)
AST: 27 U/L (ref 15–41)
Albumin: 4 g/dL (ref 3.5–5.0)
Alkaline Phosphatase: 64 U/L (ref 38–126)
Anion gap: 8 (ref 5–15)
BUN: 38 mg/dL — ABNORMAL HIGH (ref 6–20)
CALCIUM: 9.2 mg/dL (ref 8.9–10.3)
CO2: 24 mmol/L (ref 22–32)
CREATININE: 3.37 mg/dL — AB (ref 0.61–1.24)
Chloride: 107 mmol/L (ref 101–111)
GFR, EST AFRICAN AMERICAN: 20 mL/min — AB (ref >60)
GFR, EST NON AFRICAN AMERICAN: 17 mL/min — AB (ref >60)
GLUCOSE: 95 mg/dL (ref 65–99)
Potassium: 4.6 mmol/L (ref 3.5–5.1)
Sodium: 139 mmol/L (ref 135–145)
Total Bilirubin: 0.4 mg/dL (ref 0.3–1.2)
Total Protein: 8.7 g/dL — ABNORMAL HIGH (ref 6.5–8.1)

## 2015-06-06 LAB — CBC WITH DIFFERENTIAL/PLATELET
Basophils Absolute: 0 10*3/uL (ref 0–0.1)
Basophils Relative: 1 %
EOS ABS: 0.1 10*3/uL (ref 0–0.7)
EOS PCT: 2 %
HCT: 33.3 % — ABNORMAL LOW (ref 40.0–52.0)
Hemoglobin: 10.3 g/dL — ABNORMAL LOW (ref 13.0–18.0)
LYMPHS ABS: 2.5 10*3/uL (ref 1.0–3.6)
Lymphocytes Relative: 49 %
MCH: 27.1 pg (ref 26.0–34.0)
MCHC: 31 g/dL — ABNORMAL LOW (ref 32.0–36.0)
MCV: 87.4 fL (ref 80.0–100.0)
MONOS PCT: 14 %
Monocytes Absolute: 0.7 10*3/uL (ref 0.2–1.0)
NEUTROS ABS: 1.7 10*3/uL (ref 1.4–6.5)
Neutrophils Relative %: 34 %
Platelets: 210 10*3/uL (ref 150–440)
RBC: 3.81 MIL/uL — AB (ref 4.40–5.90)
RDW: 15.1 % — AB (ref 11.5–14.5)
WBC: 5.1 10*3/uL (ref 3.8–10.6)

## 2015-06-06 LAB — SALICYLATE LEVEL: Salicylate Lvl: <4 mg/dL (ref 2.8–30.0)

## 2015-06-06 LAB — ETHANOL

## 2015-06-06 LAB — ACETAMINOPHEN LEVEL: Acetaminophen (Tylenol), Serum: <10 ug/mL — ABNORMAL LOW (ref 10–30)

## 2015-06-06 MED ORDER — SODIUM CHLORIDE 0.9 % IV BOLUS (SEPSIS)
500.0000 mL | Freq: Once | INTRAVENOUS | Status: AC
  Administered 2015-06-06: 500 mL via INTRAVENOUS

## 2015-06-06 NOTE — ED Notes (Signed)
Pt. Urinated in bed.  Pt. Cleaned up and changed.  Pt. Put in a depends under garment and absorbant pads put on stretcher.

## 2015-06-06 NOTE — ED Notes (Signed)

## 2015-06-06 NOTE — ED Notes (Signed)

## 2015-06-06 NOTE — ED Notes (Signed)
BEHAVIORAL HEALTH ROUNDING Patient sleeping: No. Patient alert and oriented: yes Behavior appropriate: Yes.  ; If no, describe:  Nutrition and fluids offered: Yes  Toileting and hygiene offered: Yes  Sitter present: no Law enforcement present: Yes  

## 2015-06-06 NOTE — ED Provider Notes (Signed)
[facility] Emergency Department Provider Note  ____________________________________________  Time seen: Approximately 5:35 PM  I have reviewed the triage vital signs and the nursing notes.   HISTORY  Chief Complaint Medical Clearance  Caveat-history of present illness and review of systems limited secondary to the patient's dementia. All review of systems and history of present illness obtained from staff at his family care home.  HPI Kyle Short is a 69 y.o. male with history of dementia, diabetes, chronic kidney disease, alcoholism, CHF, hypertension resents under IVC for threatening behavior, aggression, threats of homicide at family care home. The patient has been wandering away from the family care home, going to fast food restaurants, going off premises to drink alcohol. Staff at his facility reports that today he reported that he was going to "F*@$ them up", he has "swung at staff". Patient denies this. Symptoms have been occurring on and off for the past week, current severity is moderate to severe. He denies any recent illness including any coughing, sneezing, runny nose, congestion. He denies any suicidal ideation, homicidal ideation or audiovisual hallucinations. He denies any pain complaints.   Past Medical History  Diagnosis Date  . Hypertension   . Lives in group home     There are no active problems to display for this patient.   History reviewed. No pertinent past surgical history.  No current outpatient prescriptions on file.  Allergies Review of patient's allergies indicates no known allergies.  No family history on file.  Social History History  Substance Use Topics  . Smoking status: Current Every Day Smoker -- 1.00 packs/day    Types: Cigarettes  . Smokeless tobacco: Not on file  . Alcohol Use: No    Review of Systems Constitutional: No fever/chills Eyes: No visual changes. ENT: No sore throat. Cardiovascular: Denies  chest pain. Respiratory: Denies shortness of breath. Gastrointestinal: No abdominal pain.  No nausea, no vomiting.  No diarrhea.  No constipation. Genitourinary: Negative for dysuria. Musculoskeletal: Negative for back pain. Skin: Negative for rash. Neurological: Negative for headaches, focal weakness or numbness.  10-point ROS otherwise negative.   Caveat-history of present illness and review of systems limited secondary to the patient's dementia. All review of systems and history of present illness obtained from staff at his family care home. ____________________________________________   PHYSICAL EXAM:  VITAL SIGNS: ED Triage Vitals  Enc Vitals Group     BP 06/06/15 1624 132/79 mmHg     Pulse Rate 06/06/15 1624 65     Resp 06/06/15 1624 20     Temp 06/06/15 1624 98.3 F (36.8 C)     Temp Source 06/06/15 1624 Oral     SpO2 06/06/15 1624 98 %     Weight 06/06/15 1624 160 lb (72.576 kg)     Height 06/06/15 1624 6\' 2"  (1.88 m)     Head Cir --      Peak Flow --      Pain Score 06/06/15 1625 0     Pain Loc --      Pain Edu? --      Excl. in [place]? --     Constitutional: Alert and oriented to self and place. Well appearing and in no acute distress. Sitting up in hallway ed eating sandwich and chips. Eyes: Conjunctivae are normal.  EOMI. Head: Atraumatic. Nose: No congestion/rhinnorhea. Mouth/Throat: Mucous membranes are moist.  Oropharynx non-erythematous. Neck: No stridor.   Cardiovascular: Normal rate, regular rhythm. Grossly normal heart sounds.  Good peripheral  circulation. Respiratory: Normal respiratory effort.  No retractions. Lungs CTAB. Gastrointestinal: Soft and nontender. No distention. No abdominal bruits. No CVA tenderness. Genitourinary: deferred Musculoskeletal: No lower extremity tenderness nor edema.  No joint effusions. Neurologic:  Normal speech and language. No gross focal neurologic deficits are appreciated. Speech is normal. No gait instability. Skin:   Skin is warm, dry and intact. No rash noted. Psychiatric: Mood and affect are normal. Speech and behavior are normal.  ____________________________________________   LABS (all labs ordered are listed, but only abnormal results are displayed)  Labs Reviewed  CBC WITH DIFFERENTIAL/PLATELET - Abnormal; Notable for the following:    RBC 3.81 (*)    Hemoglobin 10.3 (*)    HCT 33.3 (*)    MCHC 31.0 (*)    RDW 15.1 (*)    All other components within normal limits  COMPREHENSIVE METABOLIC PANEL - Abnormal; Notable for the following:    BUN 38 (*)    Creatinine, Ser 3.37 (*)    Total Protein 8.7 (*)    GFR calc non Af Amer 17 (*)    GFR calc Af Amer 20 (*)    All other components within normal limits  ACETAMINOPHEN LEVEL - Abnormal; Notable for the following:    Acetaminophen (Tylenol), Serum <10 (*)    All other components within normal limits  ETHANOL  SALICYLATE LEVEL  URINALYSIS COMPLETEWITH MICROSCOPIC ([facility] ONLY)  URINE DRUG SCREEN, QUALITATIVE ([facility] ONLY)  COMPREHENSIVE METABOLIC PANEL   ____________________________________________  EKG  none ____________________________________________  RADIOLOGY  none ____________________________________________   PROCEDURES  Procedure(s) performed: None  Critical Care performed: No  ____________________________________________   INITIAL IMPRESSION / ASSESSMENT AND PLAN / ED COURSE  Pertinent labs & imaging results that were available during my care of the patient were reviewed by me and considered in my medical decision making (see chart for details).  Kyle Short is a 69 y.o. male with history of dementia, diabetes, chronic kidney disease, alcoholism, CHF, hypertension resents under IVC for threatening behavior, aggression, threats of homicide at family care home. Currently, vital signs stable, he is afebrile, sitting up in bed eating chips. He has no acute medical complaints. He has a benign physical exam. We'll  continue IVC, consult behavioral health and psychiatry. Labs are notable for chronic anemia which is stable/at baseline. Creatinine slightly elevated at 3.37. Baseline appears to be 2.5. Will give light IV fluids and recheck it in the morning. ____________________________________________   FINAL CLINICAL IMPRESSION(S) / ED DIAGNOSES  Final diagnoses:  Homicidal ideations  Aggression aggravated      Joanne Gavel, MD 06/06/15 2356

## 2015-06-06 NOTE — ED Notes (Signed)
Pt. Is unsure why he is here.  Pt. Likes group home he is in.  Pt. denies he is SI or HI.

## 2015-06-06 NOTE — ED Notes (Signed)
Pt is from group home.  Pt is diabetic and has been wondering off (he wanted to get a cheeseburger) and his sister took out IVC paperwork.  Pt is smiling and calm and cooperative here with [place] [employer].  POC discussed

## 2015-06-06 NOTE — BH Assessment (Signed)
Assessment Note  Kyle Short is an 69 y.o. male, who presents to the ED via the police under IVC; was petitioned by his sister for c/o being aggressive at the care home; with hitting staff; threatening; wandering away from the care home. Per Mariann Laster at the care home([phone])  @ 20:48, "he kept walking away; threatening to hurt people; he's real hateful; he will try to fight you; he came here in January from another facility; he was doing the same thing; we don't have the staff to go after him each time; he needs to be in a locked facility." Per sister--Mable [phone]), "his mind is not that good; he was diagnosed with Dementia (3) years ago; today, they called me; they told me; he walked off again. I went to look for him; he was standing on the side of the road wet with sweat. 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 And have health problems. He needs to be in a locked place."  Axis I: See current hospital problem list Axis II: Deferred Axis III:  Past Medical History  Diagnosis Date  . Hypertension   . Lives in group home    Axis IV: housing problems, other psychosocial or environmental problems, problems with access to health care services and problems with primary support group Axis V: 31-40 impairment in reality testing  Past Medical History:  Past Medical History  Diagnosis Date  . Hypertension   . Lives in group home     History reviewed. No pertinent past surgical history.  Family History: No family history on file.  Social History:  reports that he has been smoking Cigarettes.  He has been smoking about 1.00 pack per day. He does not have any smokeless tobacco history on file. He reports that he does not drink alcohol. His drug history  is not on file.  Additional Social History:     CIWA: CIWA-Ar BP: 132/79 mmHg Pulse Rate: 65 COWS:    Allergies: No Known Allergies  Home Medications:  (Not in a hospital admission)  OB/GYN Status:  No LMP for male patient.  General Assessment Data Location of Assessment: [facility] ED TTS Assessment: In system Is this a Tele or Face-to-Face Assessment?: Face-to-Face Is this an Initial Assessment or a Re-assessment for this encounter?: Re-Assessment Marital status: Single Maiden name: n/a Is patient pregnant?: No Pregnancy Status: No Living Arrangements: Other (Comment) ([place]) Can pt return to current living arrangement?: No (per staff, "he needs to be in a locked place.") Admission Status: Involuntary Is patient capable of signing voluntary admission?: No Referral Source: Self/Family/Friend Insurance type: Medicare  Medical Screening Exam ([place]) Medical Exam completed: Yes  Crisis Care Plan Living Arrangements: Other (Comment) ([place]) Name of Psychiatrist: none Name of Therapist: none  Education Status Is patient currently in school?: No Current Grade: n/a Highest grade of 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 : No Suicidal Intent: No-Not Currently/Within Last 6 Months Has patient had any suicidal  intent within the past 6 months prior to admission? : No Is patient at risk for suicide?: No (has Dementia---wanders) Suicidal Plan?: No Has patient had any suicidal plan within the past 6 months prior to admission? : No Access to Means: No What has been your use of drugs/alcohol within the last 12 months?: per sister, "he had a real bad crack cocaine habit." Previous Attempts/Gestures: No How many  times?: 0 Other Self Harm Risks: 0 Triggers for Past Attempts: None known Intentional Self Injurious Behavior: None Family Suicide History: No Recent stressful life event(s):  (wandered away from the care home stating that, he wanted to ) Persecutory voices/beliefs?:  (has Dementia) Depression: No Depression Symptoms:  (UTA) Substance abuse history and/or treatment for substance abuse?: Yes Suicide prevention information given to non-admitted patients: Not applicable  Risk to Others within the past 6 months Homicidal Ideation: No Does patient have any lifetime risk of violence toward others beyond the six months prior to admission? : Unknown Thoughts of Harm to Others:  (per care home staff--Wanda, :"he is real hatefull; threateni) Current Homicidal Intent: No Current Homicidal Plan: No Access to Homicidal Means: No History of harm to others?: No Assessment of Violence: On admission Violent Behavior Description: none noted while in the ED Does patient have access to weapons?: No Criminal Charges Pending?: No Does patient have a court date: No Is patient on probation?: No  Psychosis Hallucinations:  (UTA; has dementia) Delusions:  (UTA--has dementia)  Mental Status Report Appearance/Hygiene: In scrubs Eye Contact: Fair Motor Activity: Unremarkable Speech: Incoherent, Slow Level of Consciousness: Quiet/awake Mood:  (labile) Affect:  (labile) Anxiety Level: Minimal Thought Processes: Unable to Assess Judgement: Unable to Assess Orientation: Person Obsessive Compulsive Thoughts/Behaviors: Unable to Assess  Cognitive Functioning Concentration: Unable to Assess Memory: Unable to Assess Insight: Poor Impulse Control: Poor Appetite: Fair Sleep: Decreased Total Hours of Sleep: 4 Vegetative Symptoms: Unable to Assess  ADLScreening [facility] Assessment Services) Patient's cognitive ability adequate to safely complete daily activities?: No Patient able to express need for  assistance with ADLs?: Yes Independently performs ADLs?: No  Prior Inpatient Therapy Prior Inpatient Therapy: No  Prior Outpatient Therapy Prior Outpatient Therapy: No Does patient have an ACCT team?: No Does patient have Intensive In-House Services?  : No Does patient have [facility] services? : No Does patient have P4CC services?: No  ADL Screening (condition at time of admission) Patient's cognitive ability adequate to safely complete daily activities?: No Patient able to express need for assistance with ADLs?: Yes Independently performs ADLs?: No       Abuse/Neglect Assessment (Assessment to be complete while patient is alone) Physical Abuse: Denies Verbal Abuse: Denies Sexual Abuse: Denies Self-Neglect: Denies Values / Beliefs Cultural Requests During Hospitalization: None Consults Spiritual Care Consult Needed: No Social Work Consult Needed: No      Additional Information 1:1 In Past 12 Months?: No CIRT Risk: No Elopement Risk: No Does patient have medical clearance?: Yes  Child/Adolescent Assessment Running Away Risk: Admits Running Away Risk as evidence by: walking away from care home/wanders Bed-Wetting: Denies Destruction of Property: Denies Cruelty to Animals: Denies Stealing: Denies Rebellious/Defies Authority: Denies Scientist, research (medical) Involvement: Denies Science writer: Denies Problems at [employer]: Denies Gang Involvement: Denies  Disposition:  Disposition Initial Assessment Completed for this Encounter: Yes Disposition of Patient: Referred to (Psych MD to see) Patient referred to: Other (Comment) (Consult)  On Site Evaluation by:   Reviewed with Physician:    Maris Berger 06/06/2015 9:47 PM

## 2015-06-06 NOTE — ED Notes (Signed)
Pt. Urinated in bed.  Pt. Sheets and clothes replaced.  Pt. Cleaned up.  Put absorbant pads put on bed.

## 2015-06-06 NOTE — ED Notes (Signed)
BEHAVIORAL HEALTH ROUNDING Patient sleeping: Yes.   Patient alert and oriented: no Behavior appropriate: Yes.  ; If no, describe:  Nutrition and fluids offered: No Toileting and hygiene offered: No Sitter present: no Law enforcement present: Yes

## 2015-06-07 DIAGNOSIS — G308 Other Alzheimer's disease: Secondary | ICD-10-CM

## 2015-06-07 DIAGNOSIS — G309 Alzheimer's disease, unspecified: Secondary | ICD-10-CM

## 2015-06-07 DIAGNOSIS — N289 Disorder of kidney and ureter, unspecified: Secondary | ICD-10-CM

## 2015-06-07 DIAGNOSIS — F0281 Dementia in other diseases classified elsewhere with behavioral disturbance: Secondary | ICD-10-CM

## 2015-06-07 LAB — URINE DRUG SCREEN, QUALITATIVE (ARMC ONLY)
Amphetamines, Ur Screen: NOT DETECTED
BENZODIAZEPINE, UR SCRN: NOT DETECTED
Barbiturates, Ur Screen: NOT DETECTED
COCAINE METABOLITE, UR ~~LOC~~: NOT DETECTED
Cannabinoid 50 Ng, Ur ~~LOC~~: NOT DETECTED
MDMA (Ecstasy)Ur Screen: NOT DETECTED
Methadone Scn, Ur: NOT DETECTED
Opiate, Ur Screen: NOT DETECTED
Phencyclidine (PCP) Ur S: NOT DETECTED
TRICYCLIC, UR SCREEN: NOT DETECTED

## 2015-06-07 LAB — URINALYSIS COMPLETE WITH MICROSCOPIC (ARMC ONLY)
Bilirubin Urine: NEGATIVE
Glucose, UA: NEGATIVE mg/dL
Hgb urine dipstick: NEGATIVE
KETONES UR: NEGATIVE mg/dL
Nitrite: NEGATIVE
PH: 7 (ref 5.0–8.0)
Protein, ur: NEGATIVE mg/dL
SPECIFIC GRAVITY, URINE: 1.009 (ref 1.005–1.030)

## 2015-06-07 NOTE — Progress Notes (Signed)
Call to patient's sister--Mable [phone])  Patient resides at [facility] group home informed CSW patient's daughter is the POA,  she is concerned with patient going back to group home.  Call to group home [phone]) spoke to [facility] the care giver.  Unable to pick patient up states his sister will have to transport him back to group home.  Patient's sisters Pamala Hurry [phone] in [place] and Bethena Roys [phone] in [place].   Call to patient's daughter Davy Pique [phone] left message to call CSW.    Patient assessed by Psych MD with recommendation to discharge back to group home.   Casimer Lanius. Latanya Presser, MSW Clinical Social Work Department Emergency Room [phone] 3:35 PM

## 2015-06-07 NOTE — ED Notes (Signed)
BEHAVIORAL HEALTH ROUNDING Patient sleeping: No. Patient alert and oriented: yes Behavior appropriate: Yes.  ;  Nutrition and fluids offered: Yes  Toileting and hygiene offered: Yes  Sitter present: yes Law enforcement present: Yes  

## 2015-06-07 NOTE — Consult Note (Signed)
[facility] Face-to-Face Psychiatry Consult   Reason for Consult:  Consult for 69 year old man with unknown past psychiatric history other than a reported history of alcohol abuse.  Referring Physician:  Thomasene Lot Patient Identification: Kyle Short MRN:  [id number] Principal Diagnosis: Congestive heart failure Diagnosis:   Patient Active Problem List   Diagnosis Date Noted  . Dementia, Alzheimer's, with behavior disturbance [G30.8] 06/07/2015  . Congestive heart failure [I50.9] 06/07/2015  . Renal insufficiency [N28.9] 06/07/2015    Total Time spent with patient: 45 minutes  Subjective:   Kyle Short is a 69 y.o. male patient admitted with patient was brought here apparently from a group home or some other kind of living facility without any attached history other than a report that he had been wandering away from the facility and possibly had been "combative"..  HPI:  Information from the chart and from the patient neither 1 of which are particularly full of information. Patient's commitment paperwork reports that he is demented and that he had been wandering away from the place where he is living and had been somehow agitated in his behavior. In interview today the patient himself has no complaints at all. He was aware that he was in a hospital but couldn't tell me why he was here at he denied that he had been having any kind of behavior problems. Denied any depression and anger or mood symptoms. Denied any hallucinations. Denied any wish to harm anybody else or himself. Currently we don't have a list of any medications that he might be taking although presumably he is on medicine for his multiple medical problems. He is not able to articulate any particular new stresses.  Past psychiatric history: Patient clearly has dementia. review of the old records shows extensive history of medical problems including congestive heart failure renal insufficiency. Alcohol abuse as mentioned. Can't find  any real evidence of past psychiatric symptoms specifically. Patient denies any history of psychiatric hospitalization or suicide.  Social history: Unknown whether he has family working with him more involved. Apparently has been living in some kind of living facility recently.  Medical history: Extensive with congestive heart failure and renal insufficiency and multiple other problems. Appears to probably been on a long list of medicines at least as of the last time he was here in the emergency room.  Family history: He denies any family history of mental illness  Substance abuse history: Patient says that he used to drink but doesn't drink anymore. He doesn't give me any more detail than that. That's about as much as I can find in the chart as well. Denies any history of drug abuse.  HPI Elements:   Quality:  Dementia moderate to severe with wandering behavior. Severity:  Mild to moderate behavior problems. Timing:  Chronic. Duration:  Chronic. Context:  Unknown what the context is.  Past Medical History:  Past Medical History  Diagnosis Date  . Hypertension   . Lives in group home    History reviewed. No pertinent past surgical history. Family History: No family history on file. Social History:  History  Alcohol Use No     History  Drug Use Not on file    History   Social History  . Marital Status: Single    Spouse Name: N/A  . Number of Children: N/A  . Years of Education: N/A   Social History Main Topics  . Smoking status: Current Every Day Smoker -- 1.00 packs/day    Types: Cigarettes  .  Smokeless tobacco: Not on file  . Alcohol Use: No  . Drug Use: Not on file  . Sexual Activity: Not on file   Other Topics Concern  . None   Social History Narrative   Additional Social History:                          Allergies:  No Known Allergies  Labs:  Results for orders placed or performed during the hospital encounter of 06/06/15 (from the past 48  hour(s))  CBC WITH DIFFERENTIAL     Status: Abnormal   Collection Time: 06/06/15  5:02 PM  Result Value Ref Range   WBC 5.1 3.8 - 10.6 K/uL   RBC 3.81 (L) 4.40 - 5.90 MIL/uL   Hemoglobin 10.3 (L) 13.0 - 18.0 g/dL   HCT 33.3 (L) 40.0 - 52.0 %   MCV 87.4 80.0 - 100.0 fL   MCH 27.1 26.0 - 34.0 pg   MCHC 31.0 (L) 32.0 - 36.0 g/dL   RDW 15.1 (H) 11.5 - 14.5 %   Platelets 210 150 - 440 K/uL   Neutrophils Relative % 34 %   Neutro Abs 1.7 1.4 - 6.5 K/uL   Lymphocytes Relative 49 %   Lymphs Abs 2.5 1.0 - 3.6 K/uL   Monocytes Relative 14 %   Monocytes Absolute 0.7 0.2 - 1.0 K/uL   Eosinophils Relative 2 %   Eosinophils Absolute 0.1 0 - 0.7 K/uL   Basophils Relative 1 %   Basophils Absolute 0.0 0 - 0.1 K/uL  Comprehensive metabolic panel     Status: Abnormal   Collection Time: 06/06/15  5:02 PM  Result Value Ref Range   Sodium 139 135 - 145 mmol/L   Potassium 4.6 3.5 - 5.1 mmol/L   Chloride 107 101 - 111 mmol/L   CO2 24 22 - 32 mmol/L   Glucose, Bld 95 65 - 99 mg/dL   BUN 38 (H) 6 - 20 mg/dL   Creatinine, Ser 3.37 (H) 0.61 - 1.24 mg/dL   Calcium 9.2 8.9 - 10.3 mg/dL   Total Protein 8.7 (H) 6.5 - 8.1 g/dL   Albumin 4.0 3.5 - 5.0 g/dL   AST 27 15 - 41 U/L   ALT 20 17 - 63 U/L   Alkaline Phosphatase 64 38 - 126 U/L   Total Bilirubin 0.4 0.3 - 1.2 mg/dL   GFR calc non Af Amer 17 (L) >60 mL/min   GFR calc Af Amer 20 (L) >60 mL/min    Comment: (NOTE) The eGFR has been calculated using the CKD EPI equation. This calculation has not been validated in all clinical situations. eGFR's persistently <60 mL/min signify possible Chronic Kidney Disease.    Anion gap 8 5 - 15  Ethanol     Status: None   Collection Time: 06/06/15  5:02 PM  Result Value Ref Range   Alcohol, Ethyl (B) <5 <5 mg/dL    Comment:        LOWEST DETECTABLE LIMIT FOR SERUM ALCOHOL IS 5 mg/dL FOR MEDICAL PURPOSES ONLY   Urine Drug Screen, Qualitative ([facility] only)     Status: None   Collection Time: 06/06/15  5:02  PM  Result Value Ref Range   Tricyclic, Ur Screen NONE DETECTED NONE DETECTED   Amphetamines, Ur Screen NONE DETECTED NONE DETECTED   MDMA (Ecstasy)Ur Screen NONE DETECTED NONE DETECTED   Cocaine Metabolite,Ur [place] NONE DETECTED NONE DETECTED   Opiate, Ur Screen NONE DETECTED  NONE DETECTED   Phencyclidine (PCP) Ur S NONE DETECTED NONE DETECTED   Cannabinoid 50 Ng, Ur [place] NONE DETECTED NONE DETECTED   Barbiturates, Ur Screen NONE DETECTED NONE DETECTED   Benzodiazepine, Ur Scrn NONE DETECTED NONE DETECTED   Methadone Scn, Ur NONE DETECTED NONE DETECTED    Comment: (NOTE) 038  Tricyclics, urine               Cutoff 1000 ng/mL 200  Amphetamines, urine             Cutoff 1000 ng/mL 300  MDMA (Ecstasy), urine           Cutoff 500 ng/mL 400  Cocaine Metabolite, urine       Cutoff 300 ng/mL 500  Opiate, urine                   Cutoff 300 ng/mL 600  Phencyclidine (PCP), urine      Cutoff 25 ng/mL 700  Cannabinoid, urine              Cutoff 50 ng/mL 800  Barbiturates, urine             Cutoff 200 ng/mL 900  Benzodiazepine, urine           Cutoff 200 ng/mL 1000 Methadone, urine                Cutoff 300 ng/mL 1100 1200 The urine drug screen provides only a preliminary, unconfirmed 1300 analytical test result and should not be used for non-medical 1400 purposes. Clinical consideration and professional judgment should 1500 be applied to any positive drug screen result due to possible 1600 interfering substances. A more specific alternate chemical method 1700 must be used in order to obtain a confirmed analytical result.  1800 Gas chromato graphy / mass spectrometry (GC/MS) is the preferred 1900 confirmatory method.   Acetaminophen level     Status: Abnormal   Collection Time: 06/06/15  5:02 PM  Result Value Ref Range   Acetaminophen (Tylenol), Serum <10 (L) 10 - 30 ug/mL    Comment:        THERAPEUTIC CONCENTRATIONS VARY SIGNIFICANTLY. A RANGE OF 10-30 ug/mL MAY BE AN  EFFECTIVE CONCENTRATION FOR MANY PATIENTS. HOWEVER, SOME ARE BEST TREATED AT CONCENTRATIONS OUTSIDE THIS RANGE. ACETAMINOPHEN CONCENTRATIONS >150 ug/mL AT 4 HOURS AFTER INGESTION AND >50 ug/mL AT 12 HOURS AFTER INGESTION ARE OFTEN ASSOCIATED WITH TOXIC REACTIONS.   Salicylate level     Status: None   Collection Time: 06/06/15  5:02 PM  Result Value Ref Range   Salicylate Lvl <3.3 2.8 - 30.0 mg/dL  Urinalysis complete, with microscopic ([facility] only)     Status: Abnormal   Collection Time: 06/06/15  5:05 PM  Result Value Ref Range   Color, Urine STRAW (A) YELLOW   APPearance CLEAR (A) CLEAR   Glucose, UA NEGATIVE NEGATIVE mg/dL   Bilirubin Urine NEGATIVE NEGATIVE   Ketones, ur NEGATIVE NEGATIVE mg/dL   Specific Gravity, Urine 1.009 1.005 - 1.030   Hgb urine dipstick NEGATIVE NEGATIVE   pH 7.0 5.0 - 8.0   Protein, ur NEGATIVE NEGATIVE mg/dL   Nitrite NEGATIVE NEGATIVE   Leukocytes, UA TRACE (A) NEGATIVE   RBC / HPF 0-5 0 - 5 RBC/hpf   WBC, UA 0-5 0 - 5 WBC/hpf   Bacteria, UA MANY (A) NONE SEEN   Squamous Epithelial / LPF 0-5 (A) NONE SEEN    Vitals: Blood pressure 132/79, pulse 65, temperature 98.3 F (36.8 C),  temperature source Oral, resp. rate 20, height 6' 2" (1.88 m), weight 72.576 kg (160 lb), SpO2 98 %.  Risk to Self: Suicidal Ideation: No-Not Currently/Within Last 6 Months Suicidal Intent: No-Not Currently/Within Last 6 Months Is patient at risk for suicide?: No (has Dementia---wanders) Suicidal Plan?: No Access to Means: No What has been your use of drugs/alcohol within the last 12 months?: per sister, "he had a real bad crack cocaine habit." How many times?: 0 Other Self Harm Risks: 0 Triggers for Past Attempts: None known Intentional Self Injurious Behavior: None Risk to Others: Homicidal Ideation: No Thoughts of Harm to Others:  (per care home staff--Wanda, :"he is real hatefull; threateni) Current Homicidal Intent: No Current Homicidal Plan: No Access to  Homicidal Means: No History of harm to others?: No Assessment of Violence: On admission Violent Behavior Description: none noted while in the ED Does patient have access to weapons?: No Criminal Charges Pending?: No Does patient have a court date: No Prior Inpatient Therapy: Prior Inpatient Therapy: No Prior Outpatient Therapy: Prior Outpatient Therapy: No Does patient have an ACCT team?: No Does patient have Intensive In-House Services?  : No Does patient have [facility] services? : No Does patient have P4CC services?: No  No current facility-administered medications for this encounter.   No current outpatient prescriptions on file.    Musculoskeletal: Strength & Muscle Tone: within normal limits Gait & Station: Not tested Patient leans: N/A  Psychiatric Specialty Exam: Physical Exam  Constitutional: He appears well-developed. He appears lethargic. He has a sickly appearance.  HENT:  Head: Normocephalic and atraumatic.  Eyes: Conjunctivae are normal. Pupils are equal, round, and reactive to light.  Neck: Normal range of motion.  Cardiovascular: Normal heart sounds.   Respiratory: Effort normal.  GI: Soft.  Musculoskeletal: Normal range of motion.  Neurological: He appears lethargic.  Skin: Skin is warm and dry.  Psychiatric: Thought content normal. His affect is blunt. His speech is delayed. He is slowed. Cognition and memory are impaired. He exhibits abnormal recent memory and abnormal remote memory.    Review of Systems  Constitutional: Negative.   HENT: Negative.   Eyes: Negative.   Respiratory: Negative.   Cardiovascular: Negative.   Gastrointestinal: Negative.   Musculoskeletal: Negative.   Skin: Negative.   Neurological: Negative.   Psychiatric/Behavioral: Positive for memory loss. Negative for depression, suicidal ideas, hallucinations and substance abuse. The patient is not nervous/anxious and does not have insomnia.     Blood pressure 132/79, pulse 65,  temperature 98.3 F (36.8 C), temperature source Oral, resp. rate 20, height 6' 2" (1.88 m), weight 72.576 kg (160 lb), SpO2 98 %.Body mass index is 20.53 kg/(m^2).  General Appearance: Disheveled  Eye Sport and exercise psychologist::  Fair  Speech:  Blocked and Slow  Volume:  Decreased  Mood:  Euthymic  Affect:  Blunt  Thought Process:  Loose  Orientation:  Other:  Patient was able to tell me that he was in a hospital but had no idea as to the year or the month. Didn't know which hospital he was in.  Thought Content:  Negative  Suicidal Thoughts:  No  Homicidal Thoughts:  No  Memory:  Immediate;   Fair Recent;   Poor Remote;   Poor  Judgement:  Impaired  Insight:  Shallow  Psychomotor Activity:  Decreased  Concentration:  Poor  Recall:  Poor  Fund of Knowledge:Poor  Language: Poor  Akathisia:  No  Handed:  Right  AIMS (if indicated):     Assets:  Social Support  ADL's:  Impaired  Cognition: Impaired,  Severe  Sleep:      Medical Decision Making: Review of Psycho-Social Stressors (1), Review or order clinical lab tests (1), Established Problem, Worsening (2) and Review or order medicine tests (1)  Treatment Plan Summary: Plan This is a 69 year old man with multiple medical problems who is clearly demented. Doesn't know where he is doesn't know what year it is Remember 3 words even immediately without going over it several times. Presumably a combination of vascular and Alzheimer's disease contribution. Any agitation or dangerous behavior here in the emergency room. If he is wandering away from his group home that is normal behavior for a person with advanced dementia. There is no indication for inpatient hospitalization. No indication for any psychiatric medicine. Patient should be released from the emergency room and follow-up with his current caregiver. Involuntary commitment discontinued.  Plan:  Patient does not meet criteria for psychiatric inpatient admission. Disposition: Discontinue IVC.  Recommend discharge back to his living facility  Alethia Berthold 06/07/2015 3:29 PM

## 2015-06-07 NOTE — ED Notes (Signed)
BEHAVIORAL HEALTH ROUNDING Patient sleeping: Yes.   Patient alert and oriented: no Behavior appropriate: Yes.  ; If no, describe:  Nutrition and fluids offered: No Toileting and hygiene offered: No Sitter present: no Law enforcement present: Yes

## 2015-06-07 NOTE — Discharge Instructions (Signed)
Please seek medical attention and help for any thoughts about wanting to harm herself, harm others, any concerning change in behavior, severe depression, inappropriate drug use or any other new or concerning symptoms. Dementia Dementia is a general term for problems with brain function. A person with dementia has memory loss and a hard time with at least one other brain function such as thinking, speaking, or problem solving. Dementia can affect social functioning, how you do your job, your mood, or your personality. The changes may be hidden for a long time. The earliest forms of this disease are usually not detected by family or friends. Dementia can be:  Irreversible.  Potentially reversible.  Partially reversible.  Progressive. This means it can get worse over time. CAUSES  Irreversible dementia causes may include:  Degeneration of brain cells (Alzheimer disease or Lewy body dementia).  Multiple small strokes (vascular dementia).  Infection (chronic meningitis or Creutzfeldt-Jakob disease).  Frontotemporal dementia. This affects younger people, age 44 to 73, compared to those who have Alzheimer disease.  Dementia associated with other disorders like Parkinson disease, Huntington disease, or HIV-associated dementia. Potentially or partially reversible dementia causes may include:  Medicines.  Metabolic causes such as excessive alcohol intake, vitamin B12 deficiency, or thyroid disease.  Masses or pressure in the brain such as a tumor, blood clot, or hydrocephalus. SIGNS AND SYMPTOMS  Symptoms are often hard to detect. Family members or coworkers may not notice them early in the disease process. Different people with dementia may have different symptoms. Symptoms can include:  A hard time with memory, especially recent memory. Long-term memory may not be impaired.  Asking the same question multiple times or forgetting something someone just said.  A hard time speaking your  thoughts or finding certain words.  A hard time solving problems or performing familiar tasks (such as how to use a telephone).  Sudden changes in mood.  Changes in personality, especially increasing moodiness or mistrust.  Depression.  A hard time understanding complex ideas that were never a problem in the past. DIAGNOSIS  There are no specific tests for dementia.   Your health care provider may recommend a thorough evaluation. This is because some forms of dementia can be reversible. The evaluation will likely include a physical exam and getting a detailed history from you and a family member. The history often gives the best clues and suggestions for a diagnosis.  Memory testing may be done. A detailed brain function evaluation called neuropsychologic testing may be helpful.  Lab tests and brain imaging (such as a CT scan or MRI scan) are sometimes important.  Sometimes observation and re-evaluation over time is very helpful. TREATMENT  Treatment depends on the cause.   If the problem is a vitamin deficiency, it may be helped or cured with supplements.  For dementias such as Alzheimer disease, medicines are available to stabilize or slow the course of the disease. There are no cures for this type of dementia.  Your health care provider can help direct you to groups, organizations, and other health care providers to help with decisions in the care of you or your loved one. HOME CARE INSTRUCTIONS The care of individuals with dementia is varied and dependent upon the progression of the dementia. The following suggestions are intended for the person living with, or caring for, the person with dementia.  Create a safe environment.  Remove the locks on bathroom doors to prevent the person from accidentally locking himself or herself in.  Use  childproof latches on kitchen cabinets and any place where cleaning supplies, chemicals, or alcohol are kept.  Use childproof covers in  unused electrical outlets.  Install childproof devices to keep doors and windows secured.  Remove stove knobs or install safety knobs and an automatic shut-off on the stove.  Lower the temperature on water heaters.  Label medicines and keep them locked up.  Secure knives, lighters, matches, power tools, and guns, and keep these items out of reach.  Keep the house free from clutter. Remove rugs or anything that might contribute to a fall.  Remove objects that might break and hurt the person.  Make sure lighting is good, both inside and outside.  Install grab rails as needed.  Use a monitoring device to alert you to falls or other needs for help.  Reduce confusion.  Keep familiar objects and people around.  Use night lights or dim lights at night.  Label items or areas.  Use reminders, notes, or directions for daily activities or tasks.  Keep a simple, consistent routine for waking, meals, bathing, dressing, and bedtime.  Create a calm, quiet environment.  Place large clocks and calendars prominently.  Display emergency numbers and home address near all telephones.  Use cues to establish different times of the day. An example is to open curtains to let the natural light in during the day.   Use effective communication.  Choose simple words and short sentences.  Use a gentle, calm tone of voice.  Be careful not to interrupt.  If the person is struggling to find a word or communicate a thought, try to provide the word or thought.  Ask one question at a time. Allow the person ample time to answer questions. Repeat the question again if the person does not respond.  Reduce nighttime restlessness.  Provide a comfortable bed.  Have a consistent nighttime routine.  Ensure a regular walking or physical activity schedule. Involve the person in daily activities as much as possible.  Limit napping during the day.  Limit caffeine.  Attend social events that  stimulate rather than overwhelm the senses.  Encourage good nutrition and hydration.  Reduce distractions during meal times and snacks.  Avoid foods that are too hot or too cold.  Monitor chewing and swallowing ability.  Continue with routine vision, hearing, dental, and medical screenings.  Give medicines only as directed by the health care provider.  Monitor driving abilities. Do not allow the person to drive when safe driving is no longer possible.  Register with an identification program which could provide location assistance in the event of a missing person situation. SEEK MEDICAL CARE IF:   New behavioral problems start such as moodiness, aggressiveness, or seeing things that are not there (hallucinations).  Any new problem with brain function happens. This includes problems with balance, speech, or falling a lot.  Problems with swallowing develop.  Any symptoms of other illness happen. Small changes or worsening in any aspect of brain function can be a sign that the illness is getting worse. It can also be a sign of another medical illness such as infection. Seeing a health care provider right away is important. SEEK IMMEDIATE MEDICAL CARE IF:   A fever develops.  New or worsened confusion develops.  New or worsened sleepiness develops.  Staying awake becomes hard to do. Document Released: 05/26/2001 Document Revised: 04/16/2014 Document Reviewed: 04/27/2011 [facility] Patient Information 2015 [place], [place]. This information is not intended to replace advice given to you by  your health care provider. Make sure you discuss any questions you have with your health care provider. ° °

## 2015-06-07 NOTE — Progress Notes (Signed)
Call to patient's sister Kyle Short, she will come pick up patient and transport him back to the group once she picks up her car from the shop today.  CSW will provide information on  Elder care and dementia support Services.   RN informed that family has been contacted.   CSW received return call from patient's daughter, will send family information and resources if they are interested in placement of patient at dementia facility.   Casimer Lanius. Latanya Presser, MSW Clinical Social Work Department Emergency Room [phone] 3:55 PM

## 2015-06-07 NOTE — ED Notes (Signed)
BEHAVIORAL HEALTH ROUNDING Patient sleeping: Yes.   Patient alert and oriented: yes Behavior appropriate: Yes.  ; If no, describe:  Nutrition and fluids offered: No Toileting and hygiene offered: Yes  Sitter present: yes Law enforcement present: Yes

## 2015-06-07 NOTE — ED Notes (Signed)
BEHAVIORAL HEALTH ROUNDING Patient sleeping: Yes.   Patient alert and oriented: not applicable Behavior appropriate: Yes.   Nutrition and fluids offered: No, pt sleeping Toileting and hygiene offered: No, pt sleeping Sitter present: yes Law enforcement present: Yes   ENVIRONMENTAL ASSESSMENT Potentially harmful objects out of patient reach: Yes.   Personal belongings secured: Yes.   Patient dressed in hospital provided attire only: Yes.   Plastic bags out of patient reach: Yes.   Patient care equipment (cords, cables, call bells, lines, and drains) shortened, removed, or accounted for: Yes.   Equipment and supplies removed from bottom of stretcher: Yes.   Potentially toxic materials out of patient reach: Yes.   Sharps container removed or out of patient reach: Yes.

## 2015-06-07 NOTE — ED Notes (Signed)
BEHAVIORAL HEALTH ROUNDING  Patient sleeping: Yes.  Patient alert and oriented: no  Behavior appropriate: Yes. ; If no, describe:  Nutrition and fluids offered: No  Toileting and hygiene offered: No  Sitter present: no  Law enforcement present: Yes

## 2015-06-07 NOTE — ED Notes (Signed)
BEHAVIORAL HEALTH ROUNDING Patient sleeping: No. Patient alert and oriented: yes Behavior appropriate: Yes.  ; If no, describe:  Nutrition and fluids offered: Yes  Toileting and hygiene offered: No Sitter present: yes Law enforcement present: Yes

## 2015-06-07 NOTE — ED Provider Notes (Signed)
-----------------------------------------   3:28 PM on 06/07/2015 -----------------------------------------  Psychiatry has seen the patient. They have rescinded the IVC. They believe patient is safe to be discharged from the emergency department.  Nance Pear, MD 06/07/15 [phone]

## 2015-06-07 NOTE — ED Notes (Signed)
BEHAVIORAL HEALTH ROUNDING Patient sleeping: No. Patient alert and oriented: yes Behavior appropriate: Yes.  ; If no, describe:  Nutrition and fluids offered: No Toileting and hygiene offered: No Sitter present: yes Law enforcement present: Yes

## 2015-06-07 NOTE — ED Notes (Signed)
Contact info for pt: Daughter: Becky Sax [phone] (in [place]) Sister: Orbie Hurst, [phone] (lives behind [facility], home for pt, on [address])  message left for [place] at [phone] to pickup pt

## 2015-07-06 ENCOUNTER — Encounter: Payer: Self-pay | Admitting: *Deleted

## 2015-07-06 ENCOUNTER — Inpatient Hospital Stay
Admission: EM | Admit: 2015-07-06 | Discharge: 2015-07-09 | DRG: 871 | Disposition: A | Discharge status: Home / Self Care | Payer: Medicare Other | Attending: Specialist | Admitting: Specialist

## 2015-07-06 ENCOUNTER — Emergency Department: Payer: Medicare Other

## 2015-07-06 DIAGNOSIS — I5022 Chronic systolic (congestive) heart failure: Secondary | ICD-10-CM | POA: Diagnosis present

## 2015-07-06 DIAGNOSIS — Z8249 Family history of ischemic heart disease and other diseases of the circulatory system: Secondary | ICD-10-CM

## 2015-07-06 DIAGNOSIS — F17210 Nicotine dependence, cigarettes, uncomplicated: Secondary | ICD-10-CM | POA: Diagnosis present

## 2015-07-06 DIAGNOSIS — M79602 Pain in left arm: Secondary | ICD-10-CM | POA: Diagnosis not present

## 2015-07-06 DIAGNOSIS — Z716 Tobacco abuse counseling: Secondary | ICD-10-CM | POA: Diagnosis not present

## 2015-07-06 DIAGNOSIS — N183 Chronic kidney disease, stage 3 (moderate): Secondary | ICD-10-CM | POA: Diagnosis present

## 2015-07-06 DIAGNOSIS — I9589 Other hypotension: Secondary | ICD-10-CM

## 2015-07-06 DIAGNOSIS — Z7982 Long term (current) use of aspirin: Secondary | ICD-10-CM

## 2015-07-06 DIAGNOSIS — G9341 Metabolic encephalopathy: Secondary | ICD-10-CM | POA: Diagnosis present

## 2015-07-06 DIAGNOSIS — G311 Senile degeneration of brain, not elsewhere classified: Secondary | ICD-10-CM | POA: Diagnosis not present

## 2015-07-06 DIAGNOSIS — R4182 Altered mental status, unspecified: Secondary | ICD-10-CM | POA: Diagnosis not present

## 2015-07-06 DIAGNOSIS — E875 Hyperkalemia: Secondary | ICD-10-CM | POA: Diagnosis present

## 2015-07-06 DIAGNOSIS — R531 Weakness: Secondary | ICD-10-CM | POA: Diagnosis not present

## 2015-07-06 DIAGNOSIS — E860 Dehydration: Secondary | ICD-10-CM | POA: Diagnosis present

## 2015-07-06 DIAGNOSIS — F0390 Unspecified dementia without behavioral disturbance: Secondary | ICD-10-CM | POA: Diagnosis present

## 2015-07-06 DIAGNOSIS — Z79899 Other long term (current) drug therapy: Secondary | ICD-10-CM

## 2015-07-06 DIAGNOSIS — A419 Sepsis, unspecified organism: Secondary | ICD-10-CM | POA: Diagnosis present

## 2015-07-06 DIAGNOSIS — E785 Hyperlipidemia, unspecified: Secondary | ICD-10-CM | POA: Diagnosis present

## 2015-07-06 DIAGNOSIS — I248 Other forms of acute ischemic heart disease: Secondary | ICD-10-CM | POA: Diagnosis present

## 2015-07-06 DIAGNOSIS — N179 Acute kidney failure, unspecified: Secondary | ICD-10-CM

## 2015-07-06 DIAGNOSIS — E872 Acidosis: Secondary | ICD-10-CM | POA: Diagnosis present

## 2015-07-06 DIAGNOSIS — N19 Unspecified kidney failure: Secondary | ICD-10-CM

## 2015-07-06 DIAGNOSIS — Z720 Tobacco use: Secondary | ICD-10-CM | POA: Diagnosis not present

## 2015-07-06 DIAGNOSIS — I129 Hypertensive chronic kidney disease with stage 1 through stage 4 chronic kidney disease, or unspecified chronic kidney disease: Secondary | ICD-10-CM | POA: Diagnosis present

## 2015-07-06 DIAGNOSIS — N3000 Acute cystitis without hematuria: Secondary | ICD-10-CM | POA: Diagnosis not present

## 2015-07-06 DIAGNOSIS — N390 Urinary tract infection, site not specified: Secondary | ICD-10-CM | POA: Diagnosis present

## 2015-07-06 DIAGNOSIS — B9620 Unspecified Escherichia coli [E. coli] as the cause of diseases classified elsewhere: Secondary | ICD-10-CM | POA: Diagnosis present

## 2015-07-06 DIAGNOSIS — M6281 Muscle weakness (generalized): Secondary | ICD-10-CM | POA: Diagnosis not present

## 2015-07-06 DIAGNOSIS — N189 Chronic kidney disease, unspecified: Secondary | ICD-10-CM

## 2015-07-06 DIAGNOSIS — F17200 Nicotine dependence, unspecified, uncomplicated: Secondary | ICD-10-CM | POA: Diagnosis not present

## 2015-07-06 HISTORY — DX: Hyperlipidemia, unspecified: E78.5

## 2015-07-06 LAB — CBC WITH DIFFERENTIAL/PLATELET
BASOS ABS: 0.2 10*3/uL — AB (ref 0–0.1)
BASOS PCT: 2 %
EOS ABS: 0 10*3/uL (ref 0–0.7)
Eosinophils Relative: 0 %
HEMATOCRIT: 33.3 % — AB (ref 40.0–52.0)
HEMOGLOBIN: 10.6 g/dL — AB (ref 13.0–18.0)
LYMPHS ABS: 0.4 10*3/uL — AB (ref 1.0–3.6)
Lymphocytes Relative: 4 %
MCH: 28 pg (ref 26.0–34.0)
MCHC: 31.8 g/dL — ABNORMAL LOW (ref 32.0–36.0)
MCV: 88 fL (ref 80.0–100.0)
MONOS PCT: 3 %
Monocytes Absolute: 0.3 10*3/uL (ref 0.2–1.0)
Neutro Abs: 8.5 10*3/uL — ABNORMAL HIGH (ref 1.4–6.5)
Neutrophils Relative %: 91 %
Platelets: 179 10*3/uL (ref 150–440)
RBC: 3.79 MIL/uL — AB (ref 4.40–5.90)
RDW: 14.3 % (ref 11.5–14.5)
WBC: 9.3 10*3/uL (ref 3.8–10.6)

## 2015-07-06 LAB — COMPREHENSIVE METABOLIC PANEL
ALBUMIN: 3.5 g/dL (ref 3.5–5.0)
ALT: 63 U/L (ref 17–63)
AST: 119 U/L — AB (ref 15–41)
Alkaline Phosphatase: 96 U/L (ref 38–126)
Anion gap: 12 (ref 5–15)
BUN: 53 mg/dL — ABNORMAL HIGH (ref 6–20)
CHLORIDE: 109 mmol/L (ref 101–111)
CO2: 17 mmol/L — ABNORMAL LOW (ref 22–32)
CREATININE: 4.93 mg/dL — AB (ref 0.61–1.24)
Calcium: 9.3 mg/dL (ref 8.9–10.3)
GFR calc Af Amer: 13 mL/min — ABNORMAL LOW (ref >60)
GFR calc non Af Amer: 11 mL/min — ABNORMAL LOW (ref >60)
Glucose, Bld: 106 mg/dL — ABNORMAL HIGH (ref 65–99)
Potassium: 5.8 mmol/L — ABNORMAL HIGH (ref 3.5–5.1)
Sodium: 138 mmol/L (ref 135–145)
Total Bilirubin: 0.4 mg/dL (ref 0.3–1.2)
Total Protein: 8 g/dL (ref 6.5–8.1)

## 2015-07-06 LAB — URINALYSIS COMPLETE WITH MICROSCOPIC (ARMC ONLY)
Bilirubin Urine: NEGATIVE
Glucose, UA: NEGATIVE mg/dL
Ketones, ur: NEGATIVE mg/dL
Nitrite: NEGATIVE
PH: 5 (ref 5.0–8.0)
Protein, ur: 30 mg/dL — AB
Specific Gravity, Urine: 1.015 (ref 1.005–1.030)

## 2015-07-06 LAB — LACTIC ACID, PLASMA
Lactic Acid, Venous: 4.2 mmol/L (ref 0.5–2.0)
Lactic Acid, Venous: 3.8 mmol/L (ref 0.5–2.0)
Lactic Acid, Venous: 3.7 mmol/L (ref 0.5–2.0)

## 2015-07-06 LAB — TROPONIN I: Troponin I: 0.09 ng/mL — ABNORMAL HIGH (ref <0.031)

## 2015-07-06 LAB — MRSA PCR SCREENING: MRSA by PCR: NEGATIVE

## 2015-07-06 MED ORDER — PNEUMOCOCCAL VAC POLYVALENT 25 MCG/0.5ML IJ INJ
0.5000 mL | INJECTION | INTRAMUSCULAR | Status: AC
  Administered 2015-07-07: 0.5 mL via INTRAMUSCULAR
  Filled 2015-07-06: qty 0.5

## 2015-07-06 MED ORDER — ASPIRIN 81 MG PO CHEW
81.0000 mg | CHEWABLE_TABLET | Freq: Every day | ORAL | Status: DC
  Administered 2015-07-06 – 2015-07-09 (×4): 81 mg via ORAL
  Filled 2015-07-06 (×4): qty 1

## 2015-07-06 MED ORDER — DEXTROSE 5 % IV SOLN
1.0000 g | INTRAVENOUS | Status: DC
  Administered 2015-07-06 – 2015-07-07 (×2): 1 g via INTRAVENOUS
  Filled 2015-07-06 (×4): qty 10

## 2015-07-06 MED ORDER — SODIUM CHLORIDE 0.9 % IV SOLN
INTRAVENOUS | Status: DC
  Administered 2015-07-06 – 2015-07-08 (×3): via INTRAVENOUS

## 2015-07-06 MED ORDER — ATORVASTATIN CALCIUM 10 MG PO TABS
10.0000 mg | ORAL_TABLET | Freq: Every day | ORAL | Status: DC
  Administered 2015-07-06 – 2015-07-08 (×3): 10 mg via ORAL
  Filled 2015-07-06 (×3): qty 1

## 2015-07-06 MED ORDER — SODIUM POLYSTYRENE SULFONATE 15 GM/60ML PO SUSP
30.0000 g | Freq: Once | ORAL | Status: AC
  Administered 2015-07-06: 30 g via ORAL
  Filled 2015-07-06: qty 120

## 2015-07-06 MED ORDER — HEPARIN SODIUM (PORCINE) 5000 UNIT/ML IJ SOLN
5000.0000 [IU] | Freq: Three times a day (TID) | INTRAMUSCULAR | Status: DC
  Administered 2015-07-06 – 2015-07-09 (×9): 5000 [IU] via SUBCUTANEOUS
  Filled 2015-07-06 (×8): qty 1

## 2015-07-06 NOTE — H&P (Signed)
[place] [facility] [place] NAME: Kyle Short    MR#:  [id number]  DATE OF BIRTH:  01-16-1946  DATE OF ADMISSION:  07/06/2015  PRIMARY CARE PHYSICIAN: No PCP Per Patient   REQUESTING/REFERRING PHYSICIAN: Conni Slipper  CHIEF COMPLAINT:   Chief Complaint  Patient presents with  . Weakness    HISTORY OF PRESENT ILLNESS: Kyle Short  is a 69 y.o. male with a known history of hypertension , CHF ejection fraction 20% per previous records, hyperlipidemia, lives in a group home- brought to emergency room today with altered mental status, and complaining of dizziness.  In ER noted to have borderline low blood pressure, elevated lactic acid, and positive urinalysis.  PAST MEDICAL HISTORY:   Past Medical History  Diagnosis Date  . Hypertension   . Lives in group home   . CHF (congestive heart failure)   . Hyperlipidemia     PAST SURGICAL HISTORY: History reviewed. No pertinent past surgical history.  SOCIAL HISTORY:  History  Substance Use Topics  . Smoking status: Current Every Day Smoker -- 1.00 packs/day    Types: Cigarettes  . Smokeless tobacco: Not on file  . Alcohol Use: No    FAMILY HISTORY:  Family History  Problem Relation Age of Onset  . Hypertension Other     DRUG ALLERGIES: No Known Allergies  REVIEW OF SYSTEMS:   Pt is confused and not able to give me details.  MEDICATIONS AT HOME:  Prior to Admission medications   Medication Sig Start Date End Date Taking? Authorizing Provider  aspirin 81 MG chewable tablet Chew 81 mg by mouth daily.   Yes Historical Provider, MD  atorvastatin (LIPITOR) 10 MG tablet Take 10 mg by mouth daily.   Yes Historical Provider, MD  bismuth subsalicylate (PEPTO BISMOL) 262 MG chewable tablet Chew 262 mg by mouth daily as needed for indigestion or diarrhea or loose stools.   Yes Historical Provider, MD  carvedilol (COREG) 12.5 MG tablet Take 12.5 mg by mouth 2 (two) times daily.    Yes Historical Provider, MD  Cholecalciferol (HM VITAMIN D3) 2000 UNITS CAPS Take 2,000 Units by mouth daily.   Yes Historical Provider, MD  hydrALAZINE (APRESOLINE) 25 MG tablet Take 25 mg by mouth 4 (four) times daily.   Yes Historical Provider, MD  isosorbide mononitrate (IMDUR) 30 MG 24 hr tablet Take 30 mg by mouth daily.   Yes Historical Provider, MD  lisinopril (PRINIVIL,ZESTRIL) 20 MG tablet Take 20 mg by mouth daily.   Yes Historical Provider, MD  Melatonin (RA MELATONIN) 10 MG TABS Take 10 mg by mouth at bedtime.   Yes Historical Provider, MD      PHYSICAL EXAMINATION:   VITAL SIGNS: Blood pressure 104/74, pulse 72, temperature 98.1 F (36.7 C), temperature source Oral, resp. rate 23, height 6\' 1"  (1.854 m), weight 71.215 kg (157 lb), SpO2 95 %.  GENERAL:  69 y.o.-year-old patient lying in the bed with no acute distress. dishawelled. EYES: Pupils equal, round, reactive to light and accommodation. No scleral icterus. Extraocular muscles intact.  HEENT: Head atraumatic, normocephalic. Oropharynx and nasopharynx clear. Very poor oral hyegine, some discharge/ wax from left ear. NECK:  Supple, no jugular venous distention. No thyroid enlargement, no tenderness.  LUNGS: Normal breath sounds bilaterally, no wheezing, rales,rhonchi or crepitation. No use of accessory muscles of respiration.  CARDIOVASCULAR: S1, S2 normal. No murmurs, rubs, or gallops.  ABDOMEN: Soft, nontender, nondistended. Bowel sounds present. No organomegaly or  mass.  EXTREMITIES: No pedal edema, cyanosis, or clubbing. Cold lower extrimities, distal pulses present. NEUROLOGIC: Cranial nerves II through XII are intact. Muscle strength 3-4/5 in all extremities. Sensation intact. Gait not checked.  PSYCHIATRIC: The patient is alert not well oriented.  SKIN: No obvious rash, lesion, or ulcer.   LABORATORY PANEL:   CBC  Recent Labs Lab 07/06/15 1023  WBC 9.3  HGB 10.6*  HCT 33.3*  PLT 179  MCV 88.0  MCH 28.0   MCHC 31.8*  RDW 14.3  LYMPHSABS 0.4*  MONOABS 0.3  EOSABS 0.0  BASOSABS 0.2*   ------------------------------------------------------------------------------------------------------------------  Chemistries   Recent Labs Lab 07/06/15 1023  NA 138  K 5.8*  CL 109  CO2 17*  GLUCOSE 106*  BUN 53*  CREATININE 4.93*  CALCIUM 9.3  AST 119*  ALT 63  ALKPHOS 96  BILITOT 0.4   ------------------------------------------------------------------------------------------------------------------ estimated creatinine clearance is 14.6 mL/min (by C-G formula based on Cr of 4.93). ------------------------------------------------------------------------------------------------------------------ No results for input(s): TSH, T4TOTAL, T3FREE, THYROIDAB in the last 72 hours.  Invalid input(s): FREET3   Cardiac Enzymes  Recent Labs Lab 07/06/15 1023  TROPONINI 0.09*   ------------------------------------------------------------------------------------------------------------------ Invalid input(s): POCBNP  ---------------------------------------------------------------------------------------------------------------  Urinalysis    Component Value Date/Time   COLORURINE AMBER* 07/06/2015 1023   COLORURINE Yellow 01/17/2015 1306   APPEARANCEUR CLOUDY* 07/06/2015 1023   APPEARANCEUR Clear 01/17/2015 1306   LABSPEC 1.015 07/06/2015 1023   LABSPEC 1.017 01/17/2015 1306   PHURINE 5.0 07/06/2015 1023   PHURINE 5.0 01/17/2015 1306   GLUCOSEU NEGATIVE 07/06/2015 1023   GLUCOSEU Negative 01/17/2015 1306   HGBUR 1+* 07/06/2015 1023   HGBUR Negative 01/17/2015 1306   BILIRUBINUR NEGATIVE 07/06/2015 1023   BILIRUBINUR Negative 01/17/2015 1306   KETONESUR NEGATIVE 07/06/2015 1023   KETONESUR Negative 01/17/2015 1306   PROTEINUR 30* 07/06/2015 1023   PROTEINUR Negative 01/17/2015 1306   UROBILINOGEN 0.2 04/30/2011 2010   NITRITE NEGATIVE 07/06/2015 1023   NITRITE Negative 01/17/2015  1306   LEUKOCYTESUR 2+* 07/06/2015 1023   LEUKOCYTESUR Negative 01/17/2015 1306     RADIOLOGY: Ct Head Wo Contrast  07/06/2015   CLINICAL DATA:  Increasing weakness beginning this morning. Left arm pain. Initial encounter.  EXAM: CT HEAD WITHOUT CONTRAST  TECHNIQUE: Contiguous axial images were obtained from the base of the skull through the vertex without intravenous contrast.  COMPARISON:  Head CT scan 01/17/2015 and 12/12/2014.  FINDINGS: Atrophy and extensive chronic microvascular ischemic change are again seen. 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 Small, remote infarctions are also identified in the cerebellar hemispheres. No evidence of acute abnormality including infarct, hemorrhage, mass lesion, mass effect, midline shift or abnormal extra-axial fluid collection. No hydrocephalus or pneumocephalus. The calvarium is intact. Atherosclerosis is noted. Very mild mucosal thickening right sphenoid sinus is noted.  IMPRESSION: No acute abnormality.  Atrophy, chronic microvascular ischemic change and remote small bilateral infarcts.   Electronically Signed   By: Inge Rise M.D.   On: 07/06/2015 10:56   Dg Chest Portable 1 View  07/06/2015   CLINICAL DATA:  Weakness beginning this morning. Left arm pain. Initial encounter.  EXAM: PORTABLE CHEST - 1 VIEW  COMPARISON:  Single view of the chest 01/17/2015.  FINDINGS: The lungs are clear. Heart size is mildly enlarged. No pneumothorax or pleural effusion.  IMPRESSION: No acute disease.   Electronically Signed   By: Inge Rise M.D.   On: 07/06/2015 10:43    IMPRESSION AND PLAN:  *  Sepsis due to UTI  Evident by altered mental status, hypotension and lactic acidosis.  We will give IV fluid for now, Rocephin for UTI.  Recheck lactic acid for correction.  Sepsis workup is already sent by ER, CT head negative.  * Acute on chronic renal failure  Secondary to dehydration  Give IV fluid and monitor.   *  Hyperkalemia  Kayexalate 1 dose.  * Congestive heart failure, chronic systolic  No signs of acute worsening, will monitor as he will be on IV fluids.  * Hyperlipidemia  Continue statin  * Smoking  Counseled to quit for 4 minutes, offered nicotine patch.  All the records are reviewed and case discussed with ED provider. Management plans discussed with the patient, family and they are in agreement.  CODE STATUS: full    TOTAL TIME TAKING CARE OF THIS PATIENT: 50 minutes.  More than 50% of the visit was spent in counseling/coordination of care: Ilean Skill M.D on 07/06/2015   Between 7am to 6pm - Pager - [phone]  After 6pm go to [email or web address] - password [email or web address] [facility]  [facility]  Office  [phone]  CC: Primary care physician; No PCP Per Patient

## 2015-07-06 NOTE — ED Notes (Signed)
Patient transported to CT 

## 2015-07-06 NOTE — Progress Notes (Signed)
Dr. Anselm Jungling paged about patient's diet order. Informed MD that patient was able to swallow water and kayexalate with no problems and seems to be more alert. MD stated okay to give patient a heart healthy diet at this time. Also questioned normal saline order due to patient's CHF, MD stated patient needs fluids due to renal function.

## 2015-07-06 NOTE — ED Provider Notes (Signed)
[facility] Emergency Department Provider Note  ____________________________________________  Time seen: Approximately 11:37 AM  I have reviewed the triage vital signs and the nursing notes.   HISTORY  Chief Complaint Weakness  History limited by a patient's dementia  HPI Kyle Short is a 69 y.o. male sent from family care home for decreased responsiveness and increased weakness today. EMS reports fingerstick was within normal limits. 101 specifically. Patient is afebrile. Patient reports he developed a cough today. Patient was hypotensive with EMS down into the 70s he got a fluid bolus 100 cc I believe they said. Patient's blood pressure went up to 96. Patient is to receive another fluid bolus. Patient denies any headache chest pain belly pain or any other problems. Does not say he feels bad when asked. But per the family care home is weaker than usual and less responsive than usual.  Past Medical History  Diagnosis Date  . Hypertension   . Lives in group home     Patient Active Problem List   Diagnosis Date Noted  . Dementia, Alzheimer's, with behavior disturbance 06/07/2015  . Congestive heart failure 06/07/2015  . Renal insufficiency 06/07/2015    History reviewed. No pertinent past surgical history.  No current outpatient prescriptions on file.  Allergies Review of patient's allergies indicates no known allergies.  History reviewed. No pertinent family history.  Social History History  Substance Use Topics  . Smoking status: Current Every Day Smoker -- 1.00 packs/day    Types: Cigarettes  . Smokeless tobacco: Not on file  . Alcohol Use: No    Review of Systems Constitutional: No fever/chills Eyes: No visual changes. ENT: No sore throat. Cardiovascular: Denies chest pain. Respiratory: Denies shortness of breath. Gastrointestinal: No abdominal pain.  No nausea, no vomiting.  No diarrhea.  No constipation. Genitourinary: Negative  for dysuria. Musculoskeletal: Negative for back pain. Skin: Negative for rash.  10-point ROS otherwise negative.  ____________________________________________   PHYSICAL EXAM:  VITAL SIGNS: ED Triage Vitals  Enc Vitals Group     BP 07/06/15 1107 103/63 mmHg     Pulse Rate 07/06/15 1107 73     Resp --      Temp 07/06/15 1011 98.1 F (36.7 C)     Temp Source 07/06/15 1011 Oral     SpO2 07/06/15 1107 96 %     Weight 07/06/15 1011 157 lb (71.215 kg)     Height 07/06/15 1011 6\' 1"  (1.854 m)     Head Cir --      Peak Flow --      Pain Score 07/06/15 1016 8     Pain Loc --      Pain Edu? --      Excl. in [place]? --     Constitutional: Patient looks tired is easily arousable and responds appropriately Eyes: Conjunctivae are normal. PERRL. EOMI. Head: Atraumatic. Nose: No congestion/rhinnorhea. Mouth/Throat: Mucous membranes are moist.  Oropharynx non-erythematous. Neck: No stridor.  Supple Cardiovascular: Normal rate, regular rhythm. Grossly normal heart sounds.  Good peripheral circulation. Respiratory: Normal respiratory effort.  No retractions. Lungs CTAB. Gastrointestinal: Soft and nontender. No distention. No abdominal bruits. No CVA tenderness. Musculoskeletal: No lower extremity tenderness nor edema.  No joint effusions. Neurologic:  Normal speech and language. No gross focal neurologic deficits are appreciated. No gait instability. Skin:  Skin is warm, dry and intact. No rash noted.   ____________________________________________   LABS (all labs ordered are listed, but only abnormal results are displayed)  Labs Reviewed  COMPREHENSIVE METABOLIC PANEL - Abnormal; Notable for the following:    Potassium 5.8 (*)    CO2 17 (*)    Glucose, Bld 106 (*)    BUN 53 (*)    Creatinine, Ser 4.93 (*)    AST 119 (*)    GFR calc non Af Amer 11 (*)    GFR calc Af Amer 13 (*)    All other components within normal limits  TROPONIN I - Abnormal; Notable for the following:     Troponin I 0.09 (*)    All other components within normal limits  LACTIC ACID, PLASMA - Abnormal; Notable for the following:    Lactic Acid, Venous 4.2 (*)    All other components within normal limits  CBC WITH DIFFERENTIAL/PLATELET - Abnormal; Notable for the following:    RBC 3.79 (*)    Hemoglobin 10.6 (*)    HCT 33.3 (*)    MCHC 31.8 (*)    Neutro Abs 8.5 (*)    Lymphs Abs 0.4 (*)    Basophils Absolute 0.2 (*)    All other components within normal limits  URINE CULTURE  CULTURE, BLOOD (ROUTINE X 2)  CULTURE, BLOOD (ROUTINE X 2)  LACTIC ACID, PLASMA  URINALYSIS COMPLETEWITH MICROSCOPIC ([facility] ONLY)   ____________________________________________  EKG  EKG read and interpreted by me shows normal sinus rhythm at a rate of 73 normal axis some peaked T waves in V4 otherwise no acute changes ____________________________________________  RADIOLOGY  Chest x-ray was read as normal by radiology I agree with this I looked at the x-ray and CT of the head shows no acute findings by the radiologist ____________________________________________   PROCEDURES    ____________________________________________   INITIAL IMPRESSION / ASSESSMENT AND PLAN / ED COURSE  Pertinent labs & imaging results that were available during my care of the patient were reviewed by me and considered in my medical decision making (see chart for details).   ____________________________________________   FINAL CLINICAL IMPRESSION(S) / ED DIAGNOSES  Final diagnoses:  Renal failure  Other specified hypotension      Nena Polio, MD 07/06/15 1143

## 2015-07-06 NOTE — Progress Notes (Addendum)
Patient admitted today from [facility] group home, per sister Mabel. Patient is alert and oriented, but is forgetful at times. Has become more alert throughout the shift, was somewhat drowsy upon arrival from ED. Audible ronchi in lungs. Patient is still on NS at 5, confirmed with MD. Sinus rhythm with 1st degree heart block on tele. No complaints of pain. Potassium 5.8, patient has received kayexalate and has had two large loose stools. Sister Mabel and patient report that he is normally pretty independent, patient is very weak currently and unable to move well in the bed. Will continue to monitor.

## 2015-07-06 NOTE — ED Notes (Signed)
Pt arrives via EMS from group care home, caregiver states pt has increased weakness this AM and complaining of left arm pain, pt awake upon arrival in ni distress, pt answering MD questions

## 2015-07-07 DIAGNOSIS — A419 Sepsis, unspecified organism: Secondary | ICD-10-CM | POA: Diagnosis not present

## 2015-07-07 LAB — CBC
HCT: 31.4 % — ABNORMAL LOW (ref 40.0–52.0)
Hemoglobin: 10 g/dL — ABNORMAL LOW (ref 13.0–18.0)
MCH: 28.1 pg (ref 26.0–34.0)
MCHC: 31.9 g/dL — ABNORMAL LOW (ref 32.0–36.0)
MCV: 88.1 fL (ref 80.0–100.0)
Platelets: 177 10*3/uL (ref 150–440)
RBC: 3.57 MIL/uL — ABNORMAL LOW (ref 4.40–5.90)
RDW: 14.2 % (ref 11.5–14.5)
WBC: 21.7 10*3/uL — ABNORMAL HIGH (ref 3.8–10.6)

## 2015-07-07 LAB — BASIC METABOLIC PANEL
ANION GAP: 8 (ref 5–15)
BUN: 61 mg/dL — ABNORMAL HIGH (ref 6–20)
CALCIUM: 8.5 mg/dL — AB (ref 8.9–10.3)
CO2: 21 mmol/L — ABNORMAL LOW (ref 22–32)
CREATININE: 4.36 mg/dL — AB (ref 0.61–1.24)
Chloride: 112 mmol/L — ABNORMAL HIGH (ref 101–111)
GFR calc non Af Amer: 13 mL/min — ABNORMAL LOW (ref >60)
GFR, EST AFRICAN AMERICAN: 15 mL/min — AB (ref >60)
Glucose, Bld: 96 mg/dL (ref 65–99)
Potassium: 4.2 mmol/L (ref 3.5–5.1)
Sodium: 141 mmol/L (ref 135–145)

## 2015-07-07 NOTE — Progress Notes (Signed)
Notified Dr. Reece Levy of wet sounding cough.. Fluids decreased from 75 to 60.

## 2015-07-07 NOTE — Progress Notes (Signed)
[place] at [place] NAME: Kyle Short    MR#:  [id number]  DATE OF BIRTH:  05/21/1946  SUBJECTIVE:  CHIEF COMPLAINT:   Chief Complaint  Patient presents with  . Weakness   Patient here due to generalized weakness but noted to have hypotension with elevated lactic acid and suspected to have sepsis secondary to UTI. Also noted to be in acute on chronic renal failure. Presently denies any acute complaints  REVIEW OF SYSTEMS:    Review of Systems  Constitutional: Negative for fever and chills.  HENT: Negative for congestion and tinnitus.   Eyes: Negative for blurred vision and double vision.  Respiratory: Negative for cough, shortness of breath and wheezing.   Cardiovascular: Negative for chest pain, orthopnea and PND.  Gastrointestinal: Negative for nausea, vomiting, abdominal pain and diarrhea.  Genitourinary: Negative for dysuria and hematuria.  Neurological: Negative for dizziness, sensory change and focal weakness.  All other systems reviewed and are negative.  Nutrition: Heart healthy Tolerating Diet: Yes Tolerating PT: Await evaluation     DRUG ALLERGIES:  No Known Allergies  VITALS:  Blood pressure 137/87, pulse 82, temperature 97.5 F (36.4 C), temperature source Oral, resp. rate 18, height 6\' 1"  (1.854 m), weight 62.778 kg (138 lb 6.4 oz), SpO2 98 %.  PHYSICAL EXAMINATION:   Physical Exam  GENERAL:  69 y.o.-year-old patient lying in the bed with no acute distress.  EYES: Pupils equal, round, reactive to light and accommodation. No scleral icterus. Extraocular muscles intact.  HEENT: Head atraumatic, normocephalic. Oropharynx and nasopharynx clear.  NECK:  Supple, no jugular venous distention. No thyroid enlargement, no tenderness.  LUNGS: Poor respiratory effort.  Normal breath sounds bilaterally, no wheezing, rales, rhonchi. No use of accessory muscles of respiration.  CARDIOVASCULAR: S1, S2 normal. No  Murmurs, rubs, or gallops.  ABDOMEN: Soft, nontender, nondistended. Bowel sounds present. No organomegaly or mass.  EXTREMITIES: No cyanosis, clubbing or edema b/l.    NEUROLOGIC: Cranial nerves II through XII are intact. No focal Motor or sensory deficits b/l.   PSYCHIATRIC: The patient is alert and oriented x 3. Flat affect SKIN: No obvious rash, lesion, or ulcer.    LABORATORY PANEL:   CBC  Recent Labs Lab 07/07/15 0511  WBC 21.7*  HGB 10.0*  HCT 31.4*  PLT 177   ------------------------------------------------------------------------------------------------------------------  Chemistries   Recent Labs Lab 07/06/15 1023 07/07/15 0511  NA 138 141  K 5.8* 4.2  CL 109 112*  CO2 17* 21*  GLUCOSE 106* 96  BUN 53* 61*  CREATININE 4.93* 4.36*  CALCIUM 9.3 8.5*  AST 119*  --   ALT 63  --   ALKPHOS 96  --   BILITOT 0.4  --    ------------------------------------------------------------------------------------------------------------------  Cardiac Enzymes  Recent Labs Lab 07/06/15 1023  TROPONINI 0.09*   ------------------------------------------------------------------------------------------------------------------  RADIOLOGY:  Ct Head Wo Contrast  07/06/2015   CLINICAL DATA:  Increasing weakness beginning this morning. Left arm pain. Initial encounter.  EXAM: CT HEAD WITHOUT CONTRAST  TECHNIQUE: Contiguous axial images were obtained from the base of the skull through the vertex without intravenous contrast.  COMPARISON:  Head CT scan 01/17/2015 and 12/12/2014.  FINDINGS: Atrophy and extensive chronic microvascular ischemic change are again seen. 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 Small, remote infarctions are also identified in the cerebellar hemispheres. No evidence of acute abnormality including infarct, hemorrhage, mass lesion, mass effect, midline shift or abnormal extra-axial fluid collection.  No hydrocephalus or  pneumocephalus. The calvarium is intact. Atherosclerosis is noted. Very mild mucosal thickening right sphenoid sinus is noted.  IMPRESSION: No acute abnormality.  Atrophy, chronic microvascular ischemic change and remote small bilateral infarcts.   Electronically Signed   By: Inge Rise M.D.   On: 07/06/2015 10:56   Dg Chest Portable 1 View  07/06/2015   CLINICAL DATA:  Weakness beginning this morning. Left arm pain. Initial encounter.  EXAM: PORTABLE CHEST - 1 VIEW  COMPARISON:  Single view of the chest 01/17/2015.  FINDINGS: The lungs are clear. Heart size is mildly enlarged. No pneumothorax or pleural effusion.  IMPRESSION: No acute disease.   Electronically Signed   By: Inge Rise M.D.   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 Also suspected to have sepsis secondary to UTI.  #1 acute on chronic renal failure-this is likely secondary to the dehydration and hypotension. -Continue gentle IV fluids and follow BUN and creatinine. -Hold lisinopril.  #2 sepsis due to UTI-patient presented with altered mental status, hypotension, lactic acidosis. -Clinically has improved. Currently afebrile, hemodynamically stable. -Continue IV ceftriaxone, IV fluids.  #3 altered mental status-likely secondary to metabolic encephalopathy from the UTI. CT head on admission negative for any acute abnormality. -Mental status is improving. Next  #4 hyperkalemia-this was likely secondary to the acute on chronic renal failure. -Patient has been given some Kayexalate and potassium has normalized now.  #5 chronic systolic CHF-clinically patient is not in CHF. Chest x-ray and admission was negative for any pulmonary edema. -Hold  Lasix given the acute on chronic renal failure and will resume as renal function is back to baseline.  #6 elevated troponin- this is likely in the setting of demand ischemia from acute on chronic renal failure. -Patient has no acute chest pain.  #7 hyperlipidemia-continue atorvastatin.  We'll get a physical therapy evaluation to assess mobility.   All the records are reviewed and case discussed with Care Management/Social Workerr. Management plans discussed with the patient, family and they are in agreement.  CODE STATUS: Full  DVT Prophylaxis: Heparin subcutaneous  TOTAL TIME TAKING CARE OF THIS PATIENT: 30 minutes.   POSSIBLE D/C IN 1-2 DAYS, DEPENDING ON CLINICAL CONDITION.   Henreitta Leber M.D on 07/07/2015 at 1:33 PM  Between 7am to 6pm - Pager - [phone]  After 6pm go to [email or web address] - password [email or web address] [facility]  [place] [facility]  Office  [phone]  CC: Primary care physician; No PCP Per Patient

## 2015-07-07 NOTE — Clinical Social Work Note (Signed)
Clinical Social Work Assessment  Patient Details  Name: Kyle Short MRN: [id number] Date of Birth: 1946/11/28  Date of referral:  07/07/15               Reason for consult:  Facility Placement                Permission sought to share information with:  Family Supports Permission granted to share information::  Yes, Verbal Permission Granted  Name::      (sister, Mable)   Housing/Transportation Living arrangements for the past 2 months:  [place] of Information:  Patient Patient Interpreter Needed:  None Criminal Activity/Legal Involvement Pertinent to Current Situation/Hospitalization:  No - Comment as needed Significant Relationships:  Adult Children, Other Family Members Lives with:  Facility Resident Do you feel safe going back to the place where you live?  Yes Need for family participation in patient care:  Yes (Comment)  Care giving concerns: No issues identified.   Social Worker assessment / plan: CSW met with pt tp address consult as he was admitted from a group home. CSW introduced herself and explained role of social work. Pt stated that he was from [place] (RN understands it to be another one). Pt gave permission to speak to sister, who pt states is his POA. CSW will follow up with family to confirm the group home as well as group home to confirm that pt can return. PT consult is pending for appropriate level of care. CSW will continue to follow.   Employment status:  Disabled (Comment on whether or not currently receiving Disability) Insurance information:  Medicare PT Recommendations:  Not assessed at this time Information / Referral to community resources:  Other (Comment Required)  Patient/Family's Response to care:  Pt was pleasant and appreciative of CSW visit.   Patient/Family's Understanding of and Emotional Response to Diagnosis, Current Treatment, and Prognosis:  Pt would like to return to group home at discharge.    Emotional Assessment Appearance:  Appears stated age Attitude/Demeanor/Rapport:  Other (Pleasant) Affect (typically observed):  Accepting Orientation:  Fluctuating Orientation (Suspected and/or reported Sundowners) Alcohol / Substance use:  Never Used Psych involvement (Current and /or in the community):  No (Comment)  Discharge Needs  Concerns to be addressed:  Adjustment to Illness Readmission within the last 30 days:  No Current discharge risk:  None Barriers to Discharge:  No Barriers Identified   Darden Dates, LCSW 07/07/2015, 5:31 PM

## 2015-07-07 NOTE — Progress Notes (Signed)
Patient has been more oriented today, sleeping intermittently. Sinus rhythm on tele. No complaints of pain. Patient has been incontinent of urine. Not able to move well, has not stood. PT is to work with patient tomorrow. Will continue to monitor.

## 2015-07-07 NOTE — Progress Notes (Signed)
Notified Dr. Verdell Carmine that patient's lungs still sound wet with rhonchi. Questioning fluids being discontinued. MD stated to continue fluids due to kidney function unless patient becomes hypoxic, then call MD back. Will continue to monitor.

## 2015-07-08 LAB — BASIC METABOLIC PANEL
Anion gap: 8 (ref 5–15)
BUN: 53 mg/dL — ABNORMAL HIGH (ref 6–20)
CO2: 20 mmol/L — ABNORMAL LOW (ref 22–32)
Calcium: 8 mg/dL — ABNORMAL LOW (ref 8.9–10.3)
Chloride: 112 mmol/L — ABNORMAL HIGH (ref 101–111)
Creatinine, Ser: 3.09 mg/dL — ABNORMAL HIGH (ref 0.61–1.24)
GFR calc Af Amer: 22 mL/min — ABNORMAL LOW (ref >60)
GFR calc non Af Amer: 19 mL/min — ABNORMAL LOW (ref >60)
Glucose, Bld: 79 mg/dL (ref 65–99)
Potassium: 3.8 mmol/L (ref 3.5–5.1)
Sodium: 140 mmol/L (ref 135–145)

## 2015-07-08 LAB — CBC
HCT: 28.4 % — ABNORMAL LOW (ref 40.0–52.0)
HEMOGLOBIN: 9.3 g/dL — AB (ref 13.0–18.0)
MCH: 27.8 pg (ref 26.0–34.0)
MCHC: 32.6 g/dL (ref 32.0–36.0)
MCV: 85.1 fL (ref 80.0–100.0)
Platelets: 147 10*3/uL — ABNORMAL LOW (ref 150–440)
RBC: 3.34 MIL/uL — ABNORMAL LOW (ref 4.40–5.90)
RDW: 14.4 % (ref 11.5–14.5)
WBC: 25.3 10*3/uL — ABNORMAL HIGH (ref 3.8–10.6)

## 2015-07-08 MED ORDER — CARVEDILOL 12.5 MG PO TABS
12.5000 mg | ORAL_TABLET | Freq: Two times a day (BID) | ORAL | Status: DC
  Administered 2015-07-08 – 2015-07-09 (×3): 12.5 mg via ORAL
  Filled 2015-07-08 (×3): qty 1

## 2015-07-08 MED ORDER — CEFTRIAXONE SODIUM 1 G IJ SOLR
1.0000 g | INTRAMUSCULAR | Status: DC
  Administered 2015-07-08: 1 g via INTRAVENOUS
  Filled 2015-07-08 (×2): qty 10

## 2015-07-08 MED ORDER — ISOSORBIDE MONONITRATE ER 30 MG PO TB24
30.0000 mg | ORAL_TABLET | Freq: Every day | ORAL | Status: DC
  Administered 2015-07-08 – 2015-07-09 (×2): 30 mg via ORAL
  Filled 2015-07-08 (×2): qty 1

## 2015-07-08 NOTE — Progress Notes (Signed)
Initial Nutrition Assessment  DOCUMENTATION CODES:     INTERVENTION:   Meals and Snacks: Cater to patient preferences Coordination of Care: recommend re-weighing pt due to weight difference of almost 20 pounds in 1 day   NUTRITION DIAGNOSIS:   No nutrition diagnosis at this time  GOAL:   Patient will meet greater than or equal to 90% of their needs   MONITOR:    (Energy Intake, Anthropometrics, Electrolyte/Renal Profile, Digestive System)  REASON FOR ASSESSMENT:   Malnutrition Screening Tool    ASSESSMENT:    Pt admitted with weakness, acute on CRF, dehydration, sepsis due to UTI  Past Medical History  Diagnosis Date  . Hypertension   . Lives in group home   . CHF (congestive heart failure)   . Hyperlipidemia     Diet Order:  Diet Heart Room service appropriate?: Yes; Fluid consistency:: Thin   Energy Intake: pt reports good appetite, eating 90-100% of meals  Food and nutrition related history: pt reports good appetite and intake prior to admission  Electrolyte and Renal Profile:  Recent Labs Lab 07/06/15 1023 07/07/15 0511 07/08/15 0524  BUN 53* 61* 53*  CREATININE 4.93* 4.36* 3.09*  NA 138 141 140  K 5.8* 4.2 3.8   Glucose Profile: No results for input(s): GLUCAP in the last 72 hours. Protein Profile:  Recent Labs Lab 07/06/15 1023  ALBUMIN 3.5   Meds: NS at 60 ml/hr  Skin:  Reviewed, no issues  Last BM:  7/24  Height:   Ht Readings from Last 1 Encounters:  07/06/15 6\' 1"  (1.854 m)    Weight: Pt reports weight has been stable  Wt Readings from Last 1 Encounters:  07/06/15 138 lb 6.4 oz (62.778 kg)    Filed Weights   07/06/15 1011 07/06/15 1514  Weight: 157 lb (71.215 kg) 138 lb 6.4 oz (62.778 kg)  Noted discrepancy in weight in 1 day; recommend reweighing   Wt Readings from Last 10 Encounters:  07/06/15 138 lb 6.4 oz (62.778 kg)  06/06/15 160 lb (72.576 kg)    BMI:  Body mass index is 18.26 kg/(m^2).  LOW Care  Level  Kerman Passey MS, [place], LDN [phone] Pager

## 2015-07-08 NOTE — Progress Notes (Signed)
[place] at [place] NAME: Kyle Short    MR#:  [id number]  DATE OF BIRTH:  [birth year]  SUBJECTIVE:  CHIEF COMPLAINT:   Chief Complaint  Patient presents with  . Weakness   Patient here due to generalized weakness but noted to have hypotension with elevated lactic acid and suspected to have sepsis secondary to UTI. Also noted to be in acute on chronic renal failure. Creatinine has improved with IV fluid hydration. Noted to have a leukocytosis but source unclear. Afebrile and hemodynamically stable.  REVIEW OF SYSTEMS:    Review of Systems  Constitutional: Negative for fever and chills.  HENT: Negative for congestion and tinnitus.   Eyes: Negative for blurred vision and double vision.  Respiratory: Negative for cough, shortness of breath and wheezing.   Cardiovascular: Negative for chest pain, orthopnea and PND.  Gastrointestinal: Positive for abdominal pain (generalized). Negative for nausea, vomiting and diarrhea.  Genitourinary: Negative for dysuria and hematuria.  Neurological: Negative for dizziness, sensory change and focal weakness.  All other systems reviewed and are negative.  Nutrition: Heart healthy Tolerating Diet: Yes  DRUG ALLERGIES:  No Known Allergies  VITALS:  Blood pressure 147/88, pulse 67, temperature 97.6 F (36.4 C), temperature source Oral, resp. rate 18, height 6\' 1"  (1.854 m), weight 62.778 kg (138 lb 6.4 oz), SpO2 96 %.  PHYSICAL EXAMINATION:   Physical Exam  GENERAL:  69 y.o.-year-old patient lying in the bed with no acute distress.  EYES: Pupils equal, round, reactive to light and accommodation. No scleral icterus. Extraocular muscles intact.  HEENT: Head atraumatic, normocephalic. Oropharynx and nasopharynx clear.  NECK:  Supple, no jugular venous distention. No thyroid enlargement, no tenderness.  LUNGS: Good air entry bilaterally.  Normal breath sounds bilaterally, no wheezing, rales,  rhonchi. No use of accessory muscles of respiration.  CARDIOVASCULAR: S1, S2 normal. No Murmurs, rubs, or gallops.  ABDOMEN: Soft, nontender, nondistended. Bowel sounds present. No organomegaly or mass.  EXTREMITIES: No cyanosis, clubbing or edema b/l.    NEUROLOGIC: Cranial nerves II through XII are intact. No focal Motor or sensory deficits b/l.   PSYCHIATRIC: The patient is alert and oriented x 3.  SKIN: No obvious rash, lesion, or ulcer.    LABORATORY PANEL:   CBC  Recent Labs Lab 07/08/15 0524  WBC 25.3*  HGB 9.3*  HCT 28.4*  PLT 147*   ------------------------------------------------------------------------------------------------------------------  Chemistries   Recent Labs Lab 07/06/15 1023  07/08/15 0524  NA 138  < > 140  K 5.8*  < > 3.8  CL 109  < > 112*  CO2 17*  < > 20*  GLUCOSE 106*  < > 79  BUN 53*  < > 53*  CREATININE 4.93*  < > 3.09*  CALCIUM 9.3  < > 8.0*  AST 119*  --   --   ALT 63  --   --   ALKPHOS 96  --   --   BILITOT 0.4  --   --   < > = values in this interval not displayed. ------------------------------------------------------------------------------------------------------------------  Cardiac Enzymes  Recent Labs Lab 07/06/15 1023  TROPONINI 0.09*   ------------------------------------------------------------------------------------------------------------------  RADIOLOGY:  No results found.   ASSESSMENT AND PLAN:   69 year old male with past medical history of cardiomyopathy ejection fraction of 20%, chronic kidney disease stage III, hypertension, hyperlipidemia, history of CHF, who presented to the hospital due to weakness dizziness and noted to be in acute on chronic renal failure. Also  suspected to have sepsis secondary to UTI.  #1 acute on chronic renal failure-this is likely secondary to the dehydration and hypotension. -Continue IV fluids and BUN/creatinine are improving. -Hold lisinopril.  #2 sepsis due to  UTI-patient presented with altered mental status, hypotension, lactic acidosis.  Lactic acid has trended down. Clinically afebrile and hemodynamically stable. -Continue IV ceftriaxone, IV fluids.-Blood and urine cultures negative so far. Still has a significant leukocytosis and will continue to follow  #3 altered mental status-likely secondary to metabolic encephalopathy from the UTI. CT head on admission negative for any acute abnormality. -Mental status back to baseline  #4 hyperkalemia-this was likely secondary to the acute on chronic renal failure. -Now resolved with IV fluids as renal function is improved.  #5 chronic systolic CHF-clinically patient is not in CHF. Chest x-ray and admission was negative for any pulmonary edema. -Hold Lasix given the acute on chronic renal failure and will resume as renal function is back to baseline.  #6 elevated troponin- this is likely in the setting of demand ischemia from acute on chronic renal failure. -Patient has no acute chest pain.  #7 hyperlipidemia-continue atorvastatin.  Await PT eval.   All the records are reviewed and case discussed with Care Management/Social Workerr. Management plans discussed with the patient, family and they are in agreement.  CODE STATUS: Full  DVT Prophylaxis: Heparin subcutaneous  TOTAL TIME TAKING CARE OF THIS PATIENT: 30 minutes.   POSSIBLE D/C to group home tomorrow if renal function back to baseline.    Henreitta Leber M.D on 07/08/2015 at 11:30 AM  Between 7am to 6pm - Pager - [phone]  After 6pm go to [email or web address] - password [email or web address] [facility]  [facility]  Office  [phone]  CC: Primary care physician; No PCP Per Patient

## 2015-07-08 NOTE — Care Management (Signed)
Patient presents from assisted living level of care.  Admitted with suspected sepsis due to uti.  Anticipate patient will return to assisted level of care.  CSW aware.

## 2015-07-08 NOTE — Evaluation (Signed)
Physical Therapy Evaluation Patient Details Name: Kyle Short MRN: [id number] DOB: [birth year] Today's Date: 07/08/2015   History of Present Illness  Pt comes into hospital with reports of dizziness and presented with altered mental status. Dx is sepsis from UTI.   Clinical Impression  Pt was ready to complete PT and he wanted to get up and walk. Pt requires min assist for bed mobility and uses rail/HHA. For sit to stand transfer, verbal cuing was required to flex trunk and extend hips with ascending, PT facilitated hip ext during the process, required min assist with RW. Pt was able to ambulate for 152ft with RW/min assist and presents with flexed trunk/heavy UE compensation, which prevents him from extending his hips and completing ankle PF. Pt would benefit from skilled PT in order to complete transfer independently and increase strength/balance/gait speed.     Follow Up Recommendations Home health PT    Equipment Recommendations  Rolling walker with 5" wheels    Recommendations for Other Services       Precautions / Restrictions Precautions Precautions: Fall Restrictions Weight Bearing Restrictions: No      Mobility  Bed Mobility Overal bed mobility: Needs Assistance Bed Mobility: Supine to Sit     Supine to sit: Min assist     General bed mobility comments: Required assist from rail/HHA to transfer to sitting position.  Transfers Overall transfer level: Needs assistance Equipment used: Rolling walker (2 wheeled) Transfers: Sit to/from Stand Sit to Stand: Min assist         General transfer comment: Verbal cuing was required to flex turnk and extend hips with ascending, PT facilitated hip ext during the process.   Ambulation/Gait Ambulation/Gait assistance: Min assist Ambulation Distance (Feet): 180 Feet Assistive device: Rolling walker (2 wheeled) Gait Pattern/deviations: WFL(Within Functional Limits)     General Gait Details: Pt presents with flex  trunk which prevented from from extending his hips and completing ankle PF. Pt relies heavily on cane and kyphotic posture encourages this.  Stairs            Wheelchair Mobility    Modified Rankin (Stroke Patients Only)       Balance Overall balance assessment: Needs assistance Sitting-balance support: Single extremity supported Sitting balance-Leahy Scale: Good Sitting balance - Comments: When changing the pt's clothes, pt leaned to the L side of bed secondary to decreased postural endurance, able to return to normal with no difficulties.    Standing balance support: Bilateral upper extremity supported;Single extremity supported Standing balance-Leahy Scale: Good Standing balance comment: Pt was able to maintain balance while donning/doffing while using only one UE for support with min assist from PT.                              Pertinent Vitals/Pain Pain Assessment: No/denies pain    Home Living Family/patient expects to be discharged to:: Group home                 Additional Comments: Pt has to ascend 9 steps to enter home.    Prior Function Level of Independence: Independent         Comments: Pt has RW at home but doesn't use it.      Hand Dominance        Extremity/Trunk Assessment   Upper Extremity Assessment: Overall WFL for tasks assessed           Lower Extremity Assessment: Overall WFL for  tasks assessed      Cervical / Trunk Assessment: Kyphotic (severe kyphosis, verbal cuing to straighten posture but he always returned to his kyphotic position. )  Communication   Communication: No difficulties  Cognition Arousal/Alertness: Awake/alert Behavior During Therapy: WFL for tasks assessed/performed Overall Cognitive Status: Within Functional Limits for tasks assessed (A & O x 3 intact)                      General Comments      Exercises        Assessment/Plan    PT Assessment    PT Diagnosis  Difficulty walking;Abnormality of gait;Generalized weakness   PT Problem List    PT Treatment Interventions     PT Goals (Current goals can be found in the Care Plan section) Acute Rehab PT Goals Patient Stated Goal: to return home PT Goal Formulation: With patient Time For Goal Achievement: 07/22/15    Frequency Min 2X/week   Barriers to discharge Inaccessible home environment Steps to get into group home.    Co-evaluation               End of Session Equipment Utilized During Treatment: Gait belt Activity Tolerance: Patient tolerated treatment well;Patient limited by fatigue Patient left: in chair;with call bell/phone within reach;with chair alarm set Nurse Communication: Mobility status         Time: [id number] PT Time Calculation (min) (ACUTE ONLY): 36 min   Charges:         PT G Codes:        Bernestine Amass, SPT 07/08/2015 10:57 AM

## 2015-07-08 NOTE — Care Management Important Message (Signed)
Important Message  Patient Details  Name: Kyle Short MRN: [id number] Date of Birth: May 14, 1946   Medicare Important Message Given:  Yes-second notification given    Darius Bump Allmond 07/08/2015, 11:52 AM

## 2015-07-08 NOTE — Clinical Social Work Note (Signed)
CSW confirmed that pt was from a family care home.  Per family care home they will come to look at him today.  CSW will f/u with facility to determine if pt is able to return at [facility].  Contact info is as follows: Rebecca Moodus Junious Dresser (owner of [facility]) [phone]) [phone]

## 2015-07-09 LAB — CBC
HCT: 29.6 % — ABNORMAL LOW (ref 40.0–52.0)
Hemoglobin: 9.6 g/dL — ABNORMAL LOW (ref 13.0–18.0)
MCH: 27.8 pg (ref 26.0–34.0)
MCHC: 32.4 g/dL (ref 32.0–36.0)
MCV: 85.9 fL (ref 80.0–100.0)
Platelets: 157 10*3/uL (ref 150–440)
RBC: 3.45 MIL/uL — AB (ref 4.40–5.90)
RDW: 14.3 % (ref 11.5–14.5)
WBC: 20 10*3/uL — AB (ref 3.8–10.6)

## 2015-07-09 LAB — BASIC METABOLIC PANEL
ANION GAP: 4 — AB (ref 5–15)
BUN: 46 mg/dL — AB (ref 6–20)
CALCIUM: 8.1 mg/dL — AB (ref 8.9–10.3)
CO2: 23 mmol/L (ref 22–32)
Chloride: 112 mmol/L — ABNORMAL HIGH (ref 101–111)
Creatinine, Ser: 2.47 mg/dL — ABNORMAL HIGH (ref 0.61–1.24)
GFR calc Af Amer: 29 mL/min — ABNORMAL LOW (ref >60)
GFR calc non Af Amer: 25 mL/min — ABNORMAL LOW (ref >60)
Glucose, Bld: 93 mg/dL (ref 65–99)
Potassium: 4.1 mmol/L (ref 3.5–5.1)
Sodium: 139 mmol/L (ref 135–145)

## 2015-07-09 LAB — URINE CULTURE

## 2015-07-09 MED ORDER — ACETAMINOPHEN 325 MG PO TABS
650.0000 mg | ORAL_TABLET | Freq: Once | ORAL | Status: AC
  Administered 2015-07-09: 650 mg via ORAL
  Filled 2015-07-09: qty 2

## 2015-07-09 MED ORDER — CEFUROXIME AXETIL 250 MG PO TABS: 250.0000 mg | ORAL_TABLET | Freq: Two times a day (BID) | ORAL | Status: DC

## 2015-07-09 NOTE — Care Management (Signed)
Is for discharge back to the family care home today with home health nursing and physical therapy.  Family care home owner Junious Dresser is agreeable.  Patient is followed at the [facility] and is current with with follow up MD appointments.  Referral called  to [facility].  Order and face to face is in place

## 2015-07-09 NOTE — Clinical Social Work Note (Signed)
CSW notified pt, pt's daughter Davy Pique, facility [facility], and RN that pt would DC today.  Facility will transport.  CSW signing off unless further needs arise.

## 2015-07-09 NOTE — Discharge Instructions (Signed)
°  DIET:  Cardiac diet  DISCHARGE CONDITION:  Stable  ACTIVITY:  Activity as tolerated  OXYGEN:  Home Oxygen: No.   Oxygen Delivery: room air  DISCHARGE LOCATION:  Family Care Home.    If you experience worsening of your admission symptoms, develop shortness of breath, life threatening emergency, suicidal or homicidal thoughts you must seek medical attention immediately by calling 911 or calling your MD immediately  if symptoms less severe.  You Must read complete instructions/literature along with all the possible adverse reactions/side effects for all the Medicines you take and that have been prescribed to you. Take any new Medicines after you have completely understood and accpet all the possible adverse reactions/side effects.   Please note  You were cared for by a hospitalist during your hospital stay. If you have any questions about your discharge medications or the care you received while you were in the hospital after you are discharged, you can call the unit and asked to speak with the hospitalist on call if the hospitalist that took care of you is not available. Once you are discharged, your primary care physician will handle any further medical issues. Please note that NO REFILLS for any discharge medications will be authorized once you are discharged, as it is imperative that you return to your primary care physician (or establish a relationship with a primary care physician if you do not have one) for your aftercare needs so that they can reassess your need for medications and monitor your lab values.

## 2015-07-09 NOTE — Discharge Summary (Signed)
[facility] NAME: Kyle Short    MR#:  [id number]  DATE OF BIRTH:  [birth year]  DATE OF ADMISSION:  07/06/2015 ADMITTING PHYSICIAN: Vaughan Basta, MD  DATE OF DISCHARGE: 07/09/2015  PRIMARY CARE PHYSICIAN: No PCP Per Patient    ADMISSION DIAGNOSIS:  Other specified hypotension [I95.89] Renal failure [N19]  DISCHARGE DIAGNOSIS:  Principal Problem:   Sepsis Active Problems:   UTI (lower urinary tract infection)   Acute on chronic renal failure   SECONDARY DIAGNOSIS:   Past Medical History  Diagnosis Date  . Hypertension   . Lives in group home   . CHF (congestive heart failure)   . Hyperlipidemia     HOSPITAL COURSE:   69 year old male with past medical history of cardiomyopathy ejection fraction of 20%, chronic kidney disease stage III, hypertension, hyperlipidemia, history of CHF, who presented to the hospital due to weakness dizziness and noted to be in acute on chronic renal failure. Also suspected to have sepsis secondary to UTI.  #1 acute on chronic renal failure-this was likely secondary to the dehydration and hypotension. -Patient was hydrated with IV fluids and her creatinine has improved and now back to baseline. His ACE inhibitor was held but now since his renal function is back to baseline that can be resumed.  #2 sepsis due to UTI-patient presented with altered mental status, hypotension, lactic acidosis. Lactic acid has trended down. -Patient has clinically improved as he's been afebrile, hemodynamically stable. His urine cultures grew out Escherichia coli and sensitive to ceftriaxone which is what he was treated with. -Resume patient is being discharged on oral Ceftin for few more days. Patient still has a leukocytosis but the white cell count has been trending down.  #3 altered mental status- was likely secondary to metabolic encephalopathy from the UTI. CT head on admission negative for  any acute abnormality. -Mental status back to baseline now  #4 hyperkalemia-this was likely secondary to the acute on chronic renal failure. -Now resolved with IV fluids and as renal function is improved.  #5 chronic systolic CHF-clinically patient was not in CHF while in the hospital.  #6 elevated troponin- this is likely in the setting of demand ischemia from acute on chronic renal failure. -Patient had no acute chest pain.  #7 hyperlipidemia-continue atorvastatin.  Patient was seen by physical therapy and he recommended home health services which is being arranged for him prior to discharge.  DISCHARGE CONDITIONS:   Stable  CONSULTS OBTAINED:     DRUG ALLERGIES:  No Known Allergies  DISCHARGE MEDICATIONS:   Current Discharge Medication List    START taking these medications   Details  cefUROXime (CEFTIN) 250 MG tablet Take 1 tablet (250 mg total) by mouth 2 (two) times daily with a meal. Qty: 10 tablet, Refills: 0      CONTINUE these medications which have NOT CHANGED   Details  aspirin 81 MG chewable tablet Chew 81 mg by mouth daily.    atorvastatin (LIPITOR) 10 MG tablet Take 10 mg by mouth daily.    bismuth subsalicylate (PEPTO BISMOL) 262 MG chewable tablet Chew 262 mg by mouth daily as needed for indigestion or diarrhea or loose stools.    carvedilol (COREG) 12.5 MG tablet Take 12.5 mg by mouth 2 (two) times daily.    Cholecalciferol (HM VITAMIN D3) 2000 UNITS CAPS Take 2,000 Units by mouth daily.    hydrALAZINE (APRESOLINE) 25 MG tablet Take 25 mg by mouth 4 (four)  times daily.    isosorbide mononitrate (IMDUR) 30 MG 24 hr tablet Take 30 mg by mouth daily.    lisinopril (PRINIVIL,ZESTRIL) 20 MG tablet Take 20 mg by mouth daily.    Melatonin (RA MELATONIN) 10 MG TABS Take 10 mg by mouth at bedtime.         DISCHARGE INSTRUCTIONS:   DIET:  Cardiac diet  DISCHARGE CONDITION:  Stable  ACTIVITY:  Activity as tolerated  OXYGEN:  Home Oxygen:  No.   Oxygen Delivery: room air  DISCHARGE LOCATION:  Family care home with home health PT and nursing   If you experience worsening of your admission symptoms, develop shortness of breath, life threatening emergency, suicidal or homicidal thoughts you must seek medical attention immediately by calling 911 or calling your MD immediately  if symptoms less severe.  You Must read complete instructions/literature along with all the possible adverse reactions/side effects for all the Medicines you take and that have been prescribed to you. Take any new Medicines after you have completely understood and accpet all the possible adverse reactions/side effects.   Please note  You were cared for by a hospitalist during your hospital stay. If you have any questions about your discharge medications or the care you received while you were in the hospital after you are discharged, you can call the unit and asked to speak with the hospitalist on call if the hospitalist that took care of you is not available. Once you are discharged, your primary care physician will handle any further medical issues. Please note that NO REFILLS for any discharge medications will be authorized once you are discharged, as it is imperative that you return to your primary care physician (or establish a relationship with a primary care physician if you do not have one) for your aftercare needs so that they can reassess your need for medications and monitor your lab values.     Today   Patient has no complaints presently. Renal function back to baseline. Afebrile, hemodynamically stable.  VITAL SIGNS:  Blood pressure 179/104, pulse 67, temperature 98.3 F (36.8 C), temperature source Oral, resp. rate 25, height 6\' 1"  (1.854 m), weight 62.778 kg (138 lb 6.4 oz), SpO2 100 %.  I/O:   Intake/Output Summary (Last 24 hours) at 07/09/15 0912 Last data filed at 07/08/15 1826  Gross per 24 hour  Intake    480 ml  Output      0 ml   Net    480 ml    PHYSICAL EXAMINATION:  GENERAL:  69 y.o.-year-old patient lying in the bed with no acute distress.  EYES: Pupils equal, round, reactive to light and accommodation. No scleral icterus. Extraocular muscles intact.  HEENT: Head atraumatic, normocephalic. Oropharynx and nasopharynx clear.  NECK:  Supple, no jugular venous distention. No thyroid enlargement, no tenderness.  LUNGS: Normal breath sounds bilaterally, no wheezing, rales,rhonchi. No use of accessory muscles of respiration.  CARDIOVASCULAR: S1, S2, RRR. Distant Heart sounds. No murmurs, rubs, or gallops.  ABDOMEN: Soft, non-tender, non-distended. Bowel sounds present. No organomegaly or mass.  EXTREMITIES: No pedal edema, cyanosis, or clubbing.  NEUROLOGIC: Cranial nerves II through XII are intact. No focal motor or sensory defecits b/l.  PSYCHIATRIC: The patient is alert and oriented x 3. Flat Affect.  SKIN: No obvious rash, lesion, or ulcer.   DATA REVIEW:   CBC  Recent Labs Lab 07/09/15 0450  WBC 20.0*  HGB 9.6*  HCT 29.6*  PLT 157    Chemistries  Recent Labs Lab 07/06/15 1023  07/09/15 0450  NA 138  < > 139  K 5.8*  < > 4.1  CL 109  < > 112*  CO2 17*  < > 23  GLUCOSE 106*  < > 93  BUN 53*  < > 46*  CREATININE 4.93*  < > 2.47*  CALCIUM 9.3  < > 8.1*  AST 119*  --   --   ALT 63  --   --   ALKPHOS 96  --   --   BILITOT 0.4  --   --   < > = values in this interval not displayed.  Cardiac Enzymes  Recent Labs Lab 07/06/15 1023  TROPONINI 0.09*    Microbiology Results  Results for orders placed or performed during the hospital encounter of 07/06/15  Urine culture     Status: None   Collection Time: 07/06/15 10:23 AM  Result Value Ref Range Status   Specimen Description URINE, RANDOM  Final   Special Requests NONE  Final   Culture >=100,000 COLONIES/mL ESCHERICHIA COLI  Final   Report Status 07/09/2015 FINAL  Final   Organism ID, Bacteria ESCHERICHIA COLI  Final       Susceptibility   Escherichia coli - MIC*    AMPICILLIN >=32 RESISTANT Resistant     CEFTAZIDIME <=1 SENSITIVE Sensitive     CEFAZOLIN <=4 SENSITIVE Sensitive     CEFTRIAXONE <=1 SENSITIVE Sensitive     CIPROFLOXACIN <=0.25 SENSITIVE Sensitive     GENTAMICIN <=1 SENSITIVE Sensitive     IMIPENEM <=0.25 SENSITIVE Sensitive     TRIMETH/SULFA <=20 SENSITIVE Sensitive     NITROFURANTOIN Value in next row Sensitive      SENSITIVE<=16    PIP/TAZO Value in next row Sensitive      SENSITIVE<=4    AMPICILLIN/SULBACTAM Value in next row Sensitive      SENSITIVE8    * >=100,000 COLONIES/mL ESCHERICHIA COLI  Culture, blood (routine x 2)     Status: None (Preliminary result)   Collection Time: 07/06/15 12:26 PM  Result Value Ref Range Status   Specimen Description BLOOD LEFT FATTY CASTS  Final   Special Requests BOTTLES DRAWN AEROBIC AND ANAEROBIC  2CC  Final   Culture NO GROWTH 3 DAYS  Final   Report Status PENDING  Incomplete  MRSA PCR Screening     Status: None   Collection Time: 07/06/15  4:10 PM  Result Value Ref Range Status   MRSA by PCR NEGATIVE NEGATIVE Final    Comment:        The GeneXpert MRSA Assay (FDA approved for NASAL specimens only), is one component of a comprehensive MRSA colonization surveillance program. It is not intended to diagnose MRSA infection nor to guide or monitor treatment for MRSA infections.     RADIOLOGY:  No results found.    Management plans discussed with the patient, family and they are in agreement.  CODE STATUS:     Code Status Orders        Start     Ordered   07/06/15 1513  Full code   Continuous     07/06/15 1512      TOTAL TIME TAKING CARE OF THIS PATIENT: 40 minutes.    Henreitta Leber M.D on 07/09/2015 at 9:12 AM  Between 7am to 6pm - Pager - [phone]  After 6pm go to [email or web address] - password [email or web address] [facility]  Office  [phone]  CC: Primary care physician; No  PCP Per  Patient

## 2015-07-09 NOTE — Progress Notes (Signed)
Removed telemetry and removed iv.  One new RX, in packet of information for group home manager, Junious Dresser, to take with her.  Patient alert and oriented to person and place.  VSS, no complaints of pain.  To be escorted out of hospital via wheelchair by volunteers.

## 2015-07-11 LAB — CULTURE, BLOOD (ROUTINE X 2): CULTURE: NO GROWTH

## 2015-07-12 DIAGNOSIS — R32 Unspecified urinary incontinence: Secondary | ICD-10-CM | POA: Diagnosis not present

## 2015-07-12 DIAGNOSIS — N183 Chronic kidney disease, stage 3 (moderate): Secondary | ICD-10-CM | POA: Diagnosis not present

## 2015-07-12 DIAGNOSIS — E1122 Type 2 diabetes mellitus with diabetic chronic kidney disease: Secondary | ICD-10-CM | POA: Diagnosis not present

## 2015-07-12 DIAGNOSIS — I5022 Chronic systolic (congestive) heart failure: Secondary | ICD-10-CM | POA: Diagnosis not present

## 2015-07-12 DIAGNOSIS — Z87440 Personal history of urinary (tract) infections: Secondary | ICD-10-CM | POA: Diagnosis not present

## 2015-07-12 DIAGNOSIS — Z9181 History of falling: Secondary | ICD-10-CM | POA: Diagnosis not present

## 2015-07-12 DIAGNOSIS — I129 Hypertensive chronic kidney disease with stage 1 through stage 4 chronic kidney disease, or unspecified chronic kidney disease: Secondary | ICD-10-CM | POA: Diagnosis not present

## 2015-07-12 DIAGNOSIS — F0390 Unspecified dementia without behavioral disturbance: Secondary | ICD-10-CM | POA: Diagnosis not present

## 2015-07-12 DIAGNOSIS — E785 Hyperlipidemia, unspecified: Secondary | ICD-10-CM | POA: Diagnosis not present

## 2015-07-15 DIAGNOSIS — F0390 Unspecified dementia without behavioral disturbance: Secondary | ICD-10-CM | POA: Diagnosis not present

## 2015-07-15 DIAGNOSIS — E1122 Type 2 diabetes mellitus with diabetic chronic kidney disease: Secondary | ICD-10-CM | POA: Diagnosis not present

## 2015-07-15 DIAGNOSIS — I5022 Chronic systolic (congestive) heart failure: Secondary | ICD-10-CM | POA: Diagnosis not present

## 2015-07-15 DIAGNOSIS — N183 Chronic kidney disease, stage 3 (moderate): Secondary | ICD-10-CM | POA: Diagnosis not present

## 2015-07-15 DIAGNOSIS — E785 Hyperlipidemia, unspecified: Secondary | ICD-10-CM | POA: Diagnosis not present

## 2015-07-15 DIAGNOSIS — I129 Hypertensive chronic kidney disease with stage 1 through stage 4 chronic kidney disease, or unspecified chronic kidney disease: Secondary | ICD-10-CM | POA: Diagnosis not present

## 2015-07-17 DIAGNOSIS — E1122 Type 2 diabetes mellitus with diabetic chronic kidney disease: Secondary | ICD-10-CM | POA: Diagnosis not present

## 2015-07-17 DIAGNOSIS — I5022 Chronic systolic (congestive) heart failure: Secondary | ICD-10-CM | POA: Diagnosis not present

## 2015-07-17 DIAGNOSIS — N183 Chronic kidney disease, stage 3 (moderate): Secondary | ICD-10-CM | POA: Diagnosis not present

## 2015-07-17 DIAGNOSIS — I129 Hypertensive chronic kidney disease with stage 1 through stage 4 chronic kidney disease, or unspecified chronic kidney disease: Secondary | ICD-10-CM | POA: Diagnosis not present

## 2015-07-17 DIAGNOSIS — E785 Hyperlipidemia, unspecified: Secondary | ICD-10-CM | POA: Diagnosis not present

## 2015-07-17 DIAGNOSIS — F0390 Unspecified dementia without behavioral disturbance: Secondary | ICD-10-CM | POA: Diagnosis not present

## 2015-07-24 DIAGNOSIS — F0390 Unspecified dementia without behavioral disturbance: Secondary | ICD-10-CM | POA: Diagnosis not present

## 2015-07-24 DIAGNOSIS — I5022 Chronic systolic (congestive) heart failure: Secondary | ICD-10-CM | POA: Diagnosis not present

## 2015-07-24 DIAGNOSIS — E1122 Type 2 diabetes mellitus with diabetic chronic kidney disease: Secondary | ICD-10-CM | POA: Diagnosis not present

## 2015-07-24 DIAGNOSIS — E785 Hyperlipidemia, unspecified: Secondary | ICD-10-CM | POA: Diagnosis not present

## 2015-07-24 DIAGNOSIS — N183 Chronic kidney disease, stage 3 (moderate): Secondary | ICD-10-CM | POA: Diagnosis not present

## 2015-07-24 DIAGNOSIS — I129 Hypertensive chronic kidney disease with stage 1 through stage 4 chronic kidney disease, or unspecified chronic kidney disease: Secondary | ICD-10-CM | POA: Diagnosis not present

## 2015-07-25 DIAGNOSIS — I6930 Unspecified sequelae of cerebral infarction: Secondary | ICD-10-CM | POA: Insufficient documentation

## 2015-07-25 DIAGNOSIS — E785 Hyperlipidemia, unspecified: Secondary | ICD-10-CM | POA: Insufficient documentation

## 2015-07-25 DIAGNOSIS — F1021 Alcohol dependence, in remission: Secondary | ICD-10-CM | POA: Insufficient documentation

## 2015-07-25 DIAGNOSIS — E1122 Type 2 diabetes mellitus with diabetic chronic kidney disease: Secondary | ICD-10-CM | POA: Insufficient documentation

## 2015-07-26 ENCOUNTER — Encounter: Payer: Self-pay | Admitting: Family Medicine

## 2015-07-26 ENCOUNTER — Ambulatory Visit (INDEPENDENT_AMBULATORY_CARE_PROVIDER_SITE_OTHER): Payer: Medicare Other | Admitting: Family Medicine

## 2015-07-26 VITALS — BP 102/50 | HR 71 | Temp 98.5°F | Resp 16 | Ht 73.0 in | Wt 140.6 lb

## 2015-07-26 DIAGNOSIS — I429 Cardiomyopathy, unspecified: Secondary | ICD-10-CM

## 2015-07-26 DIAGNOSIS — I509 Heart failure, unspecified: Secondary | ICD-10-CM | POA: Diagnosis not present

## 2015-07-26 DIAGNOSIS — N390 Urinary tract infection, site not specified: Secondary | ICD-10-CM | POA: Diagnosis not present

## 2015-07-26 DIAGNOSIS — N189 Chronic kidney disease, unspecified: Secondary | ICD-10-CM

## 2015-07-26 DIAGNOSIS — F17200 Nicotine dependence, unspecified, uncomplicated: Secondary | ICD-10-CM

## 2015-07-26 DIAGNOSIS — N184 Chronic kidney disease, stage 4 (severe): Secondary | ICD-10-CM | POA: Diagnosis not present

## 2015-07-26 DIAGNOSIS — I129 Hypertensive chronic kidney disease with stage 1 through stage 4 chronic kidney disease, or unspecified chronic kidney disease: Secondary | ICD-10-CM | POA: Diagnosis not present

## 2015-07-26 DIAGNOSIS — F0281 Dementia in other diseases classified elsewhere with behavioral disturbance: Secondary | ICD-10-CM

## 2015-07-26 DIAGNOSIS — G308 Other Alzheimer's disease: Secondary | ICD-10-CM

## 2015-07-26 DIAGNOSIS — E785 Hyperlipidemia, unspecified: Secondary | ICD-10-CM | POA: Diagnosis not present

## 2015-07-26 DIAGNOSIS — I10 Essential (primary) hypertension: Secondary | ICD-10-CM | POA: Diagnosis not present

## 2015-07-26 DIAGNOSIS — E1122 Type 2 diabetes mellitus with diabetic chronic kidney disease: Secondary | ICD-10-CM

## 2015-07-26 DIAGNOSIS — Z09 Encounter for follow-up examination after completed treatment for conditions other than malignant neoplasm: Secondary | ICD-10-CM | POA: Diagnosis not present

## 2015-07-26 DIAGNOSIS — G309 Alzheimer's disease, unspecified: Secondary | ICD-10-CM

## 2015-07-26 DIAGNOSIS — N183 Chronic kidney disease, stage 3 (moderate): Secondary | ICD-10-CM | POA: Diagnosis not present

## 2015-07-26 DIAGNOSIS — I5022 Chronic systolic (congestive) heart failure: Secondary | ICD-10-CM | POA: Diagnosis not present

## 2015-07-26 DIAGNOSIS — F0390 Unspecified dementia without behavioral disturbance: Secondary | ICD-10-CM | POA: Diagnosis not present

## 2015-07-26 DIAGNOSIS — F02818 Dementia in other diseases classified elsewhere, unspecified severity, with other behavioral disturbance: Secondary | ICD-10-CM

## 2015-07-26 LAB — POCT URINALYSIS DIPSTICK
Bilirubin, UA: NEGATIVE
Blood, UA: NEGATIVE
Glucose, UA: NEGATIVE
Ketones, UA: NEGATIVE
NITRITE UA: NEGATIVE
PROTEIN UA: NEGATIVE
Spec Grav, UA: 1.01
UROBILINOGEN UA: NEGATIVE
pH, UA: 5

## 2015-07-26 LAB — POCT GLYCOSYLATED HEMOGLOBIN (HGB A1C): Hemoglobin A1C: 6.3

## 2015-07-26 MED ORDER — LISINOPRIL 10 MG PO TABS: 10.0000 mg | ORAL_TABLET | Freq: Every day | ORAL | Status: DC

## 2015-07-26 NOTE — Assessment & Plan Note (Signed)
Stable. Pt oriented x2. Caregiver reports he is back to baseline. No signs of delerium from recent hospitalization. Was supposed to follow with Dr. Melrose Nakayama (neurology) in May but missed appt. Caregiver will schedule.

## 2015-07-26 NOTE — Assessment & Plan Note (Signed)
Pt is not ready to quit at this time.

## 2015-07-26 NOTE — Assessment & Plan Note (Signed)
A1c controlled at 6.3%.  Pt has opted not to treat with medication. Caregiver checking sugars daily and adhering to diabetic diet. On ACE Declined eye exam. Foot Exam done.

## 2015-07-26 NOTE — Assessment & Plan Note (Addendum)
Recovering renal function. Last GFR 29.  Pt due for follow-up of kidney function with nephrology, missed due to hospital. Caregiver to reschedule.Will need close follow-up with nephrology regarding renal function.   Reduced ACE due to low BP.

## 2015-07-26 NOTE — Progress Notes (Signed)
Subjective:    Patient ID: Kyle Short, male    DOB: 24-Oct-1946, 69 y.o.   MRN: [id number]  HPI: Kyle Short is a 69 y.o. male presenting on 07/26/2015 for Follow-up   HPI  Pt presents for hospital follow-up for UTI/urospesis and acute on chronic renal failure. He was released on 07/09/2015.  He completed antibiotics in the hospital for e.coli UTI. Will repeat urine culture to ensure it's cleared. His kidney function was slowly recovering. Missed appt with Dr. Kristin Bruins during hospital stay, will need to reschedule. Pt currently denies dysuria, change in urine consistency or stream, flank pain, or fever.   Heart failure: Stable. Denies CP, SOB, or edema. Due for follow-up with Dr. Ubaldo Glassing in September.  Dementia: Caregiver reports he is back at his baseline. No delerium from hospital.  Diabetes: pt has chosen not to treat. His caregiver checks sugars daily. He is on the diabetic diet. Declined eye exam.  HTN: Bp's were elevated in the hospital. BP is low today. Caregiver reports it has averaging 100/600 at home. Pt denies dizziness, visual changes or HA.   Pt has home health PT since hospitalization to help with weakness.   Past Medical History  Diagnosis Date  . Hypertension   . Lives in group home   . Hyperlipidemia   . Stroke   . Alcohol abuse   . Chronic kidney disease (CKD) stage G3b/A1, moderately decreased glomerular filtration rate (GFR) between 30-44 mL/min/1.73 square meter and albuminuria creatinine ratio less than 30 mg/g   . Anemia of chronic disease   . Alzheimer's dementia with behavioral disturbance   . Cardiomyopathy due to hypertension, without heart failure   . Myocardial infarction     Current Outpatient Prescriptions on File Prior to Visit  Medication Sig  . aspirin 81 MG chewable tablet Chew 81 mg by mouth daily.  Marland Kitchen atorvastatin (LIPITOR) 10 MG tablet Take 10 mg by mouth daily.  Marland Kitchen bismuth subsalicylate (PEPTO BISMOL) 262 MG chewable tablet Chew 262 mg by  mouth daily as needed for indigestion or diarrhea or loose stools.  . carvedilol (COREG) 12.5 MG tablet Take 12.5 mg by mouth 2 (two) times daily.  . Cholecalciferol (HM VITAMIN D3) 2000 UNITS CAPS Take 2,000 Units by mouth daily.  . hydrALAZINE (APRESOLINE) 25 MG tablet Take 25 mg by mouth 4 (four) times daily.  . isosorbide mononitrate (IMDUR) 30 MG 24 hr tablet Take 30 mg by mouth daily.  . Melatonin (RA MELATONIN) 10 MG TABS Take 10 mg by mouth at bedtime.   No current facility-administered medications on file prior to visit.    Review of Systems  Constitutional: Negative for fever and chills.  Respiratory: Negative for chest tightness, shortness of breath and wheezing.   Cardiovascular: Negative for chest pain.  Gastrointestinal: Negative.  Negative for nausea, vomiting and diarrhea.  Endocrine: Negative for cold intolerance, heat intolerance, polydipsia, polyphagia and polyuria.  Genitourinary: Negative for dysuria, urgency and difficulty urinating.  Neurological: Negative for dizziness, syncope, weakness, light-headedness, numbness and headaches.  Psychiatric/Behavioral: Negative.    Per HPI unless specifically indicated above     Objective:    BP 102/50 mmHg  Pulse 71  Temp(Src) 98.5 F (36.9 C) (Oral)  Resp 16  Ht 6\' 1"  (1.854 m)  Wt 140 lb 9.6 oz (63.776 kg)  BMI 18.55 kg/m2  Wt Readings from Last 3 Encounters:  07/26/15 140 lb 9.6 oz (63.776 kg)  07/06/15 138 lb 6.4 oz (62.778 kg)  06/06/15  160 lb (72.576 kg)    Physical Exam  Constitutional: He appears well-developed and well-nourished. No distress.  HENT:  Head: Normocephalic and atraumatic.  Neck: Neck supple. No thyromegaly present.  Cardiovascular: Normal rate, regular rhythm and normal heart sounds.  Exam reveals no gallop and no friction rub.   No murmur heard. Pulmonary/Chest: Effort normal and breath sounds normal. He has no wheezes.  Abdominal: Soft. Bowel sounds are normal. He exhibits no  distension. There is no tenderness. There is no rebound.  Musculoskeletal: Normal range of motion. He exhibits no edema or tenderness.  Neurological: He is alert. He has normal strength and normal reflexes. He is disoriented (Oriented x 2 (pt baseline)).  Skin: Skin is warm and dry. No rash noted. No erythema.  Psychiatric: He has a normal mood and affect. His behavior is normal. Thought content normal. Cognition and memory are impaired (due to baseline dementia ).   Diabetic Foot Exam - Simple   Simple Foot Form  Diabetic Foot exam was performed with the following findings:  Yes 07/26/2015  1:41 PM  Visual Inspection  No deformities, no ulcerations, no other skin breakdown bilaterally:  Yes  Sensation Testing  Intact to touch and monofilament testing bilaterally:  Yes  Pulse Check  Posterior Tibialis and Dorsalis pulse intact bilaterally:  Yes  Comments     Results for orders placed or performed in visit on 07/26/15  POCT HgB A1C  Result Value Ref Range   Hemoglobin A1C 6.3   POCT urinalysis dipstick  Result Value Ref Range   Color, UA yellow    Clarity, UA clear    Glucose, UA negative    Bilirubin, UA negative    Ketones, UA negative    Spec Grav, UA 1.010    Blood, UA negative    pH, UA 5.0    Protein, UA negative    Urobilinogen, UA negative    Nitrite, UA negative    Leukocytes, UA large (3+) (A) Negative      Assessment & Plan:   Problem List Items Addressed This Visit      Cardiovascular and Mediastinum   Congestive heart failure with cardiomyopathy    Last EF 55%. Following with [facility] cardiology. No active signs of heart failure at this time. On ACE and betablocker.  Due for cardiology follow-up in September 2016.       Relevant Medications   lisinopril (PRINIVIL,ZESTRIL) 10 MG tablet   Benign essential HTN     Well controlled in the past, BP is low today, reduce lisinopril to 10 mg daily. Check BP daily. RTC 1 mos.       Relevant Medications    lisinopril (PRINIVIL,ZESTRIL) 10 MG tablet     Endocrine   Type 2 diabetes mellitus with chronic kidney disease    A1c controlled at 6.3%.  Pt has opted not to treat with medication. Caregiver checking sugars daily and adhering to diabetic diet. On ACE Declined eye exam. Foot Exam done.       Relevant Medications   lisinopril (PRINIVIL,ZESTRIL) 10 MG tablet     Nervous and Auditory   Dementia, Alzheimer's, with behavior disturbance    Stable. Pt oriented x2. Caregiver reports he is back to baseline. No signs of delerium from recent hospitalization. Was supposed to follow with Dr. Melrose Nakayama (neurology) in May but missed appt. Caregiver will schedule.         Genitourinary   UTI (lower urinary tract infection)   Relevant Orders  POCT urinalysis dipstick (Completed)   CULTURE, URINE COMPREHENSIVE   Chronic kidney disease, stage 4, severely decreased GFR    Recovering renal function. Last GFR 29.  Pt due for follow-up of kidney function with nephrology, missed due to hospital. Caregiver to reschedule.Will need close follow-up with nephrology regarding renal function.   Reduced ACE due to low BP.       Relevant Medications   lisinopril (PRINIVIL,ZESTRIL) 10 MG tablet   Other Relevant Orders   Basic Metabolic Panel (BMET)     Other   Compulsive tobacco user syndrome    Pt is not ready to quit at this time.        Other Visit Diagnoses    Hospital discharge follow-up    -  Primary    Reviewed medication changes from recent hospital stay and addressed gaps in care.        Meds ordered this encounter  Medications  . lisinopril (PRINIVIL,ZESTRIL) 10 MG tablet    Sig: Take 1 tablet (10 mg total) by mouth daily.    Dispense:  90 tablet    Refill:  3    Order Specific Question:  Supervising Provider    Answer:  Arlis Porta [id number]      Follow up plan: Return in about 1 month (around 08/26/2015) for BP check. Marland Kitchen

## 2015-07-26 NOTE — Assessment & Plan Note (Addendum)
Well controlled in the past, BP is low today, reduce lisinopril to 10 mg daily. Check BP daily. RTC 1 mos.

## 2015-07-26 NOTE — Assessment & Plan Note (Signed)
Last EF 55%. Following with [facility] cardiology. No active signs of heart failure at this time. On ACE and betablocker.  Due for cardiology follow-up in September 2016.

## 2015-07-26 NOTE — Patient Instructions (Addendum)
Note: Please ensure you schedule an appt with Dr. Ubaldo Glassing in September to check on his heart.  Dr. Melrose Nakayama had wanted to see him for a 2 month follow-up. Please call his office to schedule an appt.   Please schedule with Dr. Kristin Bruins to follow-up on his kidneys.  We will check some labs to make sure his kidney function has returned to baseline.   His blood pressure is low today. I would like to cut his lisinopril to 10mg  daily (cut the pill in 1/2). Please call if his BP are consistently elevated above 150/90.    His A1c of 6.3% is very well controlled. Please keep up the good work. Continue to check his sugars daily. Call if sugars are < 70 or elevated > 300.

## 2015-07-29 ENCOUNTER — Emergency Department
Admission: EM | Admit: 2015-07-29 | Discharge: 2015-07-29 | Disposition: A | Discharge status: Home / Self Care | Payer: Medicare Other | Attending: Emergency Medicine | Admitting: Emergency Medicine

## 2015-07-29 ENCOUNTER — Telehealth: Payer: Self-pay | Admitting: Family Medicine

## 2015-07-29 ENCOUNTER — Encounter: Payer: Self-pay | Admitting: *Deleted

## 2015-07-29 DIAGNOSIS — E1122 Type 2 diabetes mellitus with diabetic chronic kidney disease: Secondary | ICD-10-CM | POA: Insufficient documentation

## 2015-07-29 DIAGNOSIS — N184 Chronic kidney disease, stage 4 (severe): Secondary | ICD-10-CM | POA: Diagnosis not present

## 2015-07-29 DIAGNOSIS — I429 Cardiomyopathy, unspecified: Secondary | ICD-10-CM | POA: Insufficient documentation

## 2015-07-29 DIAGNOSIS — I1310 Hypertensive heart and chronic kidney disease without heart failure, with stage 1 through stage 4 chronic kidney disease, or unspecified chronic kidney disease: Secondary | ICD-10-CM | POA: Insufficient documentation

## 2015-07-29 DIAGNOSIS — F0280 Dementia in other diseases classified elsewhere without behavioral disturbance: Secondary | ICD-10-CM | POA: Insufficient documentation

## 2015-07-29 DIAGNOSIS — Z720 Tobacco use: Secondary | ICD-10-CM | POA: Diagnosis not present

## 2015-07-29 DIAGNOSIS — Z79899 Other long term (current) drug therapy: Secondary | ICD-10-CM | POA: Insufficient documentation

## 2015-07-29 DIAGNOSIS — G309 Alzheimer's disease, unspecified: Secondary | ICD-10-CM | POA: Diagnosis not present

## 2015-07-29 DIAGNOSIS — N390 Urinary tract infection, site not specified: Secondary | ICD-10-CM | POA: Diagnosis not present

## 2015-07-29 DIAGNOSIS — Z7982 Long term (current) use of aspirin: Secondary | ICD-10-CM | POA: Insufficient documentation

## 2015-07-29 LAB — CBC
HCT: 32.4 % — ABNORMAL LOW (ref 40.0–52.0)
Hemoglobin: 10.4 g/dL — ABNORMAL LOW (ref 13.0–18.0)
MCH: 27.8 pg (ref 26.0–34.0)
MCHC: 32.1 g/dL (ref 32.0–36.0)
MCV: 86.5 fL (ref 80.0–100.0)
PLATELETS: 261 10*3/uL (ref 150–440)
RBC: 3.74 MIL/uL — AB (ref 4.40–5.90)
RDW: 13.6 % (ref 11.5–14.5)
WBC: 6.3 10*3/uL (ref 3.8–10.6)

## 2015-07-29 LAB — CULTURE, URINE COMPREHENSIVE

## 2015-07-29 LAB — BASIC METABOLIC PANEL
Anion gap: 9 (ref 5–15)
BUN: 48 mg/dL — ABNORMAL HIGH (ref 6–20)
CALCIUM: 9.3 mg/dL (ref 8.9–10.3)
CO2: 21 mmol/L — ABNORMAL LOW (ref 22–32)
Chloride: 106 mmol/L (ref 101–111)
Creatinine, Ser: 3.75 mg/dL — ABNORMAL HIGH (ref 0.61–1.24)
GFR calc non Af Amer: 15 mL/min — ABNORMAL LOW (ref >60)
GFR, EST AFRICAN AMERICAN: 18 mL/min — AB (ref >60)
Glucose, Bld: 90 mg/dL (ref 65–99)
Potassium: 5.1 mmol/L (ref 3.5–5.1)
Sodium: 136 mmol/L (ref 135–145)

## 2015-07-29 LAB — URINALYSIS COMPLETE WITH MICROSCOPIC (ARMC ONLY)
BILIRUBIN URINE: NEGATIVE
Bacteria, UA: NONE SEEN
Glucose, UA: NEGATIVE mg/dL
HGB URINE DIPSTICK: NEGATIVE
KETONES UR: NEGATIVE mg/dL
Nitrite: NEGATIVE
Protein, ur: NEGATIVE mg/dL
Specific Gravity, Urine: 1.015 (ref 1.005–1.030)
pH: 5 (ref 5.0–8.0)

## 2015-07-29 MED ORDER — DEXTROSE 5 % IV SOLN
1.0000 g | Freq: Once | INTRAVENOUS | Status: AC
  Administered 2015-07-29: 22:00:00 via INTRAVENOUS

## 2015-07-29 MED ORDER — DEXTROSE 5 % IV SOLN
INTRAVENOUS | Status: AC
  Filled 2015-07-29: qty 10

## 2015-07-29 MED ORDER — SODIUM CHLORIDE 0.9 % IV BOLUS (SEPSIS)
500.0000 mL | Freq: Once | INTRAVENOUS | Status: AC
  Administered 2015-07-29: 500 mL via INTRAVENOUS

## 2015-07-29 MED ORDER — CEPHALEXIN 500 MG PO CAPS: 500.0000 mg | ORAL_CAPSULE | Freq: Four times a day (QID) | ORAL | Status: DC

## 2015-07-29 NOTE — Telephone Encounter (Signed)
Called [facility] to speak with a pharmacist about pt urine culture positive for pseudomonas. All susceptibility are IM or IV. Would like to know if there is an oral option or if he needs to go to the ER for evaluation for IV therapy. Liliane Channel, pharmacist, referred me to Vladimir Creeks another clinical pharmacist to discuss the issue. She will call me back.

## 2015-07-29 NOTE — Telephone Encounter (Signed)
Spoke with Junious Dresser- she is hoping home health care can do IM injections.  His home health RN is [facility]. Contact information given for Rebeca Allegra [phone]) from [place]. LMTCB regarding patient care.

## 2015-07-29 NOTE — Telephone Encounter (Signed)
Called and gave [facility] clarification on on BP medication per their request.

## 2015-07-29 NOTE — ED Notes (Signed)
Pt was seen by PCP Friday, urine sample obtained, MD called and told pt culture had ?ecoli in it, to be seen in ER, pt denies any pain or symptoms

## 2015-07-29 NOTE — ED Notes (Signed)
Pt denies urinary sx.  Denies any pain at this time.  Pt alert, but unsure why he is here.  Pt lives in a group home.

## 2015-07-29 NOTE — ED Provider Notes (Signed)
[facility] Emergency Department Provider Note  Time seen: 9:25 PM  I have reviewed the triage vital signs and the nursing notes.   HISTORY  Chief Complaint Urinary Tract Infection    HPI Kyle Short is a 69 y.o. male with a past medical history of hypertension, hyperlipidemia, CVA, alcohol abuse, chronic kidney disease, Alzheimer's dementia, myocardial infarction, presents the emergency department for a possible urinary tract infection. Patient denies any complaints at this time. Denies any dysuria, fever, abdominal pain, however the patient does have a history of dementia and his history is very limited. I discussed the patient with his group home care provider Reynold Bowen, who states that the patient was admitted back in July 23 for a urinary tract infection. He was placed on antibiotics and discharged with approximately 5 days of antibiotics. Patient finished a course of antibiotics and appeared to be doing better. Patient saw a primary care doctor on Friday (5 days ago) and a urine culture was sent. She states the doctor called her today stating the urine culture grew out Escherichia coli, and that the patient would likely need an evaluation to see if he needed more antibiotics. Ms. Vickki Muff denies any changes in mental status, fever, or vomiting at the group home.     Past Medical History  Diagnosis Date  . Hypertension   . Lives in group home   . Hyperlipidemia   . Stroke   . Alcohol abuse   . Chronic kidney disease (CKD) stage G3b/A1, moderately decreased glomerular filtration rate (GFR) between 30-44 mL/min/1.73 square meter and albuminuria creatinine ratio less than 30 mg/g   . Anemia of chronic disease   . Alzheimer's dementia with behavioral disturbance   . Cardiomyopathy due to hypertension, without heart failure   . Myocardial infarction     Patient Active Problem List   Diagnosis Date Noted  . Chronic kidney disease, stage 4, severely  decreased GFR 07/26/2015  . Benign essential HTN 07/26/2015  . Type 2 diabetes mellitus with chronic kidney disease 07/25/2015  . H/O alcohol abuse 07/25/2015  . HLD (hyperlipidemia) 07/25/2015  . Cerebrovascular accident, old 07/25/2015  . Sepsis 07/06/2015  . UTI (lower urinary tract infection) 07/06/2015  . Acute on chronic renal failure 07/06/2015  . Dementia, Alzheimer's, with behavior disturbance 06/07/2015  . Congestive heart failure with cardiomyopathy 06/07/2015  . Renal insufficiency 06/07/2015  . Compulsive tobacco user syndrome 02/26/2015    Past Surgical History  Procedure Laterality Date  . Tonsillectomy      Current Outpatient Rx  Name  Route  Sig  Dispense  Refill  . aspirin 81 MG chewable tablet   Oral   Chew 81 mg by mouth daily.         Marland Kitchen atorvastatin (LIPITOR) 10 MG tablet   Oral   Take 10 mg by mouth daily.         Marland Kitchen bismuth subsalicylate (PEPTO BISMOL) 262 MG chewable tablet   Oral   Chew 262 mg by mouth daily as needed for indigestion or diarrhea or loose stools.         . carvedilol (COREG) 12.5 MG tablet   Oral   Take 12.5 mg by mouth 2 (two) times daily.         . Cholecalciferol (HM VITAMIN D3) 2000 UNITS CAPS   Oral   Take 2,000 Units by mouth daily.         . hydrALAZINE (APRESOLINE) 25 MG tablet   Oral  Take 25 mg by mouth 4 (four) times daily.         . isosorbide mononitrate (IMDUR) 30 MG 24 hr tablet   Oral   Take 30 mg by mouth daily.         Marland Kitchen lisinopril (PRINIVIL,ZESTRIL) 10 MG tablet   Oral   Take 1 tablet (10 mg total) by mouth daily.   90 tablet   3   . Melatonin (RA MELATONIN) 10 MG TABS   Oral   Take 10 mg by mouth at bedtime.           Allergies Review of patient's allergies indicates no known allergies.  Family History  Problem Relation Age of Onset  . Hypertension Other   . Hypertension Mother   . Diabetes Father     Social History Social History  Substance Use Topics  . Smoking  status: Current Every Day Smoker -- 1.00 packs/day    Types: Cigarettes  . Smokeless tobacco: None  . Alcohol Use: No    Review of Systems Constitutional: Negative for fever. Cardiovascular: Negative for chest pain. Respiratory: Negative for shortness of breath. Gastrointestinal: Negative for abdominal pain, vomiting and diarrhea. Genitourinary: Negative for dysuria. Musculoskeletal: Negative for back pain 10-point ROS otherwise negative.  ____________________________________________   PHYSICAL EXAM:  VITAL SIGNS: ED Triage Vitals  Enc Vitals Group     BP 07/29/15 1832 108/60 mmHg     Pulse Rate 07/29/15 1832 60     Resp --      Temp 07/29/15 1832 98.8 F (37.1 C)     Temp Source 07/29/15 1832 Oral     SpO2 07/29/15 1832 100 %     Weight 07/29/15 1832 170 lb (77.111 kg)     Height 07/29/15 1832 6\' 1"  (1.854 m)     Head Cir --      Peak Flow --      Pain Score --      Pain Loc --      Pain Edu? --      Excl. in [place]? --     Constitutional: Alert. Well appearing and in no distress. Eyes: Normal exam ENT   Head: Normocephalic and atraumatic. Dry mucous membranes. Cardiovascular: Normal rate, regular rhythm. Respiratory: Normal respiratory effort without tachypnea nor retractions. Breath sounds are clear and equal bilaterally. No wheezes/rales/rhonchi. Gastrointestinal: Soft and nontender. No distention.   Musculoskeletal: Nontender with normal range of motion in all extremities. Neurologic:  Normal speech and language.  Skin:  Skin is warm, dry and intact.  Psychiatric: Mood and affect are normal. Speech and behavior are normal.   ____________________________________________    INITIAL IMPRESSION / ASSESSMENT AND PLAN / ED COURSE  Pertinent labs & imaging results that were available during my care of the patient were reviewed by me and considered in my medical decision making (see chart for details).  Patient with labs largely at his baseline. He does have a  mild creatinine elevation although, similar to most recent creatinines. Urinalysis doesn't appear infected. We will send a urine culture and dose the patient IV Rocephin. No white blood cell count elevation, no fever. Patient denies any symptoms currently. I discussed with Mrs. Fowler the plan to dose IV antibiotics and discharge home with antibiotics with primary care follow-up in the next 1-2 days for recheck of his urine as well is kidney function. We will dose 500 cc normal saline bolus while in the emergency department.  Patient continues to feel well. We will  discharge patient home on Keflex. We have sent a urine culture. Patient's follow-up with his primary care doctor in 2 days for recheck of his urine as well as kidney function.  ____________________________________________   FINAL CLINICAL IMPRESSION(S) / ED DIAGNOSES  Urinary tract infection   Harvest Dark, MD 07/29/15 2242

## 2015-07-29 NOTE — Telephone Encounter (Signed)
LM for Rebeca Allegra to call back. She did not. Spoke with Junious Dresser- pt is stable- no fevers. But she will not be able to take him ER tomorrow due to conflict. Spoke with Hospitalist on-call- recommendation since patient appears stable to have him reviewed in the ER to get treatment tonight to allow for home health set up. Called charge RN greg- details given. They are aware urine culture is in EPIC. Stella caregiver will take patient to the ER.

## 2015-07-29 NOTE — Telephone Encounter (Signed)
Per [facility] only options are IV or IM.  Called caregiver Junious Dresser to discuss possibility to IM shots BID in his group home vs. New admission to the hospital for care. She was out and left a message at the front desk for her to call me back.

## 2015-07-29 NOTE — Discharge Instructions (Signed)
You have been seen in the emergency department for a urinary tract infection. A urine culture has been sent. Please follow-up with your primary care doctor in the next 2 days for recheck of your urine, as well as your kidney function. Please take your antibiotics as prescribed for their entire course. Return to the emergency department for any nausea, vomiting, fever, abdominal pain, or any other symptom personally concerning to yourself.    Urinary Tract Infection Urinary tract infections (UTIs) can develop anywhere along your urinary tract. Your urinary tract is your body's drainage system for removing wastes and extra water. Your urinary tract includes two kidneys, two ureters, a bladder, and a urethra. Your kidneys are a pair of bean-shaped organs. Each kidney is about the size of your fist. They are located below your ribs, one on each side of your spine. CAUSES Infections are caused by microbes, which are microscopic organisms, including fungi, viruses, and bacteria. These organisms are so small that they can only be seen through a microscope. Bacteria are the microbes that most commonly cause UTIs. SYMPTOMS  Symptoms of UTIs may vary by age and gender of the patient and by the location of the infection. Symptoms in young women typically include a frequent and intense urge to urinate and a painful, burning feeling in the bladder or urethra during urination. Older women and men are more likely to be tired, shaky, and weak and have muscle aches and abdominal pain. A fever may mean the infection is in your kidneys. Other symptoms of a kidney infection include pain in your back or sides below the ribs, nausea, and vomiting. DIAGNOSIS To diagnose a UTI, your caregiver will ask you about your symptoms. Your caregiver also will ask to provide a urine sample. The urine sample will be tested for bacteria and white blood cells. White blood cells are made by your body to help fight infection. TREATMENT    Typically, UTIs can be treated with medication. Because most UTIs are caused by a bacterial infection, they usually can be treated with the use of antibiotics. The choice of antibiotic and length of treatment depend on your symptoms and the type of bacteria causing your infection. HOME CARE INSTRUCTIONS  If you were prescribed antibiotics, take them exactly as your caregiver instructs you. Finish the medication even if you feel better after you have only taken some of the medication.  Drink enough water and fluids to keep your urine clear or pale yellow.  Avoid caffeine, tea, and carbonated beverages. They tend to irritate your bladder.  Empty your bladder often. Avoid holding urine for long periods of time.  Empty your bladder before and after sexual intercourse.  After a bowel movement, women should cleanse from front to back. Use each tissue only once. SEEK MEDICAL CARE IF:   You have back pain.  You develop a fever.  Your symptoms do not begin to resolve within 3 days. SEEK IMMEDIATE MEDICAL CARE IF:   You have severe back pain or lower abdominal pain.  You develop chills.  You have nausea or vomiting.  You have continued burning or discomfort with urination. MAKE SURE YOU:   Understand these instructions.  Will watch your condition.  Will get help right away if you are not doing well or get worse. Document Released: 09/09/2005 Document Revised: 05/31/2012 Document Reviewed: 01/08/2012 [facility] Patient Information 2015 [place], [place]. This information is not intended to replace advice given to you by your health care provider. Make sure you discuss  any questions you have with your health care provider. ° °

## 2015-07-30 ENCOUNTER — Telehealth: Payer: Self-pay | Admitting: Family Medicine

## 2015-07-30 DIAGNOSIS — F0390 Unspecified dementia without behavioral disturbance: Secondary | ICD-10-CM | POA: Diagnosis not present

## 2015-07-30 DIAGNOSIS — I5022 Chronic systolic (congestive) heart failure: Secondary | ICD-10-CM | POA: Diagnosis not present

## 2015-07-30 DIAGNOSIS — N183 Chronic kidney disease, stage 3 (moderate): Secondary | ICD-10-CM | POA: Diagnosis not present

## 2015-07-30 DIAGNOSIS — E785 Hyperlipidemia, unspecified: Secondary | ICD-10-CM | POA: Diagnosis not present

## 2015-07-30 DIAGNOSIS — N3000 Acute cystitis without hematuria: Secondary | ICD-10-CM

## 2015-07-30 DIAGNOSIS — E1122 Type 2 diabetes mellitus with diabetic chronic kidney disease: Secondary | ICD-10-CM | POA: Diagnosis not present

## 2015-07-30 DIAGNOSIS — I129 Hypertensive chronic kidney disease with stage 1 through stage 4 chronic kidney disease, or unspecified chronic kidney disease: Secondary | ICD-10-CM | POA: Diagnosis not present

## 2015-07-30 LAB — BASIC METABOLIC PANEL
BUN/Creatinine Ratio: 13 (ref 10–22)
BUN: 46 mg/dL — ABNORMAL HIGH (ref 8–27)
CHLORIDE: 101 mmol/L (ref 97–108)
CO2: 19 mmol/L (ref 18–29)
Calcium: 9.3 mg/dL (ref 8.6–10.2)
Creatinine, Ser: 3.52 mg/dL — ABNORMAL HIGH (ref 0.76–1.27)
GFR calc Af Amer: 20 mL/min/{1.73_m2} — ABNORMAL LOW (ref >59)
GFR calc non Af Amer: 17 mL/min/{1.73_m2} — ABNORMAL LOW (ref >59)
GLUCOSE: 128 mg/dL — AB (ref 65–99)
POTASSIUM: 5.1 mmol/L (ref 3.5–5.2)
Sodium: 136 mmol/L (ref 134–144)

## 2015-07-30 NOTE — Telephone Encounter (Signed)
Called pt caregiver regarding ER visit last night. The Keflex they gave him will not treat the pseudomonas found in his urine. Recommend not taking. Will speak with home health today regarding feasibility of Fortaz injections to treat pseudomonas.

## 2015-07-30 NOTE — Telephone Encounter (Signed)
Lear [phone]- Home Health RN called about setting up Fortaz injections. LMTCB.

## 2015-07-31 DIAGNOSIS — I129 Hypertensive chronic kidney disease with stage 1 through stage 4 chronic kidney disease, or unspecified chronic kidney disease: Secondary | ICD-10-CM | POA: Diagnosis not present

## 2015-07-31 DIAGNOSIS — E785 Hyperlipidemia, unspecified: Secondary | ICD-10-CM | POA: Diagnosis not present

## 2015-07-31 DIAGNOSIS — E1122 Type 2 diabetes mellitus with diabetic chronic kidney disease: Secondary | ICD-10-CM | POA: Diagnosis not present

## 2015-07-31 DIAGNOSIS — I5022 Chronic systolic (congestive) heart failure: Secondary | ICD-10-CM | POA: Diagnosis not present

## 2015-07-31 DIAGNOSIS — N183 Chronic kidney disease, stage 3 (moderate): Secondary | ICD-10-CM | POA: Diagnosis not present

## 2015-07-31 DIAGNOSIS — F0390 Unspecified dementia without behavioral disturbance: Secondary | ICD-10-CM | POA: Diagnosis not present

## 2015-07-31 MED ORDER — CEFEPIME HCL 1 G IJ SOLR: 500.0000 mg | INTRAMUSCULAR | Status: AC

## 2015-07-31 NOTE — Telephone Encounter (Signed)
Spoke with Danae Chen- HH will be able to AM injection but will not be able to PM.  Will speak with caregiver Junious Dresser about feasibility of doing injections at night in the home. She is willing. Will speak with [facility]. This is possible. Cr CL is 17.32ml/min- will dose 500mg  q24hours for 7 days. Spoke with Liliane Channel at [employer]- orders sent. Will be dissolved in lidocaine.  Danae Chen RN notified of orders.

## 2015-08-01 DIAGNOSIS — E1122 Type 2 diabetes mellitus with diabetic chronic kidney disease: Secondary | ICD-10-CM | POA: Diagnosis not present

## 2015-08-01 DIAGNOSIS — E785 Hyperlipidemia, unspecified: Secondary | ICD-10-CM | POA: Diagnosis not present

## 2015-08-01 DIAGNOSIS — I129 Hypertensive chronic kidney disease with stage 1 through stage 4 chronic kidney disease, or unspecified chronic kidney disease: Secondary | ICD-10-CM | POA: Diagnosis not present

## 2015-08-01 DIAGNOSIS — N183 Chronic kidney disease, stage 3 (moderate): Secondary | ICD-10-CM | POA: Diagnosis not present

## 2015-08-01 DIAGNOSIS — I5022 Chronic systolic (congestive) heart failure: Secondary | ICD-10-CM | POA: Diagnosis not present

## 2015-08-01 DIAGNOSIS — F0390 Unspecified dementia without behavioral disturbance: Secondary | ICD-10-CM | POA: Diagnosis not present

## 2015-08-01 LAB — URINE CULTURE

## 2015-08-02 ENCOUNTER — Telehealth: Payer: Self-pay | Admitting: Family Medicine

## 2015-08-02 NOTE — Telephone Encounter (Signed)
Relayed appt information to [facility] (caregiver). Faxed ov hfu notes, blood work and urine culture results to [employer].

## 2015-08-02 NOTE — Telephone Encounter (Signed)
Stella caregiver called. Pt has not had HH RN injections of cefepime. Called Danae Chen Ways RN- she needed [facility]ed [facility]. This was not made clear on Wednesday. Verbal order given. Paper orders faxed to [phone]

## 2015-08-02 NOTE — Telephone Encounter (Signed)
Can you please let Kyle Short (caregiver) know that I made Kyle Short an appt on Friday, August 09, 2015 at 2pm at [facility].  He will be seeing Dr. Juleen China.  Also can we fax his urine cultures, hospital follow-up notes, and recent BMP to [place]?  Their number is [phone]  Thanks! Ak

## 2015-08-06 DIAGNOSIS — E785 Hyperlipidemia, unspecified: Secondary | ICD-10-CM | POA: Diagnosis not present

## 2015-08-06 DIAGNOSIS — N183 Chronic kidney disease, stage 3 (moderate): Secondary | ICD-10-CM | POA: Diagnosis not present

## 2015-08-06 DIAGNOSIS — I129 Hypertensive chronic kidney disease with stage 1 through stage 4 chronic kidney disease, or unspecified chronic kidney disease: Secondary | ICD-10-CM | POA: Diagnosis not present

## 2015-08-06 DIAGNOSIS — E1122 Type 2 diabetes mellitus with diabetic chronic kidney disease: Secondary | ICD-10-CM | POA: Diagnosis not present

## 2015-08-06 DIAGNOSIS — F0390 Unspecified dementia without behavioral disturbance: Secondary | ICD-10-CM | POA: Diagnosis not present

## 2015-08-06 DIAGNOSIS — I5022 Chronic systolic (congestive) heart failure: Secondary | ICD-10-CM | POA: Diagnosis not present

## 2015-08-07 DIAGNOSIS — E1122 Type 2 diabetes mellitus with diabetic chronic kidney disease: Secondary | ICD-10-CM | POA: Diagnosis not present

## 2015-08-07 DIAGNOSIS — E785 Hyperlipidemia, unspecified: Secondary | ICD-10-CM | POA: Diagnosis not present

## 2015-08-07 DIAGNOSIS — I5022 Chronic systolic (congestive) heart failure: Secondary | ICD-10-CM | POA: Diagnosis not present

## 2015-08-07 DIAGNOSIS — I129 Hypertensive chronic kidney disease with stage 1 through stage 4 chronic kidney disease, or unspecified chronic kidney disease: Secondary | ICD-10-CM | POA: Diagnosis not present

## 2015-08-07 DIAGNOSIS — F0390 Unspecified dementia without behavioral disturbance: Secondary | ICD-10-CM | POA: Diagnosis not present

## 2015-08-07 DIAGNOSIS — N183 Chronic kidney disease, stage 3 (moderate): Secondary | ICD-10-CM | POA: Diagnosis not present

## 2015-08-12 DIAGNOSIS — I5022 Chronic systolic (congestive) heart failure: Secondary | ICD-10-CM | POA: Diagnosis not present

## 2015-08-12 DIAGNOSIS — N183 Chronic kidney disease, stage 3 (moderate): Secondary | ICD-10-CM | POA: Diagnosis not present

## 2015-08-12 DIAGNOSIS — E785 Hyperlipidemia, unspecified: Secondary | ICD-10-CM | POA: Diagnosis not present

## 2015-08-12 DIAGNOSIS — F0390 Unspecified dementia without behavioral disturbance: Secondary | ICD-10-CM | POA: Diagnosis not present

## 2015-08-12 DIAGNOSIS — I129 Hypertensive chronic kidney disease with stage 1 through stage 4 chronic kidney disease, or unspecified chronic kidney disease: Secondary | ICD-10-CM | POA: Diagnosis not present

## 2015-08-12 DIAGNOSIS — E1122 Type 2 diabetes mellitus with diabetic chronic kidney disease: Secondary | ICD-10-CM | POA: Diagnosis not present

## 2015-08-14 DIAGNOSIS — E785 Hyperlipidemia, unspecified: Secondary | ICD-10-CM | POA: Diagnosis not present

## 2015-08-14 DIAGNOSIS — E1122 Type 2 diabetes mellitus with diabetic chronic kidney disease: Secondary | ICD-10-CM | POA: Diagnosis not present

## 2015-08-14 DIAGNOSIS — N183 Chronic kidney disease, stage 3 (moderate): Secondary | ICD-10-CM | POA: Diagnosis not present

## 2015-08-14 DIAGNOSIS — I5022 Chronic systolic (congestive) heart failure: Secondary | ICD-10-CM | POA: Diagnosis not present

## 2015-08-14 DIAGNOSIS — F0390 Unspecified dementia without behavioral disturbance: Secondary | ICD-10-CM | POA: Diagnosis not present

## 2015-08-14 DIAGNOSIS — I129 Hypertensive chronic kidney disease with stage 1 through stage 4 chronic kidney disease, or unspecified chronic kidney disease: Secondary | ICD-10-CM | POA: Diagnosis not present

## 2015-08-16 DIAGNOSIS — N179 Acute kidney failure, unspecified: Secondary | ICD-10-CM | POA: Diagnosis not present

## 2015-08-16 DIAGNOSIS — R809 Proteinuria, unspecified: Secondary | ICD-10-CM | POA: Diagnosis not present

## 2015-08-16 DIAGNOSIS — F0390 Unspecified dementia without behavioral disturbance: Secondary | ICD-10-CM | POA: Diagnosis not present

## 2015-08-16 DIAGNOSIS — E785 Hyperlipidemia, unspecified: Secondary | ICD-10-CM | POA: Diagnosis not present

## 2015-08-16 DIAGNOSIS — E1122 Type 2 diabetes mellitus with diabetic chronic kidney disease: Secondary | ICD-10-CM | POA: Diagnosis not present

## 2015-08-16 DIAGNOSIS — N183 Chronic kidney disease, stage 3 (moderate): Secondary | ICD-10-CM | POA: Diagnosis not present

## 2015-08-16 DIAGNOSIS — I10 Essential (primary) hypertension: Secondary | ICD-10-CM | POA: Diagnosis not present

## 2015-08-16 DIAGNOSIS — I129 Hypertensive chronic kidney disease with stage 1 through stage 4 chronic kidney disease, or unspecified chronic kidney disease: Secondary | ICD-10-CM | POA: Diagnosis not present

## 2015-08-16 DIAGNOSIS — I5022 Chronic systolic (congestive) heart failure: Secondary | ICD-10-CM | POA: Diagnosis not present

## 2015-08-20 ENCOUNTER — Telehealth: Payer: Self-pay | Admitting: *Deleted

## 2015-08-20 ENCOUNTER — Other Ambulatory Visit: Payer: Self-pay | Admitting: *Deleted

## 2015-08-20 MED ORDER — ATORVASTATIN CALCIUM 10 MG PO TABS: 10.0000 mg | ORAL_TABLET | Freq: Every day | ORAL | Status: DC

## 2015-08-20 MED ORDER — HYDRALAZINE HCL 25 MG PO TABS: 25.0000 mg | ORAL_TABLET | Freq: Four times a day (QID) | ORAL | Status: DC

## 2015-08-20 NOTE — Telephone Encounter (Signed)
Review of chart indicates that patient was on CArvedilol 12.5 mg, 1 twice a day on l;ast visit here in mid-August.  If he is on higher dose now, I am unaware.  If someone else increased that dose, then they should be contacted about refilling it.  I authorize Carvedilol 12.5 mg., 1 twice a day (#60/6 refills).  This can be sent to his Pharmacy from this office.-jh

## 2015-08-20 NOTE — Telephone Encounter (Signed)
Pt saw Amy on 07/26/15. Pt on Lisinopril 10 mg 1/2 tab daily. Carvedilol was not mentioned in note. [facility] sent refill request for Carvedilol 25 mg 1 tab twice daily. Please advise

## 2015-08-21 ENCOUNTER — Other Ambulatory Visit: Payer: Self-pay | Admitting: *Deleted

## 2015-08-21 MED ORDER — CARVEDILOL 12.5 MG PO TABS: 12.5000 mg | ORAL_TABLET | Freq: Two times a day (BID) | ORAL | Status: DC

## 2015-08-21 NOTE — Telephone Encounter (Signed)
Refill submitted. 

## 2015-08-22 DIAGNOSIS — I129 Hypertensive chronic kidney disease with stage 1 through stage 4 chronic kidney disease, or unspecified chronic kidney disease: Secondary | ICD-10-CM | POA: Diagnosis not present

## 2015-08-22 DIAGNOSIS — E1122 Type 2 diabetes mellitus with diabetic chronic kidney disease: Secondary | ICD-10-CM | POA: Diagnosis not present

## 2015-08-22 DIAGNOSIS — I5022 Chronic systolic (congestive) heart failure: Secondary | ICD-10-CM | POA: Diagnosis not present

## 2015-08-22 DIAGNOSIS — E785 Hyperlipidemia, unspecified: Secondary | ICD-10-CM | POA: Diagnosis not present

## 2015-08-22 DIAGNOSIS — N183 Chronic kidney disease, stage 3 (moderate): Secondary | ICD-10-CM | POA: Diagnosis not present

## 2015-08-22 DIAGNOSIS — F0390 Unspecified dementia without behavioral disturbance: Secondary | ICD-10-CM | POA: Diagnosis not present

## 2015-08-23 ENCOUNTER — Telehealth: Payer: Self-pay

## 2015-08-23 DIAGNOSIS — G4720 Circadian rhythm sleep disorder, unspecified type: Secondary | ICD-10-CM

## 2015-08-23 DIAGNOSIS — I10 Essential (primary) hypertension: Secondary | ICD-10-CM

## 2015-08-23 MED ORDER — ISOSORBIDE MONONITRATE ER 30 MG PO TB24
30.0000 mg | ORAL_TABLET | Freq: Every day | ORAL | Status: DC
Start: 2015-08-23 — End: 2015-10-28

## 2015-08-23 MED ORDER — ASPIRIN 81 MG PO CHEW: 81.0000 mg | CHEWABLE_TABLET | Freq: Every day | ORAL | Status: DC

## 2015-08-23 MED ORDER — MELATONIN 10 MG PO CAPS: 10.0000 mg | ORAL_CAPSULE | Freq: Every evening | ORAL | Status: DC | PRN

## 2015-08-23 NOTE — Telephone Encounter (Signed)
Patient needing refill on Imdur 30mg  daily .Marland KitchenMarland KitchenAspirin 81 mg chew daily and Melatonin 10mg  at HS. I verbally authorized as they have been faxing to [place] and it will not go through.[facility]

## 2015-08-23 NOTE — Telephone Encounter (Signed)
Ok-jh 

## 2015-08-26 ENCOUNTER — Telehealth: Payer: Self-pay

## 2015-08-26 DIAGNOSIS — N183 Chronic kidney disease, stage 3 (moderate): Secondary | ICD-10-CM | POA: Diagnosis not present

## 2015-08-26 DIAGNOSIS — E1122 Type 2 diabetes mellitus with diabetic chronic kidney disease: Secondary | ICD-10-CM | POA: Diagnosis not present

## 2015-08-26 DIAGNOSIS — I129 Hypertensive chronic kidney disease with stage 1 through stage 4 chronic kidney disease, or unspecified chronic kidney disease: Secondary | ICD-10-CM | POA: Diagnosis not present

## 2015-08-26 DIAGNOSIS — E785 Hyperlipidemia, unspecified: Secondary | ICD-10-CM | POA: Diagnosis not present

## 2015-08-26 DIAGNOSIS — F0390 Unspecified dementia without behavioral disturbance: Secondary | ICD-10-CM | POA: Diagnosis not present

## 2015-08-26 DIAGNOSIS — I5022 Chronic systolic (congestive) heart failure: Secondary | ICD-10-CM | POA: Diagnosis not present

## 2015-08-26 NOTE — Telephone Encounter (Signed)
Called pharmacy and gave direction.

## 2015-08-26 NOTE — Telephone Encounter (Signed)
May take Melatonin at bedtime each night for sleep.-jh

## 2015-08-26 NOTE — Telephone Encounter (Signed)
Pharmacy called regarding direction for Melatonin they receive faxed as directed and e scribe for as needed they want to know which one to proceed with because in past he is taking one at bed time ? Nisha

## 2015-09-04 DIAGNOSIS — I429 Cardiomyopathy, unspecified: Secondary | ICD-10-CM | POA: Diagnosis not present

## 2015-09-04 DIAGNOSIS — E119 Type 2 diabetes mellitus without complications: Secondary | ICD-10-CM | POA: Diagnosis not present

## 2015-09-04 DIAGNOSIS — M6281 Muscle weakness (generalized): Secondary | ICD-10-CM | POA: Diagnosis not present

## 2015-09-04 DIAGNOSIS — E782 Mixed hyperlipidemia: Secondary | ICD-10-CM | POA: Diagnosis not present

## 2015-09-04 DIAGNOSIS — I10 Essential (primary) hypertension: Secondary | ICD-10-CM | POA: Diagnosis not present

## 2015-09-05 DIAGNOSIS — E785 Hyperlipidemia, unspecified: Secondary | ICD-10-CM | POA: Diagnosis not present

## 2015-09-05 DIAGNOSIS — E1122 Type 2 diabetes mellitus with diabetic chronic kidney disease: Secondary | ICD-10-CM | POA: Diagnosis not present

## 2015-09-05 DIAGNOSIS — I5022 Chronic systolic (congestive) heart failure: Secondary | ICD-10-CM | POA: Diagnosis not present

## 2015-09-05 DIAGNOSIS — F0390 Unspecified dementia without behavioral disturbance: Secondary | ICD-10-CM | POA: Diagnosis not present

## 2015-09-05 DIAGNOSIS — N183 Chronic kidney disease, stage 3 (moderate): Secondary | ICD-10-CM | POA: Diagnosis not present

## 2015-09-05 DIAGNOSIS — I129 Hypertensive chronic kidney disease with stage 1 through stage 4 chronic kidney disease, or unspecified chronic kidney disease: Secondary | ICD-10-CM | POA: Diagnosis not present

## 2015-09-10 DIAGNOSIS — N183 Chronic kidney disease, stage 3 (moderate): Secondary | ICD-10-CM | POA: Diagnosis not present

## 2015-09-10 DIAGNOSIS — I129 Hypertensive chronic kidney disease with stage 1 through stage 4 chronic kidney disease, or unspecified chronic kidney disease: Secondary | ICD-10-CM | POA: Diagnosis not present

## 2015-09-10 DIAGNOSIS — F0390 Unspecified dementia without behavioral disturbance: Secondary | ICD-10-CM | POA: Diagnosis not present

## 2015-09-10 DIAGNOSIS — E1122 Type 2 diabetes mellitus with diabetic chronic kidney disease: Secondary | ICD-10-CM | POA: Diagnosis not present

## 2015-09-10 DIAGNOSIS — Z9181 History of falling: Secondary | ICD-10-CM | POA: Diagnosis not present

## 2015-09-10 DIAGNOSIS — R32 Unspecified urinary incontinence: Secondary | ICD-10-CM | POA: Diagnosis not present

## 2015-09-10 DIAGNOSIS — E785 Hyperlipidemia, unspecified: Secondary | ICD-10-CM | POA: Diagnosis not present

## 2015-09-10 DIAGNOSIS — I5022 Chronic systolic (congestive) heart failure: Secondary | ICD-10-CM | POA: Diagnosis not present

## 2015-09-10 DIAGNOSIS — Z87440 Personal history of urinary (tract) infections: Secondary | ICD-10-CM | POA: Diagnosis not present

## 2015-09-11 ENCOUNTER — Encounter: Payer: Self-pay | Admitting: Emergency Medicine

## 2015-09-11 ENCOUNTER — Observation Stay: Payer: Medicare Other

## 2015-09-11 ENCOUNTER — Emergency Department: Payer: Medicare Other

## 2015-09-11 ENCOUNTER — Inpatient Hospital Stay
Admission: EM | Admit: 2015-09-11 | Discharge: 2015-09-13 | DRG: 312 | Disposition: A | Discharge status: Home Health | Payer: Medicare Other | Attending: Internal Medicine | Admitting: Internal Medicine

## 2015-09-11 ENCOUNTER — Observation Stay (HOSPITAL_COMMUNITY)
Admit: 2015-09-11 | Discharge: 2015-09-11 | Disposition: A | Discharge status: Home / Self Care | Payer: Medicare Other | Attending: Internal Medicine | Admitting: Internal Medicine

## 2015-09-11 DIAGNOSIS — G309 Alzheimer's disease, unspecified: Secondary | ICD-10-CM | POA: Diagnosis not present

## 2015-09-11 DIAGNOSIS — R749 Abnormal serum enzyme level, unspecified: Secondary | ICD-10-CM | POA: Diagnosis not present

## 2015-09-11 DIAGNOSIS — F17210 Nicotine dependence, cigarettes, uncomplicated: Secondary | ICD-10-CM | POA: Diagnosis present

## 2015-09-11 DIAGNOSIS — G9340 Encephalopathy, unspecified: Secondary | ICD-10-CM | POA: Diagnosis not present

## 2015-09-11 DIAGNOSIS — R531 Weakness: Secondary | ICD-10-CM | POA: Diagnosis not present

## 2015-09-11 DIAGNOSIS — F0281 Dementia in other diseases classified elsewhere with behavioral disturbance: Secondary | ICD-10-CM | POA: Diagnosis present

## 2015-09-11 DIAGNOSIS — I951 Orthostatic hypotension: Secondary | ICD-10-CM | POA: Diagnosis not present

## 2015-09-11 DIAGNOSIS — N184 Chronic kidney disease, stage 4 (severe): Secondary | ICD-10-CM | POA: Diagnosis present

## 2015-09-11 DIAGNOSIS — E119 Type 2 diabetes mellitus without complications: Secondary | ICD-10-CM | POA: Diagnosis present

## 2015-09-11 DIAGNOSIS — R55 Syncope and collapse: Secondary | ICD-10-CM

## 2015-09-11 DIAGNOSIS — I248 Other forms of acute ischemic heart disease: Secondary | ICD-10-CM | POA: Diagnosis not present

## 2015-09-11 DIAGNOSIS — R42 Dizziness and giddiness: Secondary | ICD-10-CM | POA: Diagnosis not present

## 2015-09-11 DIAGNOSIS — R748 Abnormal levels of other serum enzymes: Secondary | ICD-10-CM | POA: Diagnosis present

## 2015-09-11 DIAGNOSIS — Z8679 Personal history of other diseases of the circulatory system: Secondary | ICD-10-CM

## 2015-09-11 DIAGNOSIS — I639 Cerebral infarction, unspecified: Secondary | ICD-10-CM

## 2015-09-11 DIAGNOSIS — B965 Pseudomonas (aeruginosa) (mallei) (pseudomallei) as the cause of diseases classified elsewhere: Secondary | ICD-10-CM | POA: Diagnosis present

## 2015-09-11 DIAGNOSIS — N390 Urinary tract infection, site not specified: Secondary | ICD-10-CM | POA: Diagnosis present

## 2015-09-11 DIAGNOSIS — R000 Tachycardia, unspecified: Secondary | ICD-10-CM | POA: Diagnosis not present

## 2015-09-11 DIAGNOSIS — Z7982 Long term (current) use of aspirin: Secondary | ICD-10-CM

## 2015-09-11 DIAGNOSIS — R7989 Other specified abnormal findings of blood chemistry: Secondary | ICD-10-CM | POA: Diagnosis not present

## 2015-09-11 DIAGNOSIS — I119 Hypertensive heart disease without heart failure: Secondary | ICD-10-CM | POA: Diagnosis present

## 2015-09-11 DIAGNOSIS — J9811 Atelectasis: Secondary | ICD-10-CM | POA: Diagnosis not present

## 2015-09-11 DIAGNOSIS — F0280 Dementia in other diseases classified elsewhere without behavioral disturbance: Secondary | ICD-10-CM | POA: Diagnosis present

## 2015-09-11 DIAGNOSIS — Z8673 Personal history of transient ischemic attack (TIA), and cerebral infarction without residual deficits: Secondary | ICD-10-CM

## 2015-09-11 DIAGNOSIS — E785 Hyperlipidemia, unspecified: Secondary | ICD-10-CM | POA: Diagnosis present

## 2015-09-11 DIAGNOSIS — N189 Chronic kidney disease, unspecified: Secondary | ICD-10-CM | POA: Diagnosis not present

## 2015-09-11 DIAGNOSIS — Z79899 Other long term (current) drug therapy: Secondary | ICD-10-CM

## 2015-09-11 DIAGNOSIS — I493 Ventricular premature depolarization: Secondary | ICD-10-CM | POA: Diagnosis present

## 2015-09-11 DIAGNOSIS — E860 Dehydration: Secondary | ICD-10-CM | POA: Diagnosis present

## 2015-09-11 DIAGNOSIS — I252 Old myocardial infarction: Secondary | ICD-10-CM

## 2015-09-11 DIAGNOSIS — D649 Anemia, unspecified: Secondary | ICD-10-CM | POA: Diagnosis present

## 2015-09-11 DIAGNOSIS — D638 Anemia in other chronic diseases classified elsewhere: Secondary | ICD-10-CM | POA: Diagnosis present

## 2015-09-11 DIAGNOSIS — R410 Disorientation, unspecified: Secondary | ICD-10-CM | POA: Diagnosis not present

## 2015-09-11 DIAGNOSIS — I420 Dilated cardiomyopathy: Secondary | ICD-10-CM | POA: Diagnosis not present

## 2015-09-11 DIAGNOSIS — Z720 Tobacco use: Secondary | ICD-10-CM | POA: Diagnosis not present

## 2015-09-11 DIAGNOSIS — I5022 Chronic systolic (congestive) heart failure: Secondary | ICD-10-CM | POA: Diagnosis present

## 2015-09-11 DIAGNOSIS — I6523 Occlusion and stenosis of bilateral carotid arteries: Secondary | ICD-10-CM | POA: Diagnosis not present

## 2015-09-11 LAB — BASIC METABOLIC PANEL
Anion gap: 3 — ABNORMAL LOW (ref 5–15)
BUN: 36 mg/dL — AB (ref 6–20)
CALCIUM: 9 mg/dL (ref 8.9–10.3)
CHLORIDE: 111 mmol/L (ref 101–111)
CO2: 23 mmol/L (ref 22–32)
CREATININE: 2.84 mg/dL — AB (ref 0.61–1.24)
GFR calc non Af Amer: 21 mL/min — ABNORMAL LOW (ref >60)
GFR, EST AFRICAN AMERICAN: 25 mL/min — AB (ref >60)
Glucose, Bld: 171 mg/dL — ABNORMAL HIGH (ref 65–99)
Potassium: 5.1 mmol/L (ref 3.5–5.1)
SODIUM: 137 mmol/L (ref 135–145)

## 2015-09-11 LAB — CBC
HCT: 30.7 % — ABNORMAL LOW (ref 40.0–52.0)
HEMOGLOBIN: 9.6 g/dL — AB (ref 13.0–18.0)
MCH: 27.4 pg (ref 26.0–34.0)
MCHC: 31.3 g/dL — AB (ref 32.0–36.0)
MCV: 87.5 fL (ref 80.0–100.0)
PLATELETS: 187 10*3/uL (ref 150–440)
RBC: 3.51 MIL/uL — ABNORMAL LOW (ref 4.40–5.90)
RDW: 14.8 % — AB (ref 11.5–14.5)
WBC: 5.7 10*3/uL (ref 3.8–10.6)

## 2015-09-11 LAB — TROPONIN I: TROPONIN I: 0.05 ng/mL — AB (ref <0.031)

## 2015-09-11 MED ORDER — SODIUM CHLORIDE 0.9 % IV SOLN: INTRAVENOUS | Status: DC

## 2015-09-11 MED ORDER — SODIUM CHLORIDE 0.9 % IJ SOLN
3.0000 mL | Freq: Two times a day (BID) | INTRAMUSCULAR | Status: DC
  Administered 2015-09-11 – 2015-09-12 (×3): 3 mL via INTRAVENOUS

## 2015-09-11 MED ORDER — ISOSORBIDE MONONITRATE ER 30 MG PO TB24
30.0000 mg | ORAL_TABLET | Freq: Every day | ORAL | Status: DC
  Administered 2015-09-12: 30 mg via ORAL
  Filled 2015-09-11: qty 1

## 2015-09-11 MED ORDER — SODIUM CHLORIDE 0.9 % IV BOLUS (SEPSIS)
1000.0000 mL | Freq: Once | INTRAVENOUS | Status: AC
  Administered 2015-09-11: 1000 mL via INTRAVENOUS

## 2015-09-11 MED ORDER — DOCUSATE SODIUM 100 MG PO CAPS
100.0000 mg | ORAL_CAPSULE | Freq: Two times a day (BID) | ORAL | Status: DC
  Administered 2015-09-11 – 2015-09-13 (×5): 100 mg via ORAL
  Filled 2015-09-11 (×5): qty 1

## 2015-09-11 MED ORDER — ASPIRIN 81 MG PO CHEW
81.0000 mg | CHEWABLE_TABLET | Freq: Every day | ORAL | Status: DC
  Administered 2015-09-12 – 2015-09-13 (×2): 81 mg via ORAL
  Filled 2015-09-11 (×2): qty 1

## 2015-09-11 MED ORDER — CARVEDILOL 6.25 MG PO TABS
6.2500 mg | ORAL_TABLET | Freq: Two times a day (BID) | ORAL | Status: DC
  Administered 2015-09-11 – 2015-09-12 (×2): 6.25 mg via ORAL
  Filled 2015-09-11 (×2): qty 1

## 2015-09-11 MED ORDER — ONDANSETRON HCL 4 MG PO TABS: 4.0000 mg | ORAL_TABLET | Freq: Four times a day (QID) | ORAL | Status: DC | PRN

## 2015-09-11 MED ORDER — LISINOPRIL 10 MG PO TABS
10.0000 mg | ORAL_TABLET | Freq: Two times a day (BID) | ORAL | Status: DC
  Administered 2015-09-11 – 2015-09-12 (×3): 10 mg via ORAL
  Filled 2015-09-11 (×3): qty 1

## 2015-09-11 MED ORDER — NICOTINE 7 MG/24HR TD PT24
7.0000 mg | MEDICATED_PATCH | Freq: Every day | TRANSDERMAL | Status: DC
  Administered 2015-09-11 – 2015-09-13 (×3): 7 mg via TRANSDERMAL
  Filled 2015-09-11 (×4): qty 1

## 2015-09-11 MED ORDER — LISINOPRIL 10 MG PO TABS
10.0000 mg | ORAL_TABLET | Freq: Every day | ORAL | Status: DC
Start: 2015-09-11 — End: 2015-09-11

## 2015-09-11 MED ORDER — NITROGLYCERIN 2 % TD OINT
0.5000 [in_us] | TOPICAL_OINTMENT | Freq: Four times a day (QID) | TRANSDERMAL | Status: DC
  Administered 2015-09-11 – 2015-09-12 (×3): 0.5 [in_us] via TOPICAL
  Filled 2015-09-11 (×2): qty 1

## 2015-09-11 MED ORDER — HYDRALAZINE HCL 25 MG PO TABS
25.0000 mg | ORAL_TABLET | Freq: Four times a day (QID) | ORAL | Status: DC
  Administered 2015-09-11 – 2015-09-12 (×7): 25 mg via ORAL
  Filled 2015-09-11 (×7): qty 1

## 2015-09-11 MED ORDER — HEPARIN SODIUM (PORCINE) 5000 UNIT/ML IJ SOLN
5000.0000 [IU] | Freq: Three times a day (TID) | INTRAMUSCULAR | Status: DC
  Administered 2015-09-11 – 2015-09-13 (×7): 5000 [IU] via SUBCUTANEOUS
  Filled 2015-09-11 (×7): qty 1

## 2015-09-11 MED ORDER — MELATONIN 10 MG PO TABS: 10.0000 mg | ORAL_TABLET | Freq: Every day | ORAL | Status: DC

## 2015-09-11 MED ORDER — ONDANSETRON HCL 4 MG/2ML IJ SOLN: 4.0000 mg | Freq: Four times a day (QID) | INTRAMUSCULAR | Status: DC | PRN

## 2015-09-11 MED ORDER — ATORVASTATIN CALCIUM 10 MG PO TABS
10.0000 mg | ORAL_TABLET | Freq: Every day | ORAL | Status: DC
  Administered 2015-09-12 – 2015-09-13 (×2): 10 mg via ORAL
  Filled 2015-09-11 (×2): qty 1

## 2015-09-11 MED ORDER — NICOTINE 10 MG IN INHA: 1.0000 | RESPIRATORY_TRACT | Status: DC | PRN

## 2015-09-11 NOTE — ED Notes (Signed)
EMS states that pt is from [facility] and this morning he went outside to smoke and had a syncopal episode. EMS states that pt was lethargic upon their arrival. Pt is alert upon arrival to ER and denies pain at this time. Pt is oriented to person and place.

## 2015-09-11 NOTE — Progress Notes (Signed)
PHARMACIST - PHYSICIAN ORDER COMMUNICATION  CONCERNING: P&T Medication Policy on Herbal Medications  DESCRIPTION:  This patient's order for:  melatonin  has been noted.  This product(s) is classified as an "herbal" or natural product. Due to a lack of definitive safety studies or FDA approval, nonstandard manufacturing practices, plus the potential risk of unknown drug-drug interactions while on inpatient medications, the Pharmacy and Therapeutics Committee does not permit the use of "herbal" or natural products of this type within [facility].     Ramond Dial, Pharm.D Clinical Pharmacist

## 2015-09-11 NOTE — Progress Notes (Signed)
PT Cancellation Note  Patient Details Name: Kyle Short MRN: [id number] DOB: May 16, 1946   Cancelled Treatment:    Reason Eval/Treat Not Completed: Medical issues which prohibited therapy (Chart reviewed for attempted evaluation; patient noted with mild elevation in troponin status post syncopal episode.  Cards consult pending.  Will hold eval until patient medically cleared for activity.)   Kristen H. Owens Shark, PT, DPT, NCS 09/11/2015, 3:26 PM [phone]

## 2015-09-11 NOTE — Progress Notes (Signed)
Per MD Ether Griffins order a soft diet for patient

## 2015-09-11 NOTE — H&P (Signed)
[place] [facility] [place] NAME: Kyle Short    MR#:  [id number]  DATE OF BIRTH:  [birth year]  DATE OF ADMISSION:  09/11/2015  PRIMARY CARE PHYSICIAN: Leata Mouse, NP   REQUESTING/REFERRING PHYSICIAN:   CHIEF COMPLAINT:   Chief Complaint  Patient presents with  . Loss of Consciousness    HISTORY OF PRESENT ILLNESS: Kyle Short  is a 69 y.o. male with a known history of Alzheimer's dementia, cardiomyopathy, congestive heart failure who presents to the hospital with syncopal episode. Apparently patient ate  breakfast and went outside to smoke a cigarette. He just started smoking and suddenly became presyncopal and passed out. He thinks he passed out for a few minutes. He had no chest pains prior to fall and syncopal episode. No chest pain upon waking up, but he felt somewhat dizzy whenever he woke up. He was noted to have orthostatic hypotension in the emergency room with systolic blood pressure dropping down to 90s from 160s on standing. Labs were remarkable for chronic renal failure. Mild troponin elevation of 0.05, mild anemia. Urinalysis is pending.   PAST MEDICAL HISTORY:   Past Medical History  Diagnosis Date  . Hypertension   . Lives in group home   . Hyperlipidemia   . Stroke   . Alcohol abuse   . Chronic kidney disease (CKD) stage G3b/A1, moderately decreased glomerular filtration rate (GFR) between 30-44 mL/min/1.73 square meter and albuminuria creatinine ratio less than 30 mg/g   . Anemia of chronic disease   . Alzheimer's dementia with behavioral disturbance   . Cardiomyopathy due to hypertension, without heart failure   . Myocardial infarction     PAST SURGICAL HISTORY:  Past Surgical History  Procedure Laterality Date  . Tonsillectomy      SOCIAL HISTORY:  Social History  Substance Use Topics  . Smoking status: Current Every Day Smoker -- 1.00 packs/day    Types: Cigarettes  . Smokeless tobacco: Not on file   . Alcohol Use: No    FAMILY HISTORY:  Family History  Problem Relation Age of Onset  . Hypertension Other   . Hypertension Mother   . Diabetes Father     DRUG ALLERGIES: No Known Allergies Difficult evaluation as patient is demented Review of Systems  Constitutional: Negative for fever, chills, weight loss and malaise/fatigue.  HENT: Negative for congestion.   Eyes: Negative for blurred vision and double vision.  Respiratory: Positive for cough. Negative for sputum production, shortness of breath and wheezing.   Cardiovascular: Negative for chest pain, palpitations, orthopnea, leg swelling and PND.  Gastrointestinal: Negative for nausea, vomiting, abdominal pain, diarrhea, constipation, blood in stool and melena.  Genitourinary: Negative for dysuria, urgency, frequency and hematuria.  Musculoskeletal: Negative for falls.  Skin: Negative for rash.  Neurological: Negative for dizziness and weakness.  Psychiatric/Behavioral: Negative for depression and memory loss. The patient is not nervous/anxious.     MEDICATIONS AT HOME:  Prior to Admission medications   Medication Sig Start Date End Date Taking? Authorizing Provider  aspirin (ASPIRIN CHILDRENS) 81 MG chewable tablet Chew 1 tablet (81 mg total) by mouth daily. 08/23/15   Arlis Porta., MD  atorvastatin (LIPITOR) 10 MG tablet Take 1 tablet (10 mg total) by mouth daily. 08/20/15   Arlis Porta., MD  bismuth subsalicylate (PEPTO BISMOL) 262 MG chewable tablet Chew 262 mg by mouth daily as needed for indigestion or diarrhea or loose stools.    Historical  Provider, MD  carvedilol (COREG) 12.5 MG tablet Take 1 tablet (12.5 mg total) by mouth 2 (two) times daily. 08/21/15   Arlis Porta., MD  Cholecalciferol (HM VITAMIN D3) 2000 UNITS CAPS Take 2,000 Units by mouth daily.    Historical Provider, MD  hydrALAZINE (APRESOLINE) 25 MG tablet Take 1 tablet (25 mg total) by mouth 4 (four) times daily. 08/20/15   Arlis Porta.,  MD  isosorbide mononitrate (IMDUR) 30 MG 24 hr tablet Take 1 tablet (30 mg total) by mouth daily. 08/23/15   Arlis Porta., MD  lisinopril (PRINIVIL,ZESTRIL) 10 MG tablet Take 1 tablet (10 mg total) by mouth daily. 07/26/15   Amy Overton Mam, NP  Melatonin (RA MELATONIN) 10 MG TABS Take 10 mg by mouth at bedtime.    Historical Provider, MD  Melatonin 10 MG CAPS Take 10 mg by mouth at bedtime as needed. 08/23/15   Arlis Porta., MD      PHYSICAL EXAMINATION:   VITAL SIGNS: Blood pressure 161/70, pulse 60, temperature 97.3 F (36.3 C), temperature source Oral, resp. rate 19, height 6\' 1"  (1.854 m), weight 72.576 kg (160 lb), SpO2 98 %.  GENERAL:  69 y.o.-year-old patient lying in the bed with no acute distress.  EYES: Pupils equal, round, reactive to light and accommodation. No scleral icterus. Extraocular muscles intact.  HEENT: Head atraumatic, normocephalic. Oropharynx and nasopharynx clear.  NECK:  Supple, no jugular venous distention. No thyroid enlargement, no tenderness.  LUNGS: Normal breath sounds bilaterally, no wheezing, rales,rhonchi or crepitation. No use of accessory muscles of respiration.  CARDIOVASCULAR: S1, S2 normal. No murmurs, rubs, or gallops.  ABDOMEN: Soft, nontender, some tightness suprapubically,  nondistended. Bowel sounds present. No organomegaly or mass.  EXTREMITIES: No pedal edema, cyanosis, or clubbing.  NEUROLOGIC: Cranial nerves II through XII are intact. Muscle strength 5/5 in all extremities. Sensation intact. Gait not checked.  PSYCHIATRIC: The patient is alert and oriented x 3.  SKIN: No obvious rash, lesion, or ulcer.   LABORATORY PANEL:   CBC  Recent Labs Lab 09/11/15 1220  WBC 5.7  HGB 9.6*  HCT 30.7*  PLT 187  MCV 87.5  MCH 27.4  MCHC 31.3*  RDW 14.8*   ------------------------------------------------------------------------------------------------------------------  Chemistries   Recent Labs Lab 09/11/15 1220  NA 137  K  5.1  CL 111  CO2 23  GLUCOSE 171*  BUN 36*  CREATININE 2.84*  CALCIUM 9.0   ------------------------------------------------------------------------------------------------------------------  Cardiac Enzymes  Recent Labs Lab 09/11/15 1220  TROPONINI 0.05*   ------------------------------------------------------------------------------------------------------------------  RADIOLOGY: Dg Chest Port 1 View  09/11/2015   CLINICAL DATA:  Altered mental status.  Fainting spell.  Dementia.  EXAM: PORTABLE CHEST 1 VIEW  COMPARISON:  Radiographs 07/06/2015  FINDINGS: Normal cardiac silhouette. There is mild bibasilar atelectasis increased from prior. No effusion, infiltrate, pneumothorax.  IMPRESSION: Increased bibasilar atelectasis. No focal infiltrate or pneumothorax.   Electronically Signed   By: Suzy Bouchard M.D.   On: 09/11/2015 12:48    EKG: Orders placed or performed during the hospital encounter of 09/11/15  . EKG 12-Lead  . EKG 12-Lead   EKG in the emergency room 28th of September to 16 revealed nonspecific ST-T changes, normal sinus rhythm at 60 bpm  IMPRESSION AND PLAN:  Principal Problem:   Syncope Active Problems:   Orthostatic hypotension 1.  Syncope. Admit patient medical floor, get orthostatic vital signs. Intermittently, get carotid ultrasound and echocardiogram 2. orthostatichypotension. Continue IV fluids at 75 -100  cc an hour. Following systolic blood pressure readings upon standing 3. Elevated troponin , unclear etiology, possibly demand ischemia. Follow cardiac enzymes, N3, continue Coreg, aspirin therapy and nitroglycerin. Get cardiologist involved for further recommendations 4. Chronic renal failure. Follow patient's kidney function with IV fluid administration. Get urinalysis to rule out urinary tract infection 5 anemia , get Heme occult 6. Tobacco abuse. Discussed this patient for approximately 4 minutes. Nicotine replacement therapy will be initiated. He  is agreeable  All the records are reviewed and case discussed with ED provider. Management plans discussed with the patient, family and they are in agreement.  CODE STATUS: Full code   TOTAL TIME TAKING CARE OF THIS PATIENT: 55 minutes.    Theodoro Grist M.D on 09/11/2015 at 1:29 PM  Between 7am to 6pm - Pager - [phone] After 6pm go to [email or web address] - password [email or web address] [facility]  [facility]  Office  [phone]  CC: Primary care physician; Amy Annamary Rummage, NP

## 2015-09-11 NOTE — ED Provider Notes (Signed)
[facility] Emergency Department Provider Note REMINDER - THIS NOTE IS NOT A FINAL MEDICAL RECORD UNTIL IT IS SIGNED. UNTIL THEN, THE CONTENT BELOW MAY REFLECT INFORMATION FROM A DOCUMENTATION TEMPLATE, NOT THE ACTUAL PATIENT VISIT. ____________________________________________  Time seen: Approximately 12:23 PM  I have reviewed the triage vital signs and the nursing notes.   HISTORY  Chief Complaint Loss of Consciousness    HPI Kyle Short is a 69 y.o. male history of previous cardiomyopathy, previous MI, Alzheimer's who presents today after single episode. The patient is able to recall eating normally today, he went outside and was smoking a cigarette and the son when he got very weak and lightheaded and passed out. He denies injury. He awoke on the ground. No CPR was administered. EMS found him lethargic, he is transferred to ER. At the present time he feels much improved. He had no chest pain or trouble breathing, No palpitations,does report feeling weak and dizzy when he tries to stand. No numbness or tingling. No weakness in arm or leg. No trouble speaking.   Past Medical History  Diagnosis Date  . Hypertension   . Lives in group home   . Hyperlipidemia   . Stroke   . Alcohol abuse   . Chronic kidney disease (CKD) stage G3b/A1, moderately decreased glomerular filtration rate (GFR) between 30-44 mL/min/1.73 square meter and albuminuria creatinine ratio less than 30 mg/g   . Anemia of chronic disease   . Alzheimer's dementia with behavioral disturbance   . Cardiomyopathy due to hypertension, without heart failure   . Myocardial infarction     Patient Active Problem List   Diagnosis Date Noted  . Orthostatic hypotension 09/11/2015  . Syncope 09/11/2015  . Chronic kidney disease, stage 4, severely decreased GFR 07/26/2015  . Benign essential HTN 07/26/2015  . Type 2 diabetes mellitus with chronic kidney disease 07/25/2015  . H/O alcohol abuse  07/25/2015  . HLD (hyperlipidemia) 07/25/2015  . Cerebrovascular accident, old 07/25/2015  . Sepsis 07/06/2015  . UTI (lower urinary tract infection) 07/06/2015  . Acute on chronic renal failure 07/06/2015  . Dementia, Alzheimer's, with behavior disturbance 06/07/2015  . Congestive heart failure with cardiomyopathy 06/07/2015  . Renal insufficiency 06/07/2015  . Compulsive tobacco user syndrome 02/26/2015    Past Surgical History  Procedure Laterality Date  . Tonsillectomy      Current Outpatient Rx  Name  Route  Sig  Dispense  Refill  . aspirin (ASPIRIN CHILDRENS) 81 MG chewable tablet   Oral   Chew 1 tablet (81 mg total) by mouth daily.   30 tablet   0   . atorvastatin (LIPITOR) 10 MG tablet   Oral   Take 1 tablet (10 mg total) by mouth daily.   30 tablet   4   . bismuth subsalicylate (PEPTO BISMOL) 262 MG chewable tablet   Oral   Chew 262 mg by mouth daily as needed for indigestion or diarrhea or loose stools.         . carvedilol (COREG) 12.5 MG tablet   Oral   Take 1 tablet (12.5 mg total) by mouth 2 (two) times daily.   60 tablet   6   . Cholecalciferol (HM VITAMIN D3) 2000 UNITS CAPS   Oral   Take 2,000 Units by mouth daily.         . hydrALAZINE (APRESOLINE) 25 MG tablet   Oral   Take 1 tablet (25 mg total) by mouth 4 (four) times daily.  120 tablet   4   . isosorbide mononitrate (IMDUR) 30 MG 24 hr tablet   Oral   Take 1 tablet (30 mg total) by mouth daily.   30 tablet   0   . lisinopril (PRINIVIL,ZESTRIL) 10 MG tablet   Oral   Take 1 tablet (10 mg total) by mouth daily.   90 tablet   3   . Melatonin (RA MELATONIN) 10 MG TABS   Oral   Take 10 mg by mouth at bedtime.         . Melatonin 10 MG CAPS   Oral   Take 10 mg by mouth at bedtime as needed.   30 capsule   0     Allergies Review of patient's allergies indicates no known allergies.  Family History  Problem Relation Age of Onset  . Hypertension Other   . Hypertension  Mother   . Diabetes Father     Social History Social History  Substance Use Topics  . Smoking status: Current Every Day Smoker -- 1.00 packs/day    Types: Cigarettes  . Smokeless tobacco: None  . Alcohol Use: No    Review of Systems Constitutional: No fever/chills Eyes: No visual changes. ENT: No sore throat. Cardiovascular: Denies chest pain. Respiratory: Denies shortness of breath. Gastrointestinal: No abdominal pain.  No nausea, no vomiting.  No diarrhea.  No constipation. Genitourinary: Negative for dysuria. Musculoskeletal: Negative for back pain. Skin: Negative for rash. Neurological: Negative for headaches, focal weakness or numbness.  10-point ROS otherwise negative.  ____________________________________________   PHYSICAL EXAM:  VITAL SIGNS: ED Triage Vitals  Enc Vitals Group     BP 09/11/15 1200 131/84 mmHg     Pulse Rate 09/11/15 1200 61     Resp 09/11/15 1200 17     Temp 09/11/15 1200 97.3 F (36.3 C)     Temp Source 09/11/15 1200 Oral     SpO2 09/11/15 1200 100 %     Weight 09/11/15 1200 160 lb (72.576 kg)     Height 09/11/15 1200 6\' 1"  (1.854 m)     Head Cir --      Peak Flow --      Pain Score --      Pain Loc --      Pain Edu? --      Excl. in [place]? --    Constitutional: Alert and oriented. Well appearing and in no acute distress. Eyes: Conjunctivae are normal. PERRL. EOMI. Head: Atraumatic. Nose: No congestion/rhinnorhea. Mouth/Throat: Mucous membranes are moist.  Oropharynx non-erythematous. Neck: No stridor.  No cervical spine tenderness. Cardiovascular: Normal rate, regular rhythm. Grossly normal heart sounds.  Good peripheral circulation. Respiratory: Normal respiratory effort.  No retractions. Lungs CTAB. Gastrointestinal: Soft and nontender. No distention. No abdominal bruits. No CVA tenderness. Musculoskeletal: No lower extremity tenderness nor edema.  No joint effusions. Neurologic:  Normal speech and language. No gross focal  neurologic deficits are appreciated.  No pronator drift. No facial droop. 5 out of 5 strength all extremities Skin:  Skin is warm, dry and intact. No rash noted. Psychiatric: Mood and affect are normal. Speech and behavior are normal.  ____________________________________________   LABS (all labs ordered are listed, but only abnormal results are displayed)  Labs Reviewed  CBC - Abnormal; Notable for the following:    RBC 3.51 (*)    Hemoglobin 9.6 (*)    HCT 30.7 (*)    MCHC 31.3 (*)    RDW 14.8 (*)  All other components within normal limits  BASIC METABOLIC PANEL - Abnormal; Notable for the following:    Glucose, Bld 171 (*)    BUN 36 (*)    Creatinine, Ser 2.84 (*)    GFR calc non Af Amer 21 (*)    GFR calc Af Amer 25 (*)    Anion gap 3 (*)    All other components within normal limits  TROPONIN I - Abnormal; Notable for the following:    Troponin I 0.05 (*)    All other components within normal limits  URINALYSIS COMPLETEWITH MICROSCOPIC ([facility] ONLY)   ____________________________________________  EKG  Reviewed and interpreted by me EKG time 1210 Ventricular rate 60 QRS 80 QTc 4:30 Normal sinus rhythm with artifact in the baseline, but P waves are evident. The patient does have evidence of old anterior MI. There is nonspecific T-wave  seen in lateral leads. When compared with previous EKG does not appear there are any significant changes. ____________________________________________  RADIOLOGY  CLINICAL DATA: Altered mental status. Fainting spell. Dementia.  EXAM: PORTABLE CHEST 1 VIEW  COMPARISON: Radiographs 07/06/2015  FINDINGS: Normal cardiac silhouette. There is mild bibasilar atelectasis increased from prior. No effusion, infiltrate, pneumothorax.  IMPRESSION: Increased bibasilar atelectasis. No focal infiltrate or pneumothorax. ____________________________________________   PROCEDURES  Procedure(s) performed: None  Critical Care  performed: No  ____________________________________________   INITIAL IMPRESSION / ASSESSMENT AND PLAN / ED COURSE  Pertinent labs & imaging results that were available during my care of the patient were reviewed by me and considered in my medical decision making (see chart for details).  Patient is notably significant orthostatic and unable to stand without feeling near syncopal. He is awake and alert, is completely neurologically intact.  Syncope, currently recovering but still very orthostatic. We will hydrate him based on his orthostasis and continue to monitor closely. Check labs. EKG is reassuring.  ----------------------------------------- 1:09 PM on 09/11/2015 -----------------------------------------  Since labs reviewed and his hemoglobin is at approximately baseline, his creatinine is elevated but is actually improved from prior. Does have a history of urinary tract infections, and we'll check urinalysis. Because of his history of cardiomyopathy, inability to stand without feeling weak and needing to pass out today, we will admit the patient for syncope. ____________________________________________   FINAL CLINICAL IMPRESSION(S) / ED DIAGNOSES  Final diagnoses:  Syncope and collapse  Orthostatic lightheadedness      Delman Kitten, MD 09/11/15 1311

## 2015-09-11 NOTE — Clinical Social Work Note (Signed)
Clinical Social Work Assessment  Patient Details  Name: Kyle Short MRN: [id number] Date of Birth: 01/02/1946  Date of referral:  09/11/15               Reason for consult:  Facility Placement (pt is from a group home [facility])                Permission sought to share information with:  Family Supports, Customer service manager Permission granted to share information::  Yes, Verbal Permission Granted  Name::     Junious Dresser- facility owner [phone] and [place]  Agency::     Relationship::     Contact Information:     Housing/Transportation Living arrangements for the past 2 months:  [place] of Information:  Patient, Adult Children, Facility, Medical Team Patient Interpreter Needed:  None Criminal Activity/Legal Involvement Pertinent to Current Situation/Hospitalization:  No - Comment as needed Significant Relationships:  Adult Children Lives with:  Facility Resident Do you feel safe going back to the place where you live?  Yes Need for family participation in patient care:  Yes (Comment)  Care giving concerns:  No concerns expressed at this time   Facilities manager / plan:  CSW was able to speak to pt.  He was O to self and place.  He was able to tell CSW that he is from a [facility] in [place].  This was confirmed by facility owner Junious Dresser.  This was also confirmed by pt's daughter.  Per pt's daughter pt does not have any current spouse.  Per pt he has only been in this facility for an estimated 4 weeks.  CSW will confirm this time with facility.  Precious mobilitiy level was independent.  Facility will take pt back once medically stable.  Facility Fx# [id number].  CSW will continue to follow.  Employment status:  Disabled (Comment on whether or not currently receiving Disability) Insurance information:  Medicare, Medicaid In [place] PT Recommendations:    Information / Referral to community resources:      Patient/Family's Response to care:  Pt, facility, and pt's daughter were in agreement with DC back to [facility] one medically stable.  Patient/Family's Understanding of and Emotional Response to Diagnosis, Current Treatment, and Prognosis:  Pt verbalized his understanding as did facility owner and pt's daughter and were in agreement with DC.   Emotional Assessment Appearance:  Appears stated age Attitude/Demeanor/Rapport:   (polite friendly) Affect (typically observed):  Appropriate Orientation:  Oriented to Self, Oriented to Place Alcohol / Substance use:  Alcohol Use (Hx of alcohol abuse) Psych involvement (Current and /or in the community):  No (Comment)  Discharge Needs  Concerns to be addressed:  Care Coordination Readmission within the last 30 days:  No Current discharge risk:    Barriers to Discharge:  No Barriers Identified   Mathews Argyle, LCSW 09/11/2015, 3:49 PM

## 2015-09-11 NOTE — Care Management (Signed)
Patient  Admitted under observation for syncope. Presents to ED from [facility]  Informed CSW.

## 2015-09-11 NOTE — Progress Notes (Signed)
Per Md [place], hold NS infusion for elevated BP

## 2015-09-11 NOTE — Consult Note (Signed)
[facility] Cardiology  CARDIOLOGY CONSULT NOTE  Patient ID: Kyle Short MRN: [id number] DOB/AGE: 1946-12-06 69 y.o.  Admit date: 09/11/2015 Referring Physician Ether Griffins Primary Physician Fredia Sorrow NP Primary Cardiologist Jordan Hawks M.D. Reason for Consultation syncope  HPI: 69 year old gentleman with history of Alzheimer's dementia, nonischemic cardiomyopathy, chronic systolic congestive heart failure, referred for evaluation of syncope. Today, the patient apparently ate breakfast, smoked a cigarette and passed out. She denied chest pain or shortness of breath. He was brought to [facility] emergency room where he was noted be orthostatic, she'll labs notable for elevated BUN and creatinine, mild elevation of troponin in the absence of chest pain or new ischemic ECG changes. Patient has had a history of syncope, with prior negative workup with recent 48-hour Holter monitor 02/05/15 which revealed predominant normal sinus rhythm the mean heart rate of 70 bpm with rare premature ventricular contractions.  Review of systems complete and found to be negative unless listed above     Past Medical History  Diagnosis Date  . Hypertension   . Lives in group home   . Hyperlipidemia   . Stroke   . Alcohol abuse   . Chronic kidney disease (CKD) stage G3b/A1, moderately decreased glomerular filtration rate (GFR) between 30-44 mL/min/1.73 square meter and albuminuria creatinine ratio less than 30 mg/g   . Anemia of chronic disease   . Alzheimer's dementia with behavioral disturbance   . Cardiomyopathy due to hypertension, without heart failure   . Myocardial infarction     Past Surgical History  Procedure Laterality Date  . Tonsillectomy      Prescriptions prior to admission  Medication Sig Dispense Refill Last Dose  . aspirin (ASPIRIN CHILDRENS) 81 MG chewable tablet Chew 1 tablet (81 mg total) by mouth daily. 30 tablet 0 09/11/2015 at 0800  . atorvastatin (LIPITOR) 10 MG tablet Take 1 tablet (10 mg  total) by mouth daily. 30 tablet 4 09/11/2015 at 0800  . bismuth subsalicylate (PEPTO BISMOL) 262 MG chewable tablet Chew 262 mg by mouth daily as needed for indigestion or diarrhea or loose stools.   prn at prn  . carvedilol (COREG) 12.5 MG tablet Take 1 tablet (12.5 mg total) by mouth 2 (two) times daily. 60 tablet 6 09/11/2015 at 0800  . Cholecalciferol (HM VITAMIN D3) 2000 UNITS CAPS Take 2,000 Units by mouth daily.   09/11/2015 at 0800  . hydrALAZINE (APRESOLINE) 25 MG tablet Take 1 tablet (25 mg total) by mouth 4 (four) times daily. 120 tablet 4 09/11/2015 at 0800  . isosorbide mononitrate (IMDUR) 30 MG 24 hr tablet Take 1 tablet (30 mg total) by mouth daily. 30 tablet 0 09/11/2015 at 0800  . lisinopril (PRINIVIL,ZESTRIL) 10 MG tablet Take 1 tablet (10 mg total) by mouth daily. 90 tablet 3 09/11/2015 at 0800  . Melatonin 10 MG CAPS Take 10 mg by mouth at bedtime as needed. 30 capsule 0 09/10/2015 at [place] History  . Marital Status: Single    Spouse Name: N/A  . Number of Children: N/A  . Years of Education: N/A   Occupational History  . Not on file.   Social History Main Topics  . Smoking status: Current Every Day Smoker -- 1.00 packs/day    Types: Cigarettes  . Smokeless tobacco: Not on file  . Alcohol Use: No  . Drug Use: Not on file  . Sexual Activity: Not on file   Other Topics Concern  . Not on file  Social History Narrative    Family History  Problem Relation Age of Onset  . Hypertension Other   . Hypertension Mother   . Diabetes Father       Review of systems complete and found to be negative unless listed above      PHYSICAL EXAM  General: Well developed, well nourished, in no acute distress HEENT:  Normocephalic and atramatic Neck:  No JVD.  Lungs: Clear bilaterally to auscultation and percussion. Heart: HRRR . Normal S1 and S2 without gallops or murmurs.  Abdomen: Bowel sounds are positive, abdomen soft and non-tender  Msk:   Back normal, normal gait. Normal strength and tone for age. Extremities: No clubbing, cyanosis or edema.   Neuro: Alert and oriented X 3. Psych:  Good affect, responds appropriately  Labs:   Lab Results  Component Value Date   WBC 5.7 09/11/2015   HGB 9.6* 09/11/2015   HCT 30.7* 09/11/2015   MCV 87.5 09/11/2015   PLT 187 09/11/2015    Recent Labs Lab 09/11/15 1220  NA 137  K 5.1  CL 111  CO2 23  BUN 36*  CREATININE 2.84*  CALCIUM 9.0  GLUCOSE 171*   Lab Results  Component Value Date   TROPONINI 0.05* 09/11/2015    Lab Results  Component Value Date   CHOL 160 01/14/2013   CHOL 205* 05/28/2012   CHOL 172 05/01/2011   Lab Results  Component Value Date   HDL 102* 01/14/2013   HDL 121* 05/28/2012   HDL 71 05/01/2011   Lab Results  Component Value Date   LDLCALC 41 01/14/2013   LDLCALC 72 05/28/2012   LDLCALC  05/01/2011    74        Total Cholesterol/HDL:CHD Risk Coronary Heart Disease Risk Table                     Men   Women  1/2 Average Risk   3.4   3.3  Average Risk       5.0   4.4  2 X Average Risk   9.6   7.1  3 X Average Risk  23.4   11.0        Use the calculated Patient Ratio above and the CHD Risk Table to determine the patient's CHD Risk.        ATP III CLASSIFICATION (LDL):  <100     mg/dL   Optimal  100-129  mg/dL   Near or Above                    Optimal  130-159  mg/dL   Borderline  160-189  mg/dL   High  >190     mg/dL   Very High   Lab Results  Component Value Date   TRIG 86 01/14/2013   TRIG 59 05/28/2012   TRIG 134 05/01/2011   Lab Results  Component Value Date   CHOLHDL 2.4 05/01/2011   No results found for: LDLDIRECT    Radiology: Dg Chest Port 1 View  09/11/2015   CLINICAL DATA:  Altered mental status.  Fainting spell.  Dementia.  EXAM: PORTABLE CHEST 1 VIEW  COMPARISON:  Radiographs 07/06/2015  FINDINGS: Normal cardiac silhouette. There is mild bibasilar atelectasis increased from prior. No effusion, infiltrate,  pneumothorax.  IMPRESSION: Increased bibasilar atelectasis. No focal infiltrate or pneumothorax.   Electronically Signed   By: Suzy Bouchard M.D.   On: 09/11/2015 12:48    EKG: Normal sinus rhythm  ASSESSMENT AND PLAN:   1. Syncope, with atypical features, possibly due to dehydration, currently clinically and     hemodynamically stable 2. Known cardiomyopathy, chronic systolic congestive heart failure, only does not appear to be fluid     overloaded, minimally symptomatic 3. Baseline Alzheimer's dementia  Recommendations  1. Agree with overall current therapy 2. IV fluid repletion 3. Review 2-D echocardiogram 4. Patient does well overnight, consider discharge, with 30 day loop monitor  Signed: Breyonna Nault MD,PhD, [facility] 09/11/2015, 5:05 PM

## 2015-09-12 DIAGNOSIS — G9340 Encephalopathy, unspecified: Secondary | ICD-10-CM | POA: Diagnosis present

## 2015-09-12 DIAGNOSIS — I493 Ventricular premature depolarization: Secondary | ICD-10-CM | POA: Diagnosis present

## 2015-09-12 DIAGNOSIS — I252 Old myocardial infarction: Secondary | ICD-10-CM | POA: Diagnosis not present

## 2015-09-12 DIAGNOSIS — N390 Urinary tract infection, site not specified: Secondary | ICD-10-CM | POA: Diagnosis present

## 2015-09-12 DIAGNOSIS — I420 Dilated cardiomyopathy: Secondary | ICD-10-CM | POA: Diagnosis not present

## 2015-09-12 DIAGNOSIS — E119 Type 2 diabetes mellitus without complications: Secondary | ICD-10-CM | POA: Diagnosis present

## 2015-09-12 DIAGNOSIS — R749 Abnormal serum enzyme level, unspecified: Secondary | ICD-10-CM | POA: Diagnosis not present

## 2015-09-12 DIAGNOSIS — E860 Dehydration: Secondary | ICD-10-CM | POA: Diagnosis present

## 2015-09-12 DIAGNOSIS — N184 Chronic kidney disease, stage 4 (severe): Secondary | ICD-10-CM | POA: Diagnosis present

## 2015-09-12 DIAGNOSIS — I5022 Chronic systolic (congestive) heart failure: Secondary | ICD-10-CM | POA: Diagnosis present

## 2015-09-12 DIAGNOSIS — I10 Essential (primary) hypertension: Secondary | ICD-10-CM | POA: Diagnosis not present

## 2015-09-12 DIAGNOSIS — Z8673 Personal history of transient ischemic attack (TIA), and cerebral infarction without residual deficits: Secondary | ICD-10-CM | POA: Diagnosis not present

## 2015-09-12 DIAGNOSIS — D638 Anemia in other chronic diseases classified elsewhere: Secondary | ICD-10-CM | POA: Diagnosis present

## 2015-09-12 DIAGNOSIS — Z7982 Long term (current) use of aspirin: Secondary | ICD-10-CM | POA: Diagnosis not present

## 2015-09-12 DIAGNOSIS — R748 Abnormal levels of other serum enzymes: Secondary | ICD-10-CM | POA: Diagnosis present

## 2015-09-12 DIAGNOSIS — F0280 Dementia in other diseases classified elsewhere without behavioral disturbance: Secondary | ICD-10-CM | POA: Diagnosis present

## 2015-09-12 DIAGNOSIS — R55 Syncope and collapse: Secondary | ICD-10-CM | POA: Diagnosis present

## 2015-09-12 DIAGNOSIS — D649 Anemia, unspecified: Secondary | ICD-10-CM | POA: Diagnosis present

## 2015-09-12 DIAGNOSIS — G309 Alzheimer's disease, unspecified: Secondary | ICD-10-CM | POA: Diagnosis present

## 2015-09-12 DIAGNOSIS — I951 Orthostatic hypotension: Secondary | ICD-10-CM | POA: Diagnosis present

## 2015-09-12 DIAGNOSIS — I248 Other forms of acute ischemic heart disease: Secondary | ICD-10-CM | POA: Diagnosis present

## 2015-09-12 DIAGNOSIS — R7989 Other specified abnormal findings of blood chemistry: Secondary | ICD-10-CM | POA: Diagnosis not present

## 2015-09-12 DIAGNOSIS — F0281 Dementia in other diseases classified elsewhere with behavioral disturbance: Secondary | ICD-10-CM | POA: Diagnosis present

## 2015-09-12 DIAGNOSIS — Z79899 Other long term (current) drug therapy: Secondary | ICD-10-CM | POA: Diagnosis not present

## 2015-09-12 DIAGNOSIS — E785 Hyperlipidemia, unspecified: Secondary | ICD-10-CM | POA: Diagnosis present

## 2015-09-12 DIAGNOSIS — I119 Hypertensive heart disease without heart failure: Secondary | ICD-10-CM | POA: Diagnosis present

## 2015-09-12 DIAGNOSIS — F17210 Nicotine dependence, cigarettes, uncomplicated: Secondary | ICD-10-CM | POA: Diagnosis present

## 2015-09-12 DIAGNOSIS — B965 Pseudomonas (aeruginosa) (mallei) (pseudomallei) as the cause of diseases classified elsewhere: Secondary | ICD-10-CM | POA: Diagnosis present

## 2015-09-12 LAB — URINALYSIS COMPLETE WITH MICROSCOPIC (ARMC ONLY)
BILIRUBIN URINE: NEGATIVE
Glucose, UA: NEGATIVE mg/dL
KETONES UR: NEGATIVE mg/dL
NITRITE: NEGATIVE
PH: 8 (ref 5.0–8.0)
PROTEIN: NEGATIVE mg/dL
Specific Gravity, Urine: 1.012 (ref 1.005–1.030)

## 2015-09-12 LAB — CBC
HCT: 30.4 % — ABNORMAL LOW (ref 40.0–52.0)
Hemoglobin: 9.7 g/dL — ABNORMAL LOW (ref 13.0–18.0)
MCH: 27.4 pg (ref 26.0–34.0)
MCHC: 31.8 g/dL — AB (ref 32.0–36.0)
MCV: 86.1 fL (ref 80.0–100.0)
PLATELETS: 184 10*3/uL (ref 150–440)
RBC: 3.53 MIL/uL — ABNORMAL LOW (ref 4.40–5.90)
RDW: 14.1 % (ref 11.5–14.5)
WBC: 7.8 10*3/uL (ref 3.8–10.6)

## 2015-09-12 LAB — BASIC METABOLIC PANEL
Anion gap: 6 (ref 5–15)
BUN: 31 mg/dL — AB (ref 6–20)
CHLORIDE: 110 mmol/L (ref 101–111)
CO2: 22 mmol/L (ref 22–32)
CREATININE: 2.58 mg/dL — AB (ref 0.61–1.24)
Calcium: 8.8 mg/dL — ABNORMAL LOW (ref 8.9–10.3)
GFR calc Af Amer: 28 mL/min — ABNORMAL LOW (ref >60)
GFR calc non Af Amer: 24 mL/min — ABNORMAL LOW (ref >60)
GLUCOSE: 121 mg/dL — AB (ref 65–99)
Potassium: 4.1 mmol/L (ref 3.5–5.1)
SODIUM: 138 mmol/L (ref 135–145)

## 2015-09-12 MED ORDER — CARVEDILOL 12.5 MG PO TABS
12.5000 mg | ORAL_TABLET | Freq: Two times a day (BID) | ORAL | Status: DC
  Administered 2015-09-12: 12.5 mg via ORAL
  Filled 2015-09-12: qty 1

## 2015-09-12 MED ORDER — ISOSORBIDE MONONITRATE ER 60 MG PO TB24
60.0000 mg | ORAL_TABLET | Freq: Every day | ORAL | Status: DC
  Administered 2015-09-13: 60 mg via ORAL
  Filled 2015-09-12: qty 1

## 2015-09-12 MED ORDER — HYDRALAZINE HCL 20 MG/ML IJ SOLN
10.0000 mg | Freq: Four times a day (QID) | INTRAMUSCULAR | Status: DC | PRN
  Filled 2015-09-12: qty 1

## 2015-09-12 MED ORDER — LISINOPRIL 20 MG PO TABS
20.0000 mg | ORAL_TABLET | Freq: Two times a day (BID) | ORAL | Status: DC
  Administered 2015-09-13: 20 mg via ORAL
  Filled 2015-09-12: qty 1

## 2015-09-12 MED ORDER — ACETAMINOPHEN 325 MG PO TABS: 650.0000 mg | ORAL_TABLET | Freq: Four times a day (QID) | ORAL | Status: DC | PRN

## 2015-09-12 MED ORDER — ENSURE ENLIVE PO LIQD
237.0000 mL | Freq: Two times a day (BID) | ORAL | Status: DC
  Administered 2015-09-12 – 2015-09-13 (×3): 237 mL via ORAL

## 2015-09-12 MED ORDER — CIPROFLOXACIN IN D5W 200 MG/100ML IV SOLN
200.0000 mg | Freq: Two times a day (BID) | INTRAVENOUS | Status: DC
  Administered 2015-09-12: 200 mg via INTRAVENOUS
  Filled 2015-09-12 (×3): qty 100

## 2015-09-12 MED ORDER — CIPROFLOXACIN IN D5W 400 MG/200ML IV SOLN
400.0000 mg | INTRAVENOUS | Status: DC
  Administered 2015-09-13: 400 mg via INTRAVENOUS
  Filled 2015-09-12 (×2): qty 200

## 2015-09-12 MED ORDER — HYDRALAZINE HCL 50 MG PO TABS
50.0000 mg | ORAL_TABLET | Freq: Four times a day (QID) | ORAL | Status: DC
  Administered 2015-09-13 (×2): 50 mg via ORAL
  Filled 2015-09-12 (×2): qty 1

## 2015-09-12 MED ORDER — CARVEDILOL 25 MG PO TABS
25.0000 mg | ORAL_TABLET | Freq: Two times a day (BID) | ORAL | Status: DC
  Administered 2015-09-13: 25 mg via ORAL
  Filled 2015-09-12: qty 1

## 2015-09-12 MED ORDER — DEXTROSE 5 % IV SOLN
1.0000 g | INTRAVENOUS | Status: DC
  Administered 2015-09-12: 1 g via INTRAVENOUS
  Filled 2015-09-12 (×2): qty 10

## 2015-09-12 MED ORDER — SODIUM CHLORIDE 0.9 % IV SOLN
INTRAVENOUS | Status: DC
  Administered 2015-09-12: 14:00:00 via INTRAVENOUS

## 2015-09-12 NOTE — Progress Notes (Signed)
Dr Reece Levy made aware of Temp 100.7 and Blood Pressure.  No further orders at this time.

## 2015-09-12 NOTE — Care Management (Signed)
Physical therapy is recommending home health physical therapy at discharge.  Patient may also benefit form home health nurse for skilled observation/assessment.  No agency preference.  Referral called to [facility] for SN and PT

## 2015-09-12 NOTE — Evaluation (Signed)
Physical Therapy Evaluation Patient Details Name: Kyle Short MRN: [id number] DOB: 07-Jul-1946 Today's Date: 09/12/2015   History of Present Illness  Pt comes into hospital with reports of dizziness and presented with altered mental status. Dx is sepsis from UTI.   Clinical Impression  Pt is a 69yo black male who presents after dizziness and syncopal episode at home. Pt demonstrating moderate weakness as evident in assessment of functional mobility, requiring Min assist to perform bed mobility, transfers, and ambulation. Pt is at high risk for falling as related to significant gait impairments, as well as Berg Balance Test score of 22/56. Pt denies pain, and light touch sensation appears intact. Pt is alert, but hypoactive with some flat affect. Pt is oriented x3. Pt will benefit from skilled intervention to address the above impairments and to help patient return to prior functional level. PT recommending DC and return to home, with HHPT services.     Follow Up Recommendations Home health PT    Equipment Recommendations  None recommended by PT (Pt reports he has a RW at home. )    Recommendations for Other Services       Precautions / Restrictions Precautions Precautions: Fall Restrictions Weight Bearing Restrictions: No      Mobility  Bed Mobility Overal bed mobility: Needs Assistance Bed Mobility: Supine to Sit;Sit to Supine     Supine to sit: Min assist Sit to supine: Min assist   General bed mobility comments: Moderate trunk weakness, bradykinesia   Transfers Overall transfer level: Needs assistance Equipment used: None;1 person hand held assist Transfers: Sit to/from Stand Sit to Stand: Min assist            Ambulation/Gait Ambulation/Gait assistance: Min assist Ambulation Distance (Feet): 100 Feet Assistive device: None     Gait velocity interpretation: <1.8 ft/sec, indicative of risk for recurrent falls General Gait Details: Pt scissoring, with  variable step length, mostly step-to gait, with decreased step length on L typical of R hip weakness. Pt staggering R/L, only minimal LOB, able to self correct most until attending to trun 180 degrees with LOB requiring minA from PT to correct.   Stairs            Wheelchair Mobility    Modified Rankin (Stroke Patients Only)       Balance Overall balance assessment: Needs assistance;History of Falls (1 fall in last 6 months related to this hospitalization. ) Sitting-balance support: Single extremity supported Sitting balance-Leahy Scale: Fair Sitting balance - Comments: poor dynamic excursion; poor static posture.  Postural control: Posterior lean Standing balance support: Single extremity supported Standing balance-Leahy Scale: Poor                   Standardized Balance Assessment Standardized Balance Assessment : Berg Balance Test Berg Balance Test Sit to Stand: Needs minimal aid to stand or to stabilize Standing Unsupported: Able to stand 2 minutes with supervision Sitting with Back Unsupported but Feet Supported on Floor or Stool: Able to sit 2 minutes under supervision Stand to Sit: Sits independently, has uncontrolled descent Transfers: Needs one person to assist Standing Unsupported with Eyes Closed: Able to stand 10 seconds with supervision Standing Ubsupported with Feet Together: Able to place feet together independently but unable to hold for 30 seconds From Standing, Reach Forward with Outstretched Arm: Can reach forward >5 cm safely (2") From Standing Position, Pick up Object from Floor: Unable to pick up shoe, but reaches 2-5 cm (1-2") from shoe and balances independently  From Standing Position, Turn to Look Behind Over each Shoulder: Turn sideways only but maintains balance Turn 360 Degrees: Needs assistance while turning Standing Unsupported, Alternately Place Feet on Step/Stool: Able to complete >2 steps/needs minimal assist Standing Unsupported, One  Foot in Front: Loses balance while stepping or standing Standing on One Leg: Tries to lift leg/unable to hold 3 seconds but remains standing independently Total Score: 22         Pertinent Vitals/Pain Pain Assessment: No/denies pain    Home Living Family/patient expects to be discharged to:: Group home                      Prior Function Level of Independence: Needs assistance      ADL's / Homemaking Assistance Needed: [place] help with meals. light house work, otherwise indep.        Hand Dominance        Extremity/Trunk Assessment               Lower Extremity Assessment: Generalized weakness (requires minimal assistance with bed mobility, standing, and walking)      Cervical / Trunk Assessment: Kyphotic  Communication   Communication: No difficulties  Cognition Arousal/Alertness: Lethargic;Awake/alert (A&Ox3) Behavior During Therapy: Flat affect;WFL for tasks assessed/performed (somewhat hypoactive, does nto make eye contact) Overall Cognitive Status: History of cognitive impairments - at baseline                      General Comments      Exercises        Assessment/Plan    PT Assessment Patient needs continued PT services  PT Diagnosis Difficulty walking;Abnormality of gait;Generalized weakness   PT Problem List Decreased strength;Decreased activity tolerance;Decreased balance;Decreased mobility;Decreased safety awareness;Decreased knowledge of use of DME  PT Treatment Interventions DME instruction;Gait training;Functional mobility training;Therapeutic activities;Therapeutic exercise;Balance training   PT Goals (Current goals can be found in the Care Plan section) Acute Rehab PT Goals Patient Stated Goal: Return to home, recover strength and indep.  PT Goal Formulation: With patient Time For Goal Achievement: 09/26/15 Potential to Achieve Goals: Fair    Frequency Min 2X/week   Barriers to discharge        Co-evaluation                End of Session Equipment Utilized During Treatment: Gait belt Activity Tolerance: Patient tolerated treatment well;No increased pain;Patient limited by lethargy Patient left: in bed;with call bell/phone within reach;with bed alarm set      Functional Assessment Tool Used: Berg Balance Test  Functional Limitation: Changing and maintaining body position Changing and Maintaining Body Position Current Status [id number]): At least 60 percent but less than 80 percent impaired, limited or restricted Changing and Maintaining Body Position Goal Status [id number]): At least 20 percent but less than 40 percent impaired, limited or restricted    Time: 1006-1026 PT Time Calculation (min) (ACUTE ONLY): 20 min   Charges:         PT G Codes:   PT G-Codes **NOT FOR INPATIENT CLASS** Functional Assessment Tool Used: Berg Balance Test  Functional Limitation: Changing and maintaining body position Changing and Maintaining Body Position Current Status [id number]): At least 60 percent but less than 80 percent impaired, limited or restricted Changing and Maintaining Body Position Goal Status [id number]): At least 20 percent but less than 40 percent impaired, limited or restricted    Buccola,Allan C 09/12/2015, 10:46 AM  10:49 AM  Etta Grandchild, PT,  DPT  License # [id number]

## 2015-09-12 NOTE — Plan of Care (Signed)
Problem: Acute Rehab PT Goals(only PT should resolve) Goal: Patient Will Transfer Sit To/From Stand Pt will transfer sit to/from-stand with RW at [employer] without loss-of-balance to demonstrate good safety awareness for independent mobility in home.      Goal: Pt Will Ambulate Pt will ambulate with RW at Supervision using a step-through pattern and equal step length for a distances greater than 290ft to demonstrate the ability to perform safe household distance ambulation at discharge.    Goal: PT Additional Goal #1 Pt will demonstrate improved balance as evident in improved Berg Balance Test score >40/56 to demonstrate moderate decrease in risk of falls.

## 2015-09-12 NOTE — Progress Notes (Signed)
[facility] Card[facility]  SUBJECTIVE: I feel better   Filed Vitals:   09/11/15 1655 09/11/15 2023 09/12/15 0445 09/12/15 0810  BP: 168/97 169/91 176/98 180/96  Pulse: 80 76 85 72  Temp:   100.7 F (38.2 C) 100.9 F (38.3 C)  TempSrc:   Oral Oral  Resp: 18 18 18 18   Height:      Weight:      SpO2:  100% 99% 96%     Intake/Output Summary (Last 24 hours) at 09/12/15 0843 Last data filed at 09/12/15 0447  Gross per 24 hour  Intake    240 ml  Output    100 ml  Net    140 ml      PHYSICAL EXAM  General: Well developed, well nourished, in no acute distress HEENT:  Normocephalic and atramatic Neck:  No JVD.  Lungs: Clear bilaterally to auscultation and percussion. Heart: HRRR . Normal S1 and S2 without gallops or murmurs.  Abdomen: Bowel sounds are positive, abdomen soft and non-tender  Msk:  Back normal, normal gait. Normal strength and tone for age. Extremities: No clubbing, cyanosis or edema.   Neuro: Alert and oriented X 3. Psych:  Good affect, responds appropriately   LABS: Basic Metabolic Panel:  Recent Labs  09/11/15 1220 09/12/15 0354  NA 137 138  K 5.1 4.1  CL 111 110  CO2 23 22  GLUCOSE 171* 121*  BUN 36* 31*  CREATININE 2.84* 2.58*  CALCIUM 9.0 8.8*   Liver Function Tests: No results for input(s): AST, ALT, ALKPHOS, BILITOT, PROT, ALBUMIN in the last 72 hours. No results for input(s): LIPASE, AMYLASE in the last 72 hours. CBC:  Recent Labs  09/11/15 1220 09/12/15 0354  WBC 5.7 7.8  HGB 9.6* 9.7*  HCT 30.7* 30.4*  MCV 87.5 86.1  PLT 187 184   Cardiac Enzymes:  Recent Labs  09/11/15 1220  TROPONINI 0.05*   BNP: Invalid input(s): POCBNP D-Dimer: No results for input(s): DDIMER in the last 72 hours. Hemoglobin A1C: No results for input(s): HGBA1C in the last 72 hours. Fasting Lipid Panel: No results for input(s): CHOL, HDL, LDLCALC, TRIG, CHOLHDL, LDLDIRECT in the last 72 hours. Thyroid Function Tests: No results for input(s): TSH,  T4TOTAL, T3FREE, THYROIDAB in the last 72 hours.  Invalid input(s): FREET3 Anemia Panel: No results for input(s): VITAMINB12, FOLATE, FERRITIN, TIBC, IRON, RETICCTPCT in the last 72 hours.  US Carotid Bilateral  09/11/2015   CLINICAL DATA:  Stroke, syncope, hypertension, visual spots, diabetes, previous tobacco abuse  EXAM: BILATERAL CAROTID DUPLEX ULTRASOUND  TECHNIQUE: Pearline Cables scale imaging, color Doppler and duplex ultrasound was performed of bilateral carotid and vertebral arteries in the neck.  COMPARISON:  03/01/2015  REVIEW OF SYSTEMS: Quantification of carotid stenosis is based on velocity parameters that correlate the residual internal carotid diameter with NASCET-based stenosis levels, using the diameter of the distal internal carotid lumen as the denominator for stenosis measurement.  The following velocity measurements were obtained:  PEAK SYSTOLIC/END DIASTOLIC  RIGHT  ICA:                     58/21cm/sec  CCA:                     123456  SYSTOLIC ICA/CCA RATIO:  0.9  DIASTOLIC ICA/CCA RATIO: 1.7  ECA:                     85cm/sec  LEFT  ICA:  7,119cm/sec  CCA:                     [id number]  SYSTOLIC ICA/CCA RATIO:  0.9  DIASTOLIC ICA/CCA RATIO: 1.5  ECA:                     84cm/sec  FINDINGS: RIGHT CAROTID ARTERY: Complex partially calcified plaque in the carotid bulb and at the ICA origin, resulting in at least mild stenosis. Normal waveforms and color Doppler signal however.  RIGHT VERTEBRAL ARTERY:  Normal flow direction and waveform.  LEFT CAROTID ARTERY: Partially calcified irregular plaque in the bulb and in the proximal ICA. No high-grade stenosis. Normal waveforms and color Doppler signal.  LEFT VERTEBRAL ARTERY: Normal flow direction and waveform.  1. Bilateral carotid bifurcation and proximal ICA plaque, resulting in less than 50% diameter stenosis. The exam does not exclude plaque ulceration or embolization. Continued surveillance recommended.   Electronically  Signed   By: Lucrezia Europe M.D.   On: 09/11/2015 17:04   Dg Chest Port 1 View  09/11/2015   CLINICAL DATA:  Altered mental status.  Fainting spell.  Dementia.  EXAM: PORTABLE CHEST 1 VIEW  COMPARISON:  Radiographs 07/06/2015  FINDINGS: Normal cardiac silhouette. There is mild bibasilar atelectasis increased from prior. No effusion, infiltrate, pneumothorax.  IMPRESSION: Increased bibasilar atelectasis. No focal infiltrate or pneumothorax.   Electronically Signed   By: Suzy Bouchard M.D.   On: 09/11/2015 12:48     Echo normal left ventricular function  TELEMETRY: Normal sinus rhythm:  ASSESSMENT AND PLAN:  Principal Problem:   Syncope Active Problems:   Orthostatic hypotension    1. Syncope, likely due to dehydration, doubt evidence for significant arrhythmia, with normal left ventricular function 2. Borderline elevated troponin, likely demand supply ischemia, not due to acute coronary syndrome  Recommendations  1. Continue current therapy 2. Consider discharge with 30 day loop monitor  Sign off for now, please call if any questions   PARASCHOS,ALEXANDER, MD, PhD, [facility] 09/12/2015 8:43 AM

## 2015-09-12 NOTE — Clinical Documentation Improvement (Signed)
Internal Medicine  CXR 09/11/15 notes "increased bibasilar atelectasis".  Please document in your future progress note and discharge summary if you agree with atelectasis as documented by radiologist.     Atelectasis  Other  Clinically Undetermined  Please exercise your independent, professional judgment when responding. A specific answer is not anticipated or expected.   Thank you, Mateo Flow, RN [phone] Clinical Documentation Specialist

## 2015-09-12 NOTE — Progress Notes (Signed)
[place] [facility] [place] NAME: Ronni Derksen    MR#:  [id number]  DATE OF BIRTH:  November 13, 1946  SUBJECTIVE:  CHIEF COMPLAINT:   Chief Complaint  Patient presents with  . Loss of Consciousness   admitted for syncope. Has had febrile episodes earlier today. Urinalysis showed UTI. No other concerns.  REVIEW OF SYSTEMS:    Review of Systems  Unable to perform ROS: mental acuity    DRUG ALLERGIES:  No Known Allergies  VITALS:  Blood pressure 157/97, pulse 72, temperature 99.8 F (37.7 C), temperature source Oral, resp. rate 18, height 6\' 1"  (1.854 m), weight 63.186 kg (139 lb 4.8 oz), SpO2 98 %.  PHYSICAL EXAMINATION:   Physical Exam  GENERAL:  69 y.o.-year-old patient lying in the bed with no acute distress.  EYES: Pupils equal, round, reactive to light and accommodation. No scleral icterus. Extraocular muscles intact.  HEENT: Head atraumatic, normocephalic. Oropharynx and nasopharynx clear.  NECK:  Supple, no jugular venous distention. No thyroid enlargement, no tenderness.  LUNGS: Normal breath sounds bilaterally, no wheezing, rales, rhonchi. No use of accessory muscles of respiration.  CARDIOVASCULAR: S1, S2 normal. No murmurs, rubs, or gallops.  ABDOMEN: Soft, nontender, nondistended. Bowel sounds present. No organomegaly or mass.  EXTREMITIES: No cyanosis, clubbing or edema b/l.    NEUROLOGIC: Cranial nerves II through XII are intact. No focal Motor or sensory deficits b/l.   PSYCHIATRIC: The patient is [place] SKIN: No obvious rash, lesion, or ulcer.    LABORATORY PANEL:   CBC  Recent Labs Lab 09/12/15 0354  WBC 7.8  HGB 9.7*  HCT 30.4*  PLT 184   ------------------------------------------------------------------------------------------------------------------  Chemistries   Recent Labs Lab 09/12/15 0354  NA 138  K 4.1  CL 110  CO2 22  GLUCOSE 121*  BUN 31*  CREATININE 2.58*  CALCIUM 8.8*    ------------------------------------------------------------------------------------------------------------------  Cardiac Enzymes  Recent Labs Lab 09/11/15 1220  TROPONINI 0.05*   ------------------------------------------------------------------------------------------------------------------  RADIOLOGY:  US Carotid Bilateral  09/11/2015   CLINICAL DATA:  Stroke, syncope, hypertension, visual spots, diabetes, previous tobacco abuse  EXAM: BILATERAL CAROTID DUPLEX ULTRASOUND  TECHNIQUE: Pearline Cables scale imaging, color Doppler and duplex ultrasound was performed of bilateral carotid and vertebral arteries in the neck.  COMPARISON:  03/01/2015  REVIEW OF SYSTEMS: Quantification of carotid stenosis is based on velocity parameters that correlate the residual internal carotid diameter with NASCET-based stenosis levels, using the diameter of the distal internal carotid lumen as the denominator for stenosis measurement.  The following velocity measurements were obtained:  PEAK SYSTOLIC/END DIASTOLIC  RIGHT  ICA:                     58/21cm/sec  CCA:                     123456  SYSTOLIC ICA/CCA RATIO:  0.9  DIASTOLIC ICA/CCA RATIO: 1.7  ECA:                     85cm/sec  LEFT  ICA:                     7,119cm/sec  CCA:                     [id number]  SYSTOLIC ICA/CCA RATIO:  0.9  DIASTOLIC ICA/CCA RATIO: 1.5  ECA:  84cm/sec  FINDINGS: RIGHT CAROTID ARTERY: Complex partially calcified plaque in the carotid bulb and at the ICA origin, resulting in at least mild stenosis. Normal waveforms and color Doppler signal however.  RIGHT VERTEBRAL ARTERY:  Normal flow direction and waveform.  LEFT CAROTID ARTERY: Partially calcified irregular plaque in the bulb and in the proximal ICA. No high-grade stenosis. Normal waveforms and color Doppler signal.  LEFT VERTEBRAL ARTERY: Normal flow direction and waveform.  1. Bilateral carotid bifurcation and proximal ICA plaque, resulting in less than 50%  diameter stenosis. The exam does not exclude plaque ulceration or embolization. Continued surveillance recommended.   Electronically Signed   By: Lucrezia Europe M.D.   On: 09/11/2015 17:04   Dg Chest Port 1 View  09/11/2015   CLINICAL DATA:  Altered mental status.  Fainting spell.  Dementia.  EXAM: PORTABLE CHEST 1 VIEW  COMPARISON:  Radiographs 07/06/2015  FINDINGS: Normal cardiac silhouette. There is mild bibasilar atelectasis increased from prior. No effusion, infiltrate, pneumothorax.  IMPRESSION: Increased bibasilar atelectasis. No focal infiltrate or pneumothorax.   Electronically Signed   By: Suzy Bouchard M.D.   On: 09/11/2015 12:48     ASSESSMENT AND PLAN:   * UTI with  acute encephalopathy. Multiple febrile episodes. Last urine cultures had Pseudomonas which is sensitive to ciprofloxacin which I have started through IV. We'll send for urine cultures. This seems to be the most likely cause of his syncope at this time. Dehydration contribution to this. We'll wait for final culture results.  * Hypertension, uncontrolled Improved after restarting his home medications.  * Mild elevation in troponin likely from demand ischemia. In setting of CKD.  * CKD stage III is stable  * Tobacco abuse  * Anemia of chronic disease is stable     All the records are reviewed and case discussed with Care Management/Social Workerr. Management plans discussed with the patient, family and they are in agreement.  CODE STATUS: Full code  DVT Prophylaxis: SCDs  TOTAL TIME TAKING CARE OF THIS PATIENT: 35 minutes.   POSSIBLE D/C IN 2-3 DAYS, DEPENDING ON CLINICAL CONDITION.   Hillary Bow R M.D on 09/12/2015 at 1:55 PM  Between 7am to 6pm - Pager - [phone]  After 6pm go to [email or web address] - password [email or web address] [facility]  [place] [facility]  Office  [phone]  CC: Primary care physician; Amy L Krebs, NP    Note: This dictation was prepared with Dragon dictation along with  smaller phrase technology. Any transcriptional errors that result from this process are unintentional.

## 2015-09-12 NOTE — Progress Notes (Signed)
Initial Nutrition Assessment     INTERVENTION:   Meals and Snacks: Cater to patient preferences Medical Food Supplement Therapy: recommend addition of Ensure Enlive po BID, each supplement provides 350 kcal and 20 grams of protein, to maximize calories due to pt reported fair appetite at present   NUTRITION DIAGNOSIS:   Inadequate oral intake related to poor appetite as evidenced by per patient/family report.  GOAL:   Patient will meet greater than or equal to 90% of their needs   MONITOR:    (Energy Intake, Anthroponmetrics, Electrolyte/Renal Profle)  REASON FOR ASSESSMENT:   Consult Assessment of nutrition requirement/status  ASSESSMENT:    Pt admitted with acute encephalopathy with UTI  Past Medical History  Diagnosis Date  . Hypertension   . Lives in group home   . Hyperlipidemia   . Stroke   . Alcohol abuse   . Chronic kidney disease (CKD) stage G3b/A1, moderately decreased glomerular filtration rate (GFR) between 30-44 mL/min/1.73 square meter and albuminuria creatinine ratio less than 30 mg/g   . Anemia of chronic disease   . Alzheimer's dementia with behavioral disturbance   . Cardiomyopathy due to hypertension, without heart failure   . Myocardial infarction     Diet Order:  DIET SOFT Room service appropriate?: Yes; Fluid consistency:: Thin   Energy Intake: recorded po intake 20% at lunch, 90% at breakfast. Pt reports appetite is fair  Food and Nutrition Related History: pt reports appetite has been good prior to admission; eating 3 good meals per day  Nutrition Focused Physical Exam:  Nutrition-Focused physical exam completed. Findings are WDL for fat depletion, mild muscle depletion, and no edema.   Meds: NS at 50 ml/hr  Height:   Ht Readings from Last 1 Encounters:  09/11/15 6\' 1"  (1.854 m)    Weight: pt believes his UBW is 160 pounds but unable to tell if he has lost any weight; noted pt with weight of 170 pounds on 8/15 but weight of 140  pounds 3 days earlier. Possible that some weights in weight encounters are stated weights.   Wt Readings from Last 1 Encounters:  09/11/15 139 lb 4.8 oz (63.186 kg)   Wt Readings from Last 10 Encounters:  09/11/15 139 lb 4.8 oz (63.186 kg)  07/29/15 170 lb (77.111 kg)  07/26/15 140 lb 9.6 oz (63.776 kg)  07/06/15 138 lb 6.4 oz (62.778 kg)  06/06/15 160 lb (72.576 kg)    BMI:  Body mass index is 18.38 kg/(m^2).  LOW Care Level  Kerman Passey MS, [place], LDN [phone] Pager

## 2015-09-13 LAB — BASIC METABOLIC PANEL
Anion gap: 8 (ref 5–15)
BUN: 31 mg/dL — AB (ref 6–20)
CALCIUM: 8.9 mg/dL (ref 8.9–10.3)
CHLORIDE: 108 mmol/L (ref 101–111)
CO2: 19 mmol/L — ABNORMAL LOW (ref 22–32)
CREATININE: 2.21 mg/dL — AB (ref 0.61–1.24)
GFR, EST AFRICAN AMERICAN: 34 mL/min — AB (ref >60)
GFR, EST NON AFRICAN AMERICAN: 29 mL/min — AB (ref >60)
Glucose, Bld: 105 mg/dL — ABNORMAL HIGH (ref 65–99)
Potassium: 4.3 mmol/L (ref 3.5–5.1)
SODIUM: 135 mmol/L (ref 135–145)

## 2015-09-13 MED ORDER — CIPROFLOXACIN HCL 250 MG PO TABS: 250.0000 mg | ORAL_TABLET | Freq: Two times a day (BID) | ORAL | Status: DC

## 2015-09-13 MED ORDER — CARVEDILOL 25 MG PO TABS: 25.0000 mg | ORAL_TABLET | Freq: Two times a day (BID) | ORAL | Status: DC

## 2015-09-13 NOTE — Clinical Social Work Note (Signed)
DC summary received.  Medications listed on DC summary faxed to facility owner.  MD signature needed on FL2 and Rx left in pt's chart.

## 2015-09-13 NOTE — Progress Notes (Signed)
Pt is alert with periods of confusion, VSS, NSR with a 1st degree HB on tele, no complaints of pain or discomfort. Order to discharge pt back to group home, report called to facility and they are on the way to pick up pt. IV and tele removed and pt awaiting facility representative for D/C.

## 2015-09-13 NOTE — Discharge Summary (Signed)
[place] [facility] [place] NAME: Kyle Short    MR#:  [id number]  DATE OF BIRTH:  1946/01/25  DATE OF ADMISSION:  09/11/2015 ADMITTING PHYSICIAN: Theodoro Grist, MD  DATE OF DISCHARGE: No discharge date for patient encounter.  PRIMARY CARE PHYSICIAN: Amy L Krebs, NP    ADMISSION DIAGNOSIS:  Syncope and collapse [R55] Syncope [R55] CVA (cerebral infarction) [I63.9] Orthostatic lightheadedness [R42]  DISCHARGE DIAGNOSIS:  Principal Problem:   Syncope Active Problems:   Orthostatic hypotension   UTI (urinary tract infection)   SECONDARY DIAGNOSIS:   Past Medical History  Diagnosis Date  . Hypertension   . Lives in group home   . Hyperlipidemia   . Stroke   . Alcohol abuse   . Chronic kidney disease (CKD) stage G3b/A1, moderately decreased glomerular filtration rate (GFR) between 30-44 mL/min/1.73 square meter and albuminuria creatinine ratio less than 30 mg/g   . Anemia of chronic disease   . Alzheimer's dementia with behavioral disturbance   . Cardiomyopathy due to hypertension, without heart failure   . Myocardial infarction      ADMITTING HISTORY  HISTORY OF PRESENT ILLNESS: Kyle Short is a 69 y.o. male with a known history of Alzheimer's dementia, cardiomyopathy, congestive heart failure who presents to the hospital with syncopal episode. Apparently patient ate breakfast and went outside to smoke a cigarette. He just started smoking and suddenly became presyncopal and passed out. He thinks he passed out for a few minutes. He had no chest pains prior to fall and syncopal episode. No chest pain upon waking up, but he felt somewhat dizzy whenever he woke up. He was noted to have orthostatic hypotension in the emergency room with systolic blood pressure dropping down to 90s from 160s on standing. Labs were remarkable for chronic renal failure. Mild troponin elevation of 0.05, mild anemia. Urinalysis is pending.     HOSPITAL COURSE:   * UTI with acute encephalopathy. Multiple febrile episodes. Last urine cultures had Pseudomonas which is sensitive to ciprofloxacin which I have started through IV. Change to PO at discharge  * Hypertension, uncontrolled Improved after restarting his home medications.  * Mild elevation in troponin likely from demand ischemia. In setting of CKD. Seen by cardiology Needs f/u in 1 week  * CKD stage III is stable  * Tobacco abuse  * Anemia of chronic disease is stable  Stable for discharge home    CONSULTS OBTAINED:  Treatment Team:  Isaias Cowman, MD  DRUG ALLERGIES:  No Known Allergies  DISCHARGE MEDICATIONS:   Current Discharge Medication List    START taking these medications   Details  ciprofloxacin (CIPRO) 250 MG tablet Take 1 tablet (250 mg total) by mouth 2 (two) times daily. Qty: 10 tablet, Refills: 0      CONTINUE these medications which have CHANGED   Details  carvedilol (COREG) 25 MG tablet Take 1 tablet (25 mg total) by mouth 2 (two) times daily with a meal. Qty: 60 tablet, Refills: 0      CONTINUE these medications which have NOT CHANGED   Details  aspirin (ASPIRIN CHILDRENS) 81 MG chewable tablet Chew 1 tablet (81 mg total) by mouth daily. Qty: 30 tablet, Refills: 0   Associated Diagnoses: Essential hypertension    atorvastatin (LIPITOR) 10 MG tablet Take 1 tablet (10 mg total) by mouth daily. Qty: 30 tablet, Refills: 4    bismuth subsalicylate (PEPTO BISMOL) 262 MG chewable tablet Chew 262 mg by mouth daily  as needed for indigestion or diarrhea or loose stools.    Cholecalciferol (HM VITAMIN D3) 2000 UNITS CAPS Take 2,000 Units by mouth daily.    hydrALAZINE (APRESOLINE) 25 MG tablet Take 1 tablet (25 mg total) by mouth 4 (four) times daily. Qty: 120 tablet, Refills: 4    isosorbide mononitrate (IMDUR) 30 MG 24 hr tablet Take 1 tablet (30 mg total) by mouth daily. Qty: 30 tablet, Refills: 0   Associated  Diagnoses: Essential hypertension    lisinopril (PRINIVIL,ZESTRIL) 10 MG tablet Take 1 tablet (10 mg total) by mouth daily. Qty: 90 tablet, Refills: 3   Associated Diagnoses: Chronic kidney disease, stage 4, severely decreased GFR; Benign essential HTN    Melatonin 10 MG CAPS Take 10 mg by mouth at bedtime as needed. Qty: 30 capsule, Refills: 0   Associated Diagnoses: Sleep - wake disorder         Today    VITAL SIGNS:  Blood pressure 107/68, pulse 97, temperature 97.8 F (36.6 C), temperature source Oral, resp. rate 17, height 6\' 1"  (1.854 m), weight 63.186 kg (139 lb 4.8 oz), SpO2 100 %.  I/O:   Intake/Output Summary (Last 24 hours) at 09/13/15 1416 Last data filed at 09/13/15 0830  Gross per 24 hour  Intake    530 ml  Output      4 ml  Net    526 ml    PHYSICAL EXAMINATION:  Physical Exam  GENERAL:  69 y.o.-year-old patient lying in the bed with no acute distress.  LUNGS: Normal breath sounds bilaterally, no wheezing, rales,rhonchi or crepitation. No use of accessory muscles of respiration.  CARDIOVASCULAR: S1, S2 normal. No murmurs, rubs, or gallops.  ABDOMEN: Soft, non-tender, non-distended. Bowel sounds present. No organomegaly or mass.  NEUROLOGIC: Moves all 4 extremities. PSYCHIATRIC: The patient is alert and oriented x 3.  SKIN: No obvious rash, lesion, or ulcer.   DATA REVIEW:   CBC  Recent Labs Lab 09/12/15 0354  WBC 7.8  HGB 9.7*  HCT 30.4*  PLT 184    Chemistries   Recent Labs Lab 09/13/15 0512  NA 135  K 4.3  CL 108  CO2 19*  GLUCOSE 105*  BUN 31*  CREATININE 2.21*  CALCIUM 8.9    Cardiac Enzymes  Recent Labs Lab 09/11/15 1220  TROPONINI 0.05*    Microbiology Results  Results for orders placed or performed during the hospital encounter of 07/29/15  Urine culture     Status: None   Collection Time: 07/29/15  6:40 PM  Result Value Ref Range Status   Specimen Description URINE, CLEAN CATCH  Final   Special Requests NONE   Final   Culture 80,000 COLONIES/ml PSEUDOMONAS AERUGINOSA  Final   Report Status 08/01/2015 FINAL  Final   Organism ID, Bacteria PSEUDOMONAS AERUGINOSA  Final      Susceptibility   Pseudomonas aeruginosa - MIC*    CEFTAZIDIME 4 SENSITIVE Sensitive     CIPROFLOXACIN <=0.25 SENSITIVE Sensitive     GENTAMICIN <=1 SENSITIVE Sensitive     IMIPENEM <=0.25 SENSITIVE Sensitive     PIP/TAZO Value in next row Sensitive      SENSITIVE8    * 80,000 COLONIES/ml PSEUDOMONAS AERUGINOSA    RADIOLOGY:  US Carotid Bilateral  09/11/2015   CLINICAL DATA:  Stroke, syncope, hypertension, visual spots, diabetes, previous tobacco abuse  EXAM: BILATERAL CAROTID DUPLEX ULTRASOUND  TECHNIQUE: Pearline Cables scale imaging, color Doppler and duplex ultrasound was performed of bilateral carotid and vertebral arteries in the  neck.  COMPARISON:  03/01/2015  REVIEW OF SYSTEMS: Quantification of carotid stenosis is based on velocity parameters that correlate the residual internal carotid diameter with NASCET-based stenosis levels, using the diameter of the distal internal carotid lumen as the denominator for stenosis measurement.  The following velocity measurements were obtained:  PEAK SYSTOLIC/END DIASTOLIC  RIGHT  ICA:                     58/21cm/sec  CCA:                     123456  SYSTOLIC ICA/CCA RATIO:  0.9  DIASTOLIC ICA/CCA RATIO: 1.7  ECA:                     85cm/sec  LEFT  ICA:                     7,119cm/sec  CCA:                     [id number]  SYSTOLIC ICA/CCA RATIO:  0.9  DIASTOLIC ICA/CCA RATIO: 1.5  ECA:                     84cm/sec  FINDINGS: RIGHT CAROTID ARTERY: Complex partially calcified plaque in the carotid bulb and at the ICA origin, resulting in at least mild stenosis. Normal waveforms and color Doppler signal however.  RIGHT VERTEBRAL ARTERY:  Normal flow direction and waveform.  LEFT CAROTID ARTERY: Partially calcified irregular plaque in the bulb and in the proximal ICA. No high-grade stenosis. Normal  waveforms and color Doppler signal.  LEFT VERTEBRAL ARTERY: Normal flow direction and waveform.  1. Bilateral carotid bifurcation and proximal ICA plaque, resulting in less than 50% diameter stenosis. The exam does not exclude plaque ulceration or embolization. Continued surveillance recommended.   Electronically Signed   By: Lucrezia Europe M.D.   On: 09/11/2015 17:04      Follow up with PCP in 1 week.  Management plans discussed with the patient, family and they are in agreement.  CODE STATUS:     Code Status Orders        Start     Ordered   09/11/15 1403  Full code   Continuous     09/11/15 1402      TOTAL TIME TAKING CARE OF THIS PATIENT ON DAY OF DISCHARGE: more than 30 minutes.    Hillary Bow R M.D on 09/13/2015 at 2:16 PM  Between 7am to 6pm - Pager - [phone]  After 6pm go to [email or web address] - password [email or web address] [facility]  [place] [facility]  Office  [phone]  CC: Primary care physician; Amy L Krebs, NP     Note: This dictation was prepared with Dragon dictation along with smaller phrase technology. Any transcriptional errors that result from this process are unintentional.

## 2015-09-13 NOTE — Clinical Social Work Note (Signed)
Signature received.  CSW signing off.

## 2015-09-13 NOTE — Clinical Social Work Note (Signed)
FL2 and DC summary are needed of pt is to DC back to his group home.  FL2 is on pt's chart.

## 2015-09-13 NOTE — Care Management Note (Signed)
Case Management Note  Patient Details  Name: Kyle Short MRN: [id number] Date of Birth: [birth year]  Subjective/Objective:  Faxed discharge summary, home health orders and face to face to [place] [facility] [place]. Called her to confirm receipt.                   Action/Plan:   Expected Discharge Date:                  Expected Discharge Plan:     In-House Referral:     Discharge planning Services     Post Acute Care Choice:    Choice offered to:     DME Arranged:    DME Agency:     HH Arranged:    [place] Agency:     Status of Service:     Medicare Important Message Given:    Date Medicare IM Given:    Medicare IM give by:    Date Additional Medicare IM Given:    Additional Medicare Important Message give by:     If discussed at [place] of Stay Meetings, dates discussed:    Additional Comments:  Jolly Mango, RN 09/13/2015, 2:31 PM

## 2015-09-13 NOTE — Clinical Social Work Note (Signed)
Received signature, however MD will need to sign DC summary because [facility] file is corrupted.MD notified.

## 2015-09-13 NOTE — Discharge Instructions (Signed)

## 2015-09-17 DIAGNOSIS — E1122 Type 2 diabetes mellitus with diabetic chronic kidney disease: Secondary | ICD-10-CM | POA: Diagnosis not present

## 2015-09-17 DIAGNOSIS — E785 Hyperlipidemia, unspecified: Secondary | ICD-10-CM | POA: Diagnosis not present

## 2015-09-17 DIAGNOSIS — F0390 Unspecified dementia without behavioral disturbance: Secondary | ICD-10-CM | POA: Diagnosis not present

## 2015-09-17 DIAGNOSIS — N183 Chronic kidney disease, stage 3 (moderate): Secondary | ICD-10-CM | POA: Diagnosis not present

## 2015-09-17 DIAGNOSIS — I5022 Chronic systolic (congestive) heart failure: Secondary | ICD-10-CM | POA: Diagnosis not present

## 2015-09-17 DIAGNOSIS — I129 Hypertensive chronic kidney disease with stage 1 through stage 4 chronic kidney disease, or unspecified chronic kidney disease: Secondary | ICD-10-CM | POA: Diagnosis not present

## 2015-09-25 DIAGNOSIS — N183 Chronic kidney disease, stage 3 (moderate): Secondary | ICD-10-CM | POA: Diagnosis not present

## 2015-09-25 DIAGNOSIS — E785 Hyperlipidemia, unspecified: Secondary | ICD-10-CM | POA: Diagnosis not present

## 2015-09-25 DIAGNOSIS — F0390 Unspecified dementia without behavioral disturbance: Secondary | ICD-10-CM | POA: Diagnosis not present

## 2015-09-25 DIAGNOSIS — I5022 Chronic systolic (congestive) heart failure: Secondary | ICD-10-CM | POA: Diagnosis not present

## 2015-09-25 DIAGNOSIS — E1122 Type 2 diabetes mellitus with diabetic chronic kidney disease: Secondary | ICD-10-CM | POA: Diagnosis not present

## 2015-09-25 DIAGNOSIS — I129 Hypertensive chronic kidney disease with stage 1 through stage 4 chronic kidney disease, or unspecified chronic kidney disease: Secondary | ICD-10-CM | POA: Diagnosis not present

## 2015-09-26 ENCOUNTER — Inpatient Hospital Stay: Payer: Self-pay | Admitting: Family Medicine

## 2015-10-03 DIAGNOSIS — E119 Type 2 diabetes mellitus without complications: Secondary | ICD-10-CM | POA: Diagnosis not present

## 2015-10-03 DIAGNOSIS — I10 Essential (primary) hypertension: Secondary | ICD-10-CM | POA: Diagnosis not present

## 2015-10-03 DIAGNOSIS — I429 Cardiomyopathy, unspecified: Secondary | ICD-10-CM | POA: Diagnosis not present

## 2015-10-03 DIAGNOSIS — E782 Mixed hyperlipidemia: Secondary | ICD-10-CM | POA: Diagnosis not present

## 2015-10-08 DIAGNOSIS — E1122 Type 2 diabetes mellitus with diabetic chronic kidney disease: Secondary | ICD-10-CM | POA: Diagnosis not present

## 2015-10-08 DIAGNOSIS — F0390 Unspecified dementia without behavioral disturbance: Secondary | ICD-10-CM | POA: Diagnosis not present

## 2015-10-08 DIAGNOSIS — I5022 Chronic systolic (congestive) heart failure: Secondary | ICD-10-CM | POA: Diagnosis not present

## 2015-10-08 DIAGNOSIS — E785 Hyperlipidemia, unspecified: Secondary | ICD-10-CM | POA: Diagnosis not present

## 2015-10-08 DIAGNOSIS — I129 Hypertensive chronic kidney disease with stage 1 through stage 4 chronic kidney disease, or unspecified chronic kidney disease: Secondary | ICD-10-CM | POA: Diagnosis not present

## 2015-10-08 DIAGNOSIS — N183 Chronic kidney disease, stage 3 (moderate): Secondary | ICD-10-CM | POA: Diagnosis not present

## 2015-10-13 LAB — HEMOGLOBIN A1C: Hgb A1c MFr Bld: 7.1 % — AB (ref 4.0–6.0)

## 2015-10-14 ENCOUNTER — Ambulatory Visit (INDEPENDENT_AMBULATORY_CARE_PROVIDER_SITE_OTHER): Payer: Medicare Other | Admitting: Family Medicine

## 2015-10-14 ENCOUNTER — Other Ambulatory Visit: Payer: Self-pay | Admitting: Family Medicine

## 2015-10-14 ENCOUNTER — Encounter: Payer: Self-pay | Admitting: Family Medicine

## 2015-10-14 VITALS — BP 101/64 | HR 64 | Temp 97.6°F | Resp 16 | Ht 73.0 in | Wt 143.2 lb

## 2015-10-14 DIAGNOSIS — I639 Cerebral infarction, unspecified: Secondary | ICD-10-CM | POA: Diagnosis not present

## 2015-10-14 DIAGNOSIS — Z23 Encounter for immunization: Secondary | ICD-10-CM | POA: Diagnosis not present

## 2015-10-14 DIAGNOSIS — N390 Urinary tract infection, site not specified: Secondary | ICD-10-CM

## 2015-10-14 DIAGNOSIS — Z720 Tobacco use: Secondary | ICD-10-CM

## 2015-10-14 DIAGNOSIS — I429 Cardiomyopathy, unspecified: Secondary | ICD-10-CM | POA: Insufficient documentation

## 2015-10-14 DIAGNOSIS — I252 Old myocardial infarction: Secondary | ICD-10-CM | POA: Insufficient documentation

## 2015-10-14 DIAGNOSIS — F17200 Nicotine dependence, unspecified, uncomplicated: Secondary | ICD-10-CM

## 2015-10-14 LAB — POCT URINALYSIS DIPSTICK
BILIRUBIN UA: NEGATIVE
Glucose, UA: NEGATIVE
Ketones, UA: NEGATIVE
PH UA: 6.5
Spec Grav, UA: 1.01
UROBILINOGEN UA: 0.2

## 2015-10-14 MED ORDER — NITROFURANTOIN MONOHYD MACRO 100 MG PO CAPS: 100.0000 mg | ORAL_CAPSULE | Freq: Two times a day (BID) | ORAL | Status: DC

## 2015-10-14 MED ORDER — CARVEDILOL 12.5 MG PO TABS: 12.5000 mg | ORAL_TABLET | Freq: Two times a day (BID) | ORAL | Status: DC

## 2015-10-14 NOTE — Patient Instructions (Signed)
Continue all current meds at current doses.  Discussed stopping smoking .  Info given.   Patient not motivated to do so at this time.

## 2015-10-14 NOTE — Progress Notes (Signed)
Name: CASY BRUNETTO   MRN: [id number]    DOB: 1946/08/30   Date:10/14/2015       Progress Note  Subjective  Chief Complaint  Chief Complaint  Patient presents with  . Urinary Tract Infection    Hospital Follow up- CVA 09/11/2015    HPI For f/u of UTI that caused hospitalization.  Off Cipro x about 2 weeks.  A little lethargic over weekend, but no urinary sx.  No fever.  No abdominal pain.  He is still smoking 1/4 ppd.  No problem-specific assessment & plan notes found for this encounter.   Past Medical History  Diagnosis Date  . Hypertension   . Lives in group home   . Hyperlipidemia   . Stroke ([place])   . Alcohol abuse   . Chronic kidney disease (CKD) stage G3b/A1, moderately decreased glomerular filtration rate (GFR) between 30-44 mL/min/1.73 square meter and albuminuria creatinine ratio less than 30 mg/g   . Anemia of chronic disease   . Alzheimer's dementia with behavioral disturbance   . Cardiomyopathy due to hypertension, without heart failure ([place])   . Myocardial infarction [facility])     Social History  Substance Use Topics  . Smoking status: Current Every Day Smoker -- 0.25 packs/day    Types: Cigarettes  . Smokeless tobacco: Never Used  . Alcohol Use: No     Current outpatient prescriptions:  .  aspirin (ASPIRIN CHILDRENS) 81 MG chewable tablet, Chew 1 tablet (81 mg total) by mouth daily., Disp: 30 tablet, Rfl: 0 .  atorvastatin (LIPITOR) 10 MG tablet, Take 1 tablet (10 mg total) by mouth daily., Disp: 30 tablet, Rfl: 4 .  bismuth subsalicylate (PEPTO BISMOL) 262 MG chewable tablet, Chew 262 mg by mouth daily as needed for indigestion or diarrhea or loose stools., Disp: , Rfl:  .  carvedilol (COREG) 12.5 MG tablet, Take 12.5 mg by mouth 2 (two) times daily with a meal., Disp: , Rfl:  .  Cholecalciferol (HM VITAMIN D3) 2000 UNITS CAPS, Take 2,000 Units by mouth daily., Disp: , Rfl:  .  hydrALAZINE (APRESOLINE) 25 MG tablet, Take 1 tablet (25 mg total) by mouth 4  (four) times daily., Disp: 120 tablet, Rfl: 4 .  isosorbide mononitrate (IMDUR) 30 MG 24 hr tablet, Take 1 tablet (30 mg total) by mouth daily., Disp: 30 tablet, Rfl: 0 .  lisinopril (PRINIVIL,ZESTRIL) 10 MG tablet, Take 1 tablet (10 mg total) by mouth daily., Disp: 90 tablet, Rfl: 3 .  Melatonin 10 MG CAPS, Take 10 mg by mouth at bedtime as needed., Disp: 30 capsule, Rfl: 0  No Known Allergies  Review of Systems  Constitutional: Positive for malaise/fatigue. Negative for fever and chills.  HENT: Negative for hearing loss.   Eyes: Negative for blurred vision and double vision.  Respiratory: Positive for cough. Negative for sputum production, shortness of breath and wheezing.   Cardiovascular: Negative for chest pain, palpitations, orthopnea and leg swelling.  Gastrointestinal: Negative for heartburn, abdominal pain and blood in stool.  Genitourinary: Negative for dysuria, urgency, frequency and hematuria.  Skin: Negative for rash.  Neurological: Negative for dizziness, tremors, weakness and headaches.      Objective  Filed Vitals:   10/14/15 1320  BP: 101/64  Pulse: 64  Temp: 97.6 F (36.4 C)  Resp: 16  Height: $Remove'6\' 1"'vEbrwfi$  (1.854 m)  Weight: 143 lb 3.2 oz (64.955 kg)     Physical Exam  Constitutional: He is oriented to person, place, and time and well-developed, well-nourished, and  in no distress. No distress.  HENT:  Head: Normocephalic and atraumatic.  Eyes: Conjunctivae and EOM are normal. Pupils are equal, round, and reactive to light.  Neck: Normal range of motion. Neck supple. No thyromegaly present.  Cardiovascular: Normal rate, regular rhythm, normal heart sounds and intact distal pulses.  Exam reveals no gallop and no friction rub.   No murmur heard. Pulmonary/Chest: Effort normal. No respiratory distress. He has decreased breath sounds in the right upper field, the right middle field, the right lower field, the left upper field, the left middle field and the left lower  field. He has no wheezes. He has no rales.  Abdominal: He exhibits no distension and no mass. There is no tenderness.  Musculoskeletal: He exhibits no edema.  Lymphadenopathy:    He has no cervical adenopathy.  Neurological: He is alert and oriented to person, place, and time.  Skin: Skin is warm and dry.  Vitals reviewed.     Recent Results (from the past 2160 hour(s))  CULTURE, URINE COMPREHENSIVE     Status: Abnormal   Collection Time: 07/26/15 12:00 AM  Result Value Ref Range   Urine Culture, Comprehensive Final report (A)    Result 1 Comment (A)     Comment: Pseudomonas aeruginosa Greater than 100,000 colony forming units per mL    ANTIMICROBIAL SUSCEPTIBILITY Comment     Comment:       ** S = Susceptible; I = Intermediate; R = Resistant **                    P = Positive; N = Negative             MICS are expressed in micrograms per mL    Antibiotic                 RSLT#1    RSLT#2    RSLT#3    RSLT#4 Amikacin                       S Cefepime                       S Ceftazidime                    R Ciprofloxacin                  I Gentamicin                     S Imipenem                       S Levofloxacin                   R Meropenem                      S Piperacillin                   S Ticarcillin                    S Tobramycin                     S   POCT HgB A1C     Status: Abnormal   Collection Time: 07/26/15  1:58 PM  Result Value Ref Range   Hemoglobin A1C 6.3  POCT urinalysis dipstick     Status: Abnormal   Collection Time: 07/26/15  2:15 PM  Result Value Ref Range   Color, UA yellow    Clarity, UA clear    Glucose, UA negative    Bilirubin, UA negative    Ketones, UA negative    Spec Grav, UA 1.010    Blood, UA negative    pH, UA 5.0    Protein, UA negative    Urobilinogen, UA negative    Nitrite, UA negative    Leukocytes, UA large (3+) (A) Negative  Basic Metabolic Panel (BMET)     Status: Abnormal   Collection Time: 07/29/15 10:25 AM   Result Value Ref Range   Glucose 128 (H) 65 - 99 mg/dL   BUN 46 (H) 8 - 27 mg/dL   Creatinine, Ser 7.43 (H) 0.76 - 1.27 mg/dL   GFR calc non Af Amer 17 (L) >59 mL/min/1.73   GFR calc Af Amer 20 (L) >59 mL/min/1.73   BUN/Creatinine Ratio 13 10 - 22   Sodium 136 134 - 144 mmol/L   Potassium 5.1 3.5 - 5.2 mmol/L   Chloride 101 97 - 108 mmol/L   CO2 19 18 - 29 mmol/L   Calcium 9.3 8.6 - 10.2 mg/dL  Basic metabolic panel     Status: Abnormal   Collection Time: 07/29/15  6:39 PM  Result Value Ref Range   Sodium 136 135 - 145 mmol/L   Potassium 5.1 3.5 - 5.1 mmol/L   Chloride 106 101 - 111 mmol/L   CO2 21 (L) 22 - 32 mmol/L   Glucose, Bld 90 65 - 99 mg/dL   BUN 48 (H) 6 - 20 mg/dL   Creatinine, Ser 4.36 (H) 0.61 - 1.24 mg/dL   Calcium 9.3 8.9 - 20.5 mg/dL   GFR calc non Af Amer 15 (L) >60 mL/min   GFR calc Af Amer 18 (L) >60 mL/min    Comment: (NOTE) The eGFR has been calculated using the CKD EPI equation. This calculation has not been validated in all clinical situations. eGFR's persistently <60 mL/min signify possible Chronic Kidney Disease.    Anion gap 9 5 - 15  CBC     Status: Abnormal   Collection Time: 07/29/15  6:39 PM  Result Value Ref Range   WBC 6.3 3.8 - 10.6 K/uL   RBC 3.74 (L) 4.40 - 5.90 MIL/uL   Hemoglobin 10.4 (L) 13.0 - 18.0 g/dL   HCT 85.9 (L) 53.7 - 96.2 %   MCV 86.5 80.0 - 100.0 fL   MCH 27.8 26.0 - 34.0 pg   MCHC 32.1 32.0 - 36.0 g/dL   RDW 67.0 51.8 - 63.6 %   Platelets 261 150 - 440 K/uL  Urinalysis complete, with microscopic- may I&O cath if menses [facility] only)     Status: Abnormal   Collection Time: 07/29/15  6:40 PM  Result Value Ref Range   Color, Urine YELLOW (A) YELLOW   APPearance HAZY (A) CLEAR   Glucose, UA NEGATIVE NEGATIVE mg/dL   Bilirubin Urine NEGATIVE NEGATIVE   Ketones, ur NEGATIVE NEGATIVE mg/dL   Specific Gravity, Urine 1.015 1.005 - 1.030   Hgb urine dipstick NEGATIVE NEGATIVE   pH 5.0 5.0 - 8.0   Protein, ur NEGATIVE  NEGATIVE mg/dL   Nitrite NEGATIVE NEGATIVE   Leukocytes, UA 3+ (A) NEGATIVE   RBC / HPF 0-5 0 - 5 RBC/hpf   WBC, UA TOO NUMEROUS TO COUNT 0 - 5 WBC/hpf  Bacteria, UA NONE SEEN NONE SEEN   Squamous Epithelial / LPF 0-5 (A) NONE SEEN   Mucous PRESENT   Urine culture     Status: None   Collection Time: 07/29/15  6:40 PM  Result Value Ref Range   Specimen Description URINE, CLEAN CATCH    Special Requests NONE    Culture 80,000 COLONIES/ml PSEUDOMONAS AERUGINOSA    Report Status 08/01/2015 FINAL    Organism ID, Bacteria PSEUDOMONAS AERUGINOSA       Susceptibility   Pseudomonas aeruginosa - MIC*    CEFTAZIDIME 4 SENSITIVE Sensitive     CIPROFLOXACIN <=0.25 SENSITIVE Sensitive     GENTAMICIN <=1 SENSITIVE Sensitive     IMIPENEM <=0.25 SENSITIVE Sensitive     PIP/TAZO Value in next row Sensitive      SENSITIVE8    * 80,000 COLONIES/ml PSEUDOMONAS AERUGINOSA  CBC     Status: Abnormal   Collection Time: 09/11/15 12:20 PM  Result Value Ref Range   WBC 5.7 3.8 - 10.6 K/uL   RBC 3.51 (L) 4.40 - 5.90 MIL/uL   Hemoglobin 9.6 (L) 13.0 - 18.0 g/dL   HCT 30.7 (L) 40.0 - 52.0 %   MCV 87.5 80.0 - 100.0 fL   MCH 27.4 26.0 - 34.0 pg   MCHC 31.3 (L) 32.0 - 36.0 g/dL   RDW 14.8 (H) 11.5 - 14.5 %   Platelets 187 150 - 440 K/uL  Basic metabolic panel     Status: Abnormal   Collection Time: 09/11/15 12:20 PM  Result Value Ref Range   Sodium 137 135 - 145 mmol/L   Potassium 5.1 3.5 - 5.1 mmol/L   Chloride 111 101 - 111 mmol/L   CO2 23 22 - 32 mmol/L   Glucose, Bld 171 (H) 65 - 99 mg/dL   BUN 36 (H) 6 - 20 mg/dL   Creatinine, Ser 2.84 (H) 0.61 - 1.24 mg/dL   Calcium 9.0 8.9 - 10.3 mg/dL   GFR calc non Af Amer 21 (L) >60 mL/min   GFR calc Af Amer 25 (L) >60 mL/min    Comment: (NOTE) The eGFR has been calculated using the CKD EPI equation. This calculation has not been validated in all clinical situations. eGFR's persistently <60 mL/min signify possible Chronic Kidney Disease.    Anion gap  3 (L) 5 - 15  Troponin I     Status: Abnormal   Collection Time: 09/11/15 12:20 PM  Result Value Ref Range   Troponin I 0.05 (H) <0.031 ng/mL    Comment: READ BACK AND VERIFIED TO KATIE CRANSTON AT 1194 ON 09/11/15.Marland KitchenMarland KitchenMount Dora        PERSISTENTLY INCREASED TROPONIN VALUES IN THE RANGE OF 0.04-0.49 ng/mL CAN BE SEEN IN:       -UNSTABLE ANGINA       -CONGESTIVE HEART FAILURE       -MYOCARDITIS       -CHEST TRAUMA       -ARRYHTHMIAS       -LATE PRESENTING MYOCARDIAL INFARCTION       -COPD   CLINICAL FOLLOW-UP RECOMMENDED.   Urinalysis complete, with microscopic ([facility] only)     Status: Abnormal   Collection Time: 09/11/15  1:09 PM  Result Value Ref Range   Color, Urine YELLOW (A) YELLOW   APPearance HAZY (A) CLEAR   Glucose, UA NEGATIVE NEGATIVE mg/dL   Bilirubin Urine NEGATIVE NEGATIVE   Ketones, ur NEGATIVE NEGATIVE mg/dL   Specific Gravity, Urine 1.012 1.005 - 1.030   Hgb urine  dipstick 3+ (A) NEGATIVE   pH 8.0 5.0 - 8.0   Protein, ur NEGATIVE NEGATIVE mg/dL   Nitrite NEGATIVE NEGATIVE   Leukocytes, UA 2+ (A) NEGATIVE   RBC / HPF TOO NUMEROUS TO COUNT 0 - 5 RBC/hpf   WBC, UA TOO NUMEROUS TO COUNT 0 - 5 WBC/hpf   Bacteria, UA FEW (A) NONE SEEN   Squamous Epithelial / LPF 0-5 (A) NONE SEEN  Basic metabolic panel     Status: Abnormal   Collection Time: 09/12/15  3:54 AM  Result Value Ref Range   Sodium 138 135 - 145 mmol/L   Potassium 4.1 3.5 - 5.1 mmol/L   Chloride 110 101 - 111 mmol/L   CO2 22 22 - 32 mmol/L   Glucose, Bld 121 (H) 65 - 99 mg/dL   BUN 31 (H) 6 - 20 mg/dL   Creatinine, Ser 2.58 (H) 0.61 - 1.24 mg/dL   Calcium 8.8 (L) 8.9 - 10.3 mg/dL   GFR calc non Af Amer 24 (L) >60 mL/min   GFR calc Af Amer 28 (L) >60 mL/min    Comment: (NOTE) The eGFR has been calculated using the CKD EPI equation. This calculation has not been validated in all clinical situations. eGFR's persistently <60 mL/min signify possible Chronic Kidney Disease.    Anion gap 6 5 - 15  CBC      Status: Abnormal   Collection Time: 09/12/15  3:54 AM  Result Value Ref Range   WBC 7.8 3.8 - 10.6 K/uL   RBC 3.53 (L) 4.40 - 5.90 MIL/uL   Hemoglobin 9.7 (L) 13.0 - 18.0 g/dL   HCT 30.4 (L) 40.0 - 52.0 %   MCV 86.1 80.0 - 100.0 fL   MCH 27.4 26.0 - 34.0 pg   MCHC 31.8 (L) 32.0 - 36.0 g/dL   RDW 14.1 11.5 - 14.5 %   Platelets 184 150 - 440 K/uL  Basic metabolic panel     Status: Abnormal   Collection Time: 09/13/15  5:12 AM  Result Value Ref Range   Sodium 135 135 - 145 mmol/L   Potassium 4.3 3.5 - 5.1 mmol/L   Chloride 108 101 - 111 mmol/L   CO2 19 (L) 22 - 32 mmol/L   Glucose, Bld 105 (H) 65 - 99 mg/dL   BUN 31 (H) 6 - 20 mg/dL   Creatinine, Ser 2.21 (H) 0.61 - 1.24 mg/dL   Calcium 8.9 8.9 - 10.3 mg/dL   GFR calc non Af Amer 29 (L) >60 mL/min   GFR calc Af Amer 34 (L) >60 mL/min    Comment: (NOTE) The eGFR has been calculated using the CKD EPI equation. This calculation has not been validated in all clinical situations. eGFR's persistently <60 mL/min signify possible Chronic Kidney Disease.    Anion gap 8 5 - 15  Hemoglobin A1c     Status: Abnormal   Collection Time: 10/13/15  1:13 PM  Result Value Ref Range   Hgb A1c MFr Bld 7.1 (A) 4.0 - 6.0 %  POCT Urinalysis Dipstick     Status: Abnormal   Collection Time: 10/14/15  1:38 PM  Result Value Ref Range   Color, UA yellow     Comment: cloudy   Clarity, UA cloudy    Glucose, UA neg    Bilirubin, UA neg    Ketones, UA neg    Spec Grav, UA 1.010    Blood, UA trace    pH, UA 6.5    Protein, UA  trace    Urobilinogen, UA 0.2    Nitrite, UA large    Leukocytes, UA small (1+) (A) Negative     Assessment & Plan   1. Urinary tract infection without hematuria, site unspecified  - Urine Culture; Future - POCT Urinalysis Dipstick - nitrofurantoin, macrocrystal-monohydrate, (MACROBID) 100 MG capsule; Take 1 capsule (100 mg total) by mouth 2 (two) times daily.  Dispense: 14 capsule; Refill: 0  2. Need for influenza  vaccination  - Flu vaccine HIGH DOSE PF (Fluzone High dose)  3. Smoker  - Nurse to provide smoking / tobacco cessation education

## 2015-10-14 NOTE — Addendum Note (Signed)
Addended by: Theresia Majors A on: 10/14/2015 03:16 PM   Modules accepted: Miquel Dunn

## 2015-10-15 NOTE — Addendum Note (Signed)
Addended by: Theresia Majors A on: 10/15/2015 03:10 PM   Modules accepted: Miquel Dunn

## 2015-10-17 ENCOUNTER — Other Ambulatory Visit: Payer: Self-pay | Admitting: Family Medicine

## 2015-10-17 LAB — URINE CULTURE

## 2015-10-17 MED ORDER — CIPROFLOXACIN HCL 500 MG PO TABS: 500.0000 mg | ORAL_TABLET | Freq: Two times a day (BID) | ORAL | Status: DC

## 2015-10-17 NOTE — Addendum Note (Signed)
Addended by: Devona Konig on: 10/17/2015 01:37 PM   Modules accepted: Orders, Medications

## 2015-10-21 ENCOUNTER — Other Ambulatory Visit: Payer: Self-pay | Admitting: *Deleted

## 2015-10-21 DIAGNOSIS — I10 Essential (primary) hypertension: Secondary | ICD-10-CM

## 2015-10-21 DIAGNOSIS — N184 Chronic kidney disease, stage 4 (severe): Secondary | ICD-10-CM

## 2015-10-21 MED ORDER — LISINOPRIL 10 MG PO TABS: 10.0000 mg | ORAL_TABLET | Freq: Every day | ORAL | Status: DC

## 2015-10-28 ENCOUNTER — Telehealth: Payer: Self-pay | Admitting: Family Medicine

## 2015-10-28 ENCOUNTER — Other Ambulatory Visit: Payer: Self-pay | Admitting: Family Medicine

## 2015-10-28 DIAGNOSIS — I10 Essential (primary) hypertension: Secondary | ICD-10-CM

## 2015-10-28 DIAGNOSIS — G4720 Circadian rhythm sleep disorder, unspecified type: Secondary | ICD-10-CM

## 2015-10-28 DIAGNOSIS — E785 Hyperlipidemia, unspecified: Secondary | ICD-10-CM | POA: Diagnosis not present

## 2015-10-28 DIAGNOSIS — F0390 Unspecified dementia without behavioral disturbance: Secondary | ICD-10-CM | POA: Diagnosis not present

## 2015-10-28 DIAGNOSIS — E1122 Type 2 diabetes mellitus with diabetic chronic kidney disease: Secondary | ICD-10-CM | POA: Diagnosis not present

## 2015-10-28 DIAGNOSIS — I129 Hypertensive chronic kidney disease with stage 1 through stage 4 chronic kidney disease, or unspecified chronic kidney disease: Secondary | ICD-10-CM | POA: Diagnosis not present

## 2015-10-28 DIAGNOSIS — N183 Chronic kidney disease, stage 3 (moderate): Secondary | ICD-10-CM | POA: Diagnosis not present

## 2015-10-28 DIAGNOSIS — I5022 Chronic systolic (congestive) heart failure: Secondary | ICD-10-CM | POA: Diagnosis not present

## 2015-10-28 MED ORDER — ISOSORBIDE MONONITRATE ER 30 MG PO TB24: 30.0000 mg | ORAL_TABLET | Freq: Every day | ORAL | Status: DC

## 2015-10-28 MED ORDER — MELATONIN 10 MG PO CAPS: 10.0000 mg | ORAL_CAPSULE | Freq: Every evening | ORAL | Status: DC | PRN

## 2015-10-28 NOTE — Telephone Encounter (Signed)
Mickel Baas from  [facility] called states that pt have  Completed all visit ready for  D/S need a verbal. Mickel Baas call back # is [phone]

## 2015-10-28 NOTE — Telephone Encounter (Signed)
Done

## 2015-10-28 NOTE — Telephone Encounter (Signed)
OK to discontinue services if he has received full benefit-jh

## 2015-11-01 ENCOUNTER — Telehealth: Payer: Self-pay | Admitting: Family Medicine

## 2015-11-01 ENCOUNTER — Other Ambulatory Visit: Payer: Self-pay | Admitting: Family Medicine

## 2015-11-01 DIAGNOSIS — I10 Essential (primary) hypertension: Secondary | ICD-10-CM

## 2015-11-01 MED ORDER — HYDRALAZINE HCL 25 MG PO TABS: 25.0000 mg | ORAL_TABLET | Freq: Four times a day (QID) | ORAL | Status: DC

## 2015-11-01 MED ORDER — ASPIRIN 81 MG PO CHEW: 81.0000 mg | CHEWABLE_TABLET | Freq: Every day | ORAL | Status: DC

## 2015-11-01 NOTE — Telephone Encounter (Signed)
Pt needs a refill on hydralazine 25 mg sent to [facility].

## 2015-11-01 NOTE — Telephone Encounter (Signed)
Request already submitted to Amy.

## 2015-11-04 ENCOUNTER — Ambulatory Visit: Payer: Self-pay | Admitting: Family Medicine

## 2015-11-04 ENCOUNTER — Telehealth: Payer: Self-pay | Admitting: Family Medicine

## 2015-11-04 MED ORDER — CHOLECALCIFEROL 50 MCG (2000 UT) PO CAPS: 2000.0000 [IU] | ORAL_CAPSULE | Freq: Every day | ORAL | Status: DC

## 2015-11-04 NOTE — Telephone Encounter (Signed)
Sent!

## 2015-11-04 NOTE — Telephone Encounter (Signed)
Stella called  Requesting  Refill on   VitD

## 2015-12-02 ENCOUNTER — Ambulatory Visit (INDEPENDENT_AMBULATORY_CARE_PROVIDER_SITE_OTHER): Payer: Medicare Other | Admitting: Family Medicine

## 2015-12-02 VITALS — BP 130/82 | HR 58 | Temp 97.3°F | Resp 16 | Ht 73.0 in | Wt 146.0 lb

## 2015-12-02 DIAGNOSIS — F0281 Dementia in other diseases classified elsewhere with behavioral disturbance: Secondary | ICD-10-CM

## 2015-12-02 DIAGNOSIS — F02818 Dementia in other diseases classified elsewhere, unspecified severity, with other behavioral disturbance: Secondary | ICD-10-CM

## 2015-12-02 DIAGNOSIS — G308 Other Alzheimer's disease: Secondary | ICD-10-CM

## 2015-12-02 DIAGNOSIS — G309 Alzheimer's disease, unspecified: Secondary | ICD-10-CM

## 2015-12-02 DIAGNOSIS — Z1211 Encounter for screening for malignant neoplasm of colon: Secondary | ICD-10-CM | POA: Diagnosis not present

## 2015-12-02 DIAGNOSIS — N184 Chronic kidney disease, stage 4 (severe): Secondary | ICD-10-CM | POA: Diagnosis not present

## 2015-12-02 DIAGNOSIS — I639 Cerebral infarction, unspecified: Secondary | ICD-10-CM | POA: Diagnosis not present

## 2015-12-02 DIAGNOSIS — N390 Urinary tract infection, site not specified: Secondary | ICD-10-CM | POA: Diagnosis not present

## 2015-12-02 DIAGNOSIS — I129 Hypertensive chronic kidney disease with stage 1 through stage 4 chronic kidney disease, or unspecified chronic kidney disease: Secondary | ICD-10-CM | POA: Diagnosis not present

## 2015-12-02 DIAGNOSIS — R7309 Other abnormal glucose: Secondary | ICD-10-CM

## 2015-12-02 DIAGNOSIS — I429 Cardiomyopathy, unspecified: Secondary | ICD-10-CM | POA: Diagnosis not present

## 2015-12-02 DIAGNOSIS — Z720 Tobacco use: Secondary | ICD-10-CM | POA: Diagnosis not present

## 2015-12-02 DIAGNOSIS — E1122 Type 2 diabetes mellitus with diabetic chronic kidney disease: Secondary | ICD-10-CM

## 2015-12-02 DIAGNOSIS — R739 Hyperglycemia, unspecified: Secondary | ICD-10-CM

## 2015-12-02 LAB — POCT URINALYSIS DIPSTICK
Bilirubin, UA: NEGATIVE
Glucose, UA: NEGATIVE
PROTEIN UA: NEGATIVE
RBC UA: NEGATIVE
SPEC GRAV UA: 1.015
UROBILINOGEN UA: NEGATIVE
pH, UA: 5

## 2015-12-02 LAB — POCT UA - MICROALBUMIN
Albumin/Creatinine Ratio, Urine, POC: 0
Creatinine, POC: 0 mg/dL
MICROALBUMIN (UR) POC: 0 mg/L

## 2015-12-02 LAB — POCT GLYCOSYLATED HEMOGLOBIN (HGB A1C): Hemoglobin A1C: 6.2

## 2015-12-02 NOTE — Assessment & Plan Note (Addendum)
A1c 6.2% today. Pt is opting for no treatment due to multiple chronic conditions.  Referred for eye exam. ACE for renal protection. Foot exam UTD.  RTC 6 mos.

## 2015-12-02 NOTE — Assessment & Plan Note (Signed)
Behavior at baseline. Taking medications as directed.  Caregiver reports no major interval changes in memory or behavior.

## 2015-12-02 NOTE — Assessment & Plan Note (Signed)
Managed by nephrology. Pt on ACE for renal protection. Sees Dr. Kristin Bruins in the winter for follow-up. Check BMP to ensure kidney function recovered from ARF during last hospitalization.  Encouraged adequate hydration to help with kidneys. Facility has a hard time getting him to drink.

## 2015-12-02 NOTE — Addendum Note (Signed)
Addended by: Frederich Cha D on: 12/02/2015 02:27 PM   Modules accepted: Orders, SmartSet

## 2015-12-02 NOTE — Assessment & Plan Note (Signed)
Pt is not willing to quit at this time. 5 minutes of smoking cessation counseling reviewed.

## 2015-12-02 NOTE — Assessment & Plan Note (Addendum)
No evidence of CHF as per cardiology. Followed by [facility] cardiology. Due for follow-up in April 2017.

## 2015-12-02 NOTE — Progress Notes (Signed)
Subjective:    Patient ID: Kyle Short, male    DOB: [birth year], 69 y.o.   MRN: [id number]  HPI: Kyle Short is a 69 y.o. male presenting on 12/02/2015 for Hypertension and Diabetes   HPI  Pt presents for hypertension and diabetes follow-up. Overall doing well. No chest pain, SOB, or feeling like he will pass out. No HA or visual changes.  Pt is being co-managed by nephrology, Dr. Kristin Bruins for his kidneys- seen every 6 mos.  Diabetes: Has not had eye exam this year. Facility is checking sugars- avg 120. Choosing not to treat with medications due to his multiple health problems. Last A1c was 7.1%.  No numbness or tingling in his feet.  Pt treated for pseudamonas UTI in November.Due for follow-up urine culture.  Pt is still smoking daily. Unwilling to quit.   Past Medical History  Diagnosis Date  . Hypertension   . Lives in group home   . Hyperlipidemia   . Stroke ([place])   . Alcohol abuse   . Chronic kidney disease (CKD) stage G3b/A1, moderately decreased glomerular filtration rate (GFR) between 30-44 mL/min/1.73 square meter and albuminuria creatinine ratio less than 30 mg/g   . Anemia of chronic disease   . Alzheimer's dementia with behavioral disturbance   . Cardiomyopathy due to hypertension, without heart failure ([place])   . Myocardial infarction [facility])     Current Outpatient Prescriptions on File Prior to Visit  Medication Sig  . aspirin (ASPIRIN CHILDRENS) 81 MG chewable tablet Chew 1 tablet (81 mg total) by mouth daily.  Marland Kitchen atorvastatin (LIPITOR) 10 MG tablet Take 1 tablet (10 mg total) by mouth daily.  Marland Kitchen bismuth subsalicylate (PEPTO BISMOL) 262 MG chewable tablet Chew 262 mg by mouth daily as needed for indigestion or diarrhea or loose stools.  . carvedilol (COREG) 12.5 MG tablet Take 1 tablet (12.5 mg total) by mouth 2 (two) times daily with a meal.  . Cholecalciferol (HM VITAMIN D3) 2000 UNITS CAPS Take 1 capsule (2,000 Units total) by mouth daily.  . ciprofloxacin  (CIPRO) 500 MG tablet Take 1 tablet (500 mg total) by mouth 2 (two) times daily.  . hydrALAZINE (APRESOLINE) 25 MG tablet Take 1 tablet (25 mg total) by mouth 4 (four) times daily.  . isosorbide mononitrate (IMDUR) 30 MG 24 hr tablet Take 1 tablet (30 mg total) by mouth daily.  Marland Kitchen lisinopril (PRINIVIL,ZESTRIL) 10 MG tablet Take 1 tablet (10 mg total) by mouth daily.  . Melatonin 10 MG CAPS Take 10 mg by mouth at bedtime as needed.   No current facility-administered medications on file prior to visit.    Review of Systems  Constitutional: Negative for fever and chills.  HENT: Negative.   Respiratory: Negative for chest tightness, shortness of breath and wheezing.   Cardiovascular: Negative for chest pain, palpitations and leg swelling.  Gastrointestinal: Negative for nausea, vomiting and abdominal pain.  Endocrine: Negative.   Genitourinary: Negative for dysuria, urgency, discharge, penile pain and testicular pain.  Musculoskeletal: Negative for back pain, joint swelling and arthralgias.  Skin: Negative.   Neurological: Negative for dizziness, weakness, numbness and headaches.  Psychiatric/Behavioral: Positive for confusion (baseline). Negative for sleep disturbance and dysphoric mood.   Per HPI unless specifically indicated above     Objective:    BP 130/82 mmHg  Pulse 58  Temp(Src) 97.3 F (36.3 C) (Oral)  Resp 16  Ht 6\' 1"  (1.854 m)  Wt 146 lb (66.225 kg)  BMI 19.27 kg/m2  Wt Readings from Last 3 Encounters:  12/02/15 146 lb (66.225 kg)  10/14/15 143 lb 3.2 oz (64.955 kg)  09/11/15 139 lb 4.8 oz (63.186 kg)    Physical Exam  Constitutional: He appears well-developed and well-nourished. No distress.  HENT:  Head: Normocephalic and atraumatic.  Neck: Neck supple. No thyromegaly present.  Cardiovascular: Normal rate, regular rhythm and normal heart sounds.  Exam reveals no gallop and no friction rub.   No murmur heard. Pulmonary/Chest: Effort normal and breath sounds  normal. He has no wheezes.  Abdominal: Soft. Bowel sounds are normal. He exhibits no distension. There is no tenderness. There is no rebound.  Musculoskeletal: Normal range of motion. He exhibits no edema or tenderness.  Neurological: He is alert. He has normal strength and normal reflexes. He is disoriented (disoriented to time.). No cranial nerve deficit or sensory deficit. He displays a negative Romberg sign.  Skin: Skin is warm and dry. No rash noted. No erythema.  Psychiatric: He has a normal mood and affect. His behavior is normal. Thought content normal.   Results for orders placed or performed in visit on 12/02/15  POCT HgB A1C  Result Value Ref Range   Hemoglobin A1C 6.2   POCT UA - Microalbumin  Result Value Ref Range   Microalbumin Ur, POC 0 mg/L   Creatinine, POC 0 mg/dL   Albumin/Creatinine Ratio, Urine, POC 0       Assessment & Plan:   Problem List Items Addressed This Visit      Cardiovascular and Mediastinum   Cardiomyopathy ([place])    No evidence of CHF as per cardiology. Followed by [facility] cardiology. Due for follow-up in April 2017.       Benign essential HTN    Controlled today in the office. Continue current medication regimen. Check BMP to monitor kidney function. RTC 6 mos.         Endocrine   Type 2 diabetes mellitus with chronic kidney disease (HCC)    A1c 6.2% today. Pt is opting for no treatment due to multiple chronic conditions.  Referred for eye exam. ACE for renal protection. Foot exam UTD.  RTC 6 mos.       Relevant Orders   POCT UA - Microalbumin (Completed)   Ambulatory referral to Ophthalmology     Nervous and Auditory   Dementia, Alzheimer's, with behavior disturbance    Behavior at baseline. Taking medications as directed.  Caregiver reports no major interval changes in memory or behavior.         Genitourinary   Chronic kidney disease, stage 4, severely decreased GFR ([place])    Managed by nephrology. Pt on ACE for renal protection.  Sees Dr. Kristin Bruins in the winter for follow-up. Check BMP to ensure kidney function recovered from ARF during last hospitalization.  Encouraged adequate hydration to help with kidneys. Facility has a hard time getting him to drink.       UTI (urinary tract infection) - Primary    Follow-up urine culture needs for pseudamonas UTI from November. Consider urology referral if urine is still positive.       Relevant Orders   CULTURE, URINE COMPREHENSIVE     Other   Current tobacco use    Pt is not willing to quit at this time. 5 minutes of smoking cessation counseling reviewed.        Other Visit Diagnoses    Elevated blood sugar        Relevant Orders    POCT  HgB A1C (Completed)    Screening for colon cancer        Relevant Orders    Cologuard       No orders of the defined types were placed in this encounter.      Follow up plan: Return in about 6 months (around 06/01/2016) for diabetes.

## 2015-12-02 NOTE — Assessment & Plan Note (Signed)
Follow-up urine culture needs for pseudamonas UTI from November. Consider urology referral if urine is still positive.

## 2015-12-02 NOTE — Assessment & Plan Note (Signed)
Controlled today in the office. Continue current medication regimen. Check BMP to monitor kidney function. RTC 6 mos.

## 2015-12-02 NOTE — Patient Instructions (Signed)
Smoking Cessation, Tips for Success If you are ready to quit smoking, congratulations! You have chosen to help yourself be healthier. Cigarettes bring nicotine, tar, carbon monoxide, and other irritants into your body. Your lungs, heart, and blood vessels will be able to work better without these poisons. There are many different ways to quit smoking. Nicotine gum, nicotine patches, a nicotine inhaler, or nicotine nasal spray can help with physical craving. Hypnosis, support groups, and medicines help break the habit of smoking. WHAT THINGS CAN I DO TO MAKE QUITTING EASIER?  Here are some tips to help you quit for good:  Pick a date when you will quit smoking completely. Tell all of your friends and family about your plan to quit on that date.  Do not try to slowly cut down on the number of cigarettes you are smoking. Pick a quit date and quit smoking completely starting on that day.  Throw away all cigarettes.   Clean and remove all ashtrays from your home, work, and car.  On a card, write down your reasons for quitting. Carry the card with you and read it when you get the urge to smoke.  Cleanse your body of nicotine. Drink enough water and fluids to keep your urine clear or pale yellow. Do this after quitting to flush the nicotine from your body.  Learn to predict your moods. Do not let a bad situation be your excuse to have a cigarette. Some situations in your life might tempt you into wanting a cigarette.  Never have "just one" cigarette. It leads to wanting another and another. Remind yourself of your decision to quit.  Change habits associated with smoking. If you smoked while driving or when feeling stressed, try other activities to replace smoking. Stand up when drinking your coffee. Brush your teeth after eating. Sit in a different chair when you read the paper. Avoid alcohol while trying to quit, and try to drink fewer caffeinated beverages. Alcohol and caffeine may urge you to  smoke.  Avoid foods and drinks that can trigger a desire to smoke, such as sugary or spicy foods and alcohol.  Ask people who smoke not to smoke around you.  Have something planned to do right after eating or having a cup of coffee. For example, plan to take a walk or exercise.  Try a relaxation exercise to calm you down and decrease your stress. Remember, you may be tense and nervous for the first 2 weeks after you quit, but this will pass.  Find new activities to keep your hands busy. Play with a pen, coin, or rubber band. Doodle or draw things on paper.  Brush your teeth right after eating. This will help cut down on the craving for the taste of tobacco after meals. You can also try mouthwash.   Use oral substitutes in place of cigarettes. Try using lemon drops, carrots, cinnamon sticks, or chewing gum. Keep them handy so they are available when you have the urge to smoke.  When you have the urge to smoke, try deep breathing.  Designate your home as a nonsmoking area.  If you are a heavy smoker, ask your health care provider about a prescription for nicotine chewing gum. It can ease your withdrawal from nicotine.  Reward yourself. Set aside the cigarette money you save and buy yourself something nice.  Look for support from others. Join a support group or smoking cessation program. Ask someone at home or at work to help you with your plan   to quit smoking.  Always ask yourself, "Do I need this cigarette or is this just a reflex?" Tell yourself, "Today, I choose not to smoke," or "I do not want to smoke." You are reminding yourself of your decision to quit.  Do not replace cigarette smoking with electronic cigarettes (commonly called e-cigarettes). The safety of e-cigarettes is unknown, and some may contain harmful chemicals.  If you relapse, do not give up! Plan ahead and think about what you will do the next time you get the urge to smoke. HOW WILL I FEEL WHEN I QUIT SMOKING? You  may have symptoms of withdrawal because your body is used to nicotine (the addictive substance in cigarettes). You may crave cigarettes, be irritable, feel very hungry, cough often, get headaches, or have difficulty concentrating. The withdrawal symptoms are only temporary. They are strongest when you first quit but will go away within 10-14 days. When withdrawal symptoms occur, stay in control. Think about your reasons for quitting. Remind yourself that these are signs that your body is healing and getting used to being without cigarettes. Remember that withdrawal symptoms are easier to treat than the major diseases that smoking can cause.  Even after the withdrawal is over, expect periodic urges to smoke. However, these cravings are generally short lived and will go away whether you smoke or not. Do not smoke! WHAT RESOURCES ARE AVAILABLE TO HELP ME QUIT SMOKING? Your health care provider can direct you to community resources or hospitals for support, which may include:  Group support.  Education.  Hypnosis.  Therapy.   This information is not intended to replace advice given to you by your health care provider. Make sure you discuss any questions you have with your health care provider.   Document Released: 08/28/2004 Document Revised: 12/21/2014 Document Reviewed: 05/18/2013 Elsevier Interactive Patient Education 2016 Elsevier Inc.  

## 2015-12-05 ENCOUNTER — Telehealth: Payer: Self-pay | Admitting: Family Medicine

## 2015-12-05 DIAGNOSIS — N390 Urinary tract infection, site not specified: Secondary | ICD-10-CM

## 2015-12-05 DIAGNOSIS — A499 Bacterial infection, unspecified: Secondary | ICD-10-CM

## 2015-12-05 LAB — CULTURE, URINE COMPREHENSIVE

## 2015-12-05 MED ORDER — CEFIXIME 500 MG/5ML PO SUSR: 2.6000 mL | Freq: Every day | ORAL | Status: DC

## 2015-12-05 NOTE — Telephone Encounter (Signed)
Spoke with caregiver. He has another resistent UTI. Spoke with clinical pharmacist he can take Suprax 260mg  daily. Sent to pharmacy. He needs follow-up UC in 3 weeks.

## 2015-12-05 NOTE — Telephone Encounter (Signed)
[facility] pharmacist called stated Suprax is not available. Dr. Luan Pulling changed rx to Ceftin 500 mg #14 1 tab twice daily for 7 days.

## 2015-12-11 ENCOUNTER — Other Ambulatory Visit: Payer: Self-pay | Admitting: Family Medicine

## 2015-12-11 MED ORDER — CARVEDILOL 12.5 MG PO TABS: 12.5000 mg | ORAL_TABLET | Freq: Two times a day (BID) | ORAL | Status: DC

## 2015-12-20 ENCOUNTER — Encounter: Payer: Self-pay | Admitting: Obstetrics and Gynecology

## 2015-12-20 ENCOUNTER — Ambulatory Visit (INDEPENDENT_AMBULATORY_CARE_PROVIDER_SITE_OTHER): Payer: Medicare Other | Admitting: Obstetrics and Gynecology

## 2015-12-20 VITALS — BP 125/77 | HR 66 | Resp 18 | Ht 73.0 in | Wt 143.8 lb

## 2015-12-20 DIAGNOSIS — N390 Urinary tract infection, site not specified: Secondary | ICD-10-CM | POA: Diagnosis not present

## 2015-12-20 LAB — MICROSCOPIC EXAMINATION: RBC, UA: NONE SEEN /hpf (ref 0–?)

## 2015-12-20 LAB — URINALYSIS, COMPLETE
BILIRUBIN UA: NEGATIVE
GLUCOSE, UA: NEGATIVE
Ketones, UA: NEGATIVE
LEUKOCYTES UA: NEGATIVE
Nitrite, UA: NEGATIVE
PH UA: 5.5 (ref 5.0–7.5)
RBC UA: NEGATIVE
SPEC GRAV UA: 1.015 (ref 1.005–1.030)
UUROB: 0.2 mg/dL (ref 0.2–1.0)

## 2015-12-20 LAB — BLADDER SCAN AMB NON-IMAGING

## 2015-12-20 NOTE — Progress Notes (Signed)
12/20/2015 3:56 PM   Kyle Short 07/02/1946 [id number]  Referring provider: Luciana Axe, NP [place], [place] [address]  Chief Complaint  Patient presents with  . Establish Care  . Dysuria    HPI: Patient is a 70 year old male with a history of Alzheimer's dementia, cardiomyopathy, congestive heart failure, MI and CKD presenting today with a staff member from [facility] where he is a resident. He is referred by his primary care provider for recurrent urinary tract infections.  Patient himself denies any urinary symptoms. Staff member reports that patient is incontinent at baseline and wears adult absorbent briefs at all times. She states that he occasionally will get up and go to use the restroom himself. He does experience a lot of nighttime incontinence.  He states that he has not experienced any fevers, flank pain, gross hematuria, urinary frequency or urgency.  No previous history of prostate issues.  He does not drink much water.   PVR 66mL today..  Last anitbiotic finished a week ago.  Urine Cultures: 07/29/15 Pseudomonas aeruginosa 80,000 colonies 10/14/15 Pseudomonas aeruginsa >100,000 colonies 12/02/15 Providencia stuartii >100,000   PMH: Past Medical History  Diagnosis Date  . Hypertension   . Lives in group home   . Hyperlipidemia   . Stroke ([place])   . Alcohol abuse   . Chronic kidney disease (CKD) stage G3b/A1, moderately decreased glomerular filtration rate (GFR) between 30-44 mL/min/1.73 square meter and albuminuria creatinine ratio less than 30 mg/g   . Anemia of chronic disease   . Alzheimer's dementia with behavioral disturbance   . Cardiomyopathy due to hypertension, without heart failure ([place])   . Myocardial infarction [facility])     Surgical History: Past Surgical History  Procedure Laterality Date  . Tonsillectomy      Home Medications:    Medication List       This list is accurate as of: 12/20/15  3:56 PM.  Always  use your most recent med list.               aspirin 81 MG chewable tablet  Commonly known as:  ASPIRIN CHILDRENS  Chew 1 tablet (81 mg total) by mouth daily.     atorvastatin 10 MG tablet  Commonly known as:  LIPITOR  Take 1 tablet (10 mg total) by mouth daily.     bismuth subsalicylate 99991111 MG chewable tablet  Commonly known as:  PEPTO BISMOL  Chew 262 mg by mouth daily as needed for indigestion or diarrhea or loose stools.     carvedilol 12.5 MG tablet  Commonly known as:  COREG  Take 1 tablet (12.5 mg total) by mouth 2 (two) times daily with a meal.     Cholecalciferol 2000 units Caps  Commonly known as:  HM VITAMIN D3  Take 1 capsule (2,000 Units total) by mouth daily.     hydrALAZINE 25 MG tablet  Commonly known as:  APRESOLINE  Take 1 tablet (25 mg total) by mouth 4 (four) times daily.     isosorbide mononitrate 30 MG 24 hr tablet  Commonly known as:  IMDUR  Take 1 tablet (30 mg total) by mouth daily.     lisinopril 10 MG tablet  Commonly known as:  PRINIVIL,ZESTRIL  Take 1 tablet (10 mg total) by mouth daily.     Melatonin 10 MG Caps  Take 10 mg by mouth at bedtime as needed.        Allergies: No Known Allergies  Family History: Family History  Problem Relation Age of Onset  . Hypertension Other   . Hypertension Mother   . Diabetes Father     Social History:  reports that he has been smoking Cigarettes.  He has been smoking about 0.25 packs per day. He has never used smokeless tobacco. He reports that he does not drink alcohol or use illicit drugs.  ROS: UROLOGY Frequent Urination?: No Hard to postpone urination?: No Burning/pain with urination?: No Get up at night to urinate?: No Leakage of urine?: No Urine stream starts and stops?: No Trouble starting stream?: No Do you have to strain to urinate?: No Blood in urine?: No Urinary tract infection?: Yes Sexually transmitted disease?: No Injury to kidneys or bladder?: Yes Painful intercourse?:  No Weak stream?: No Erection problems?: No Penile pain?: No  Gastrointestinal Nausea?: No Vomiting?: No Indigestion/heartburn?: No Diarrhea?: No Constipation?: Yes  Constitutional Fever: No Night sweats?: No Weight loss?: No Fatigue?: No  Skin Skin rash/lesions?: No Itching?: No  Eyes Blurred vision?: No Double vision?: No  Ears/Nose/Throat Sore throat?: No Sinus problems?: No  Hematologic/Lymphatic Swollen glands?: No Easy bruising?: Yes  Cardiovascular Leg swelling?: No Chest pain?: No  Respiratory Cough?: No Shortness of breath?: No  Endocrine Excessive thirst?: No  Musculoskeletal Back pain?: No Joint pain?: No  Neurological Headaches?: No Dizziness?: No  Psychologic Depression?: Yes Anxiety?: No  Physical Exam: BP 125/77 mmHg  Pulse 66  Resp 18  Ht 6\' 1"  (1.854 m)  Wt 143 lb 12.8 oz (65.227 kg)  BMI 18.98 kg/m2  Constitutional:  Alert and oriented, No acute distress. HEENT: [place] AT, moist mucus membranes.  Trachea midline, no masses. Cardiovascular: No clubbing, cyanosis, or edema. Respiratory: Normal respiratory effort, no increased work of breathing. GI: Abdomen is soft, nontender, nondistended, no abdominal masses GU: No CVA tenderness. Uncircumcised phallus with easily retractable foreskin, testicles atrophic and descended bilaterally DRE: patient was unable to cooperate and DRE was unable to be performed. Skin: No rashes, bruises or suspicious lesions. Lymph: No cervical or inguinal adenopathy. Neurologic: Grossly intact, no focal deficits, moving all 4 extremities. Psychiatric: Normal mood and affect.  Laboratory Data:   Urinalysis Results for orders placed or performed in visit on 12/20/15  BLADDER SCAN AMB NON-IMAGING  Result Value Ref Range   Scan Result 22 mL    Pertinent Imaging:   Assessment & Plan:    1. Recurrent UTI- UTI prevention strategies discussed.  Good perineal hygiene reviewed. Patient is encouraged to  increase daily water intake, start cranberry supplements to prevent invasive colonization along the urinary tract and probiotics, especially lactobacillus to restore normal vaginal flora. Upon further questioning does not appear that patient has been retracting his foreskin when providing urine specimens. It is unclear whether previous cultures for results of skin contamination. Patient is currently asymptomatic though as he is not a reliable historian. His PVR was minimal today and it appears that he is able to empty his bladder well. I was unable to perform a digital rectal examination today so it is unclear whether patient has BPH. He will return in 1 week and provide [place] with a clean catch urine specimen. I reviewed instructions in detail with him and the staff member present today. Circumcision may be considered in the future if patient continues to have recurrent urinary tract infections. - Urinalysis, Complete - BLADDER SCAN AMB NON-IMAGING   Return in about 2 weeks (around 01/03/2016).  These notes generated with voice recognition software. I apologize for  typographical errors.  Herbert Moors, [facility] [address], [place], [place] [address] [phone]

## 2016-01-06 ENCOUNTER — Ambulatory Visit: Payer: Self-pay | Admitting: Obstetrics and Gynecology

## 2016-01-08 ENCOUNTER — Other Ambulatory Visit: Payer: Self-pay | Admitting: Obstetrics and Gynecology

## 2016-01-08 DIAGNOSIS — N390 Urinary tract infection, site not specified: Secondary | ICD-10-CM

## 2016-01-09 ENCOUNTER — Ambulatory Visit: Payer: Self-pay | Admitting: Obstetrics and Gynecology

## 2016-01-20 ENCOUNTER — Emergency Department: Payer: Medicare Other

## 2016-01-20 ENCOUNTER — Emergency Department
Admission: EM | Admit: 2016-01-20 | Discharge: 2016-01-20 | Disposition: A | Discharge status: Home / Self Care | Payer: Medicare Other | Attending: Emergency Medicine | Admitting: Emergency Medicine

## 2016-01-20 ENCOUNTER — Encounter: Payer: Self-pay | Admitting: Emergency Medicine

## 2016-01-20 DIAGNOSIS — R112 Nausea with vomiting, unspecified: Secondary | ICD-10-CM | POA: Diagnosis not present

## 2016-01-20 DIAGNOSIS — I129 Hypertensive chronic kidney disease with stage 1 through stage 4 chronic kidney disease, or unspecified chronic kidney disease: Secondary | ICD-10-CM | POA: Insufficient documentation

## 2016-01-20 DIAGNOSIS — E1122 Type 2 diabetes mellitus with diabetic chronic kidney disease: Secondary | ICD-10-CM | POA: Insufficient documentation

## 2016-01-20 DIAGNOSIS — F17210 Nicotine dependence, cigarettes, uncomplicated: Secondary | ICD-10-CM | POA: Diagnosis not present

## 2016-01-20 DIAGNOSIS — N184 Chronic kidney disease, stage 4 (severe): Secondary | ICD-10-CM | POA: Diagnosis not present

## 2016-01-20 DIAGNOSIS — R1111 Vomiting without nausea: Secondary | ICD-10-CM | POA: Diagnosis not present

## 2016-01-20 DIAGNOSIS — R1110 Vomiting, unspecified: Secondary | ICD-10-CM | POA: Diagnosis not present

## 2016-01-20 DIAGNOSIS — Z7982 Long term (current) use of aspirin: Secondary | ICD-10-CM | POA: Diagnosis not present

## 2016-01-20 DIAGNOSIS — Z79899 Other long term (current) drug therapy: Secondary | ICD-10-CM | POA: Diagnosis not present

## 2016-01-20 DIAGNOSIS — R55 Syncope and collapse: Secondary | ICD-10-CM

## 2016-01-20 LAB — URINALYSIS COMPLETE WITH MICROSCOPIC (ARMC ONLY)
BILIRUBIN URINE: NEGATIVE
Glucose, UA: NEGATIVE mg/dL
HGB URINE DIPSTICK: NEGATIVE
KETONES UR: NEGATIVE mg/dL
NITRITE: POSITIVE — AB
PH: 5 (ref 5.0–8.0)
Protein, ur: NEGATIVE mg/dL
Specific Gravity, Urine: 1.016 (ref 1.005–1.030)

## 2016-01-20 LAB — COMPREHENSIVE METABOLIC PANEL
ALBUMIN: 3.5 g/dL (ref 3.5–5.0)
ALK PHOS: 58 U/L (ref 38–126)
ALT: 19 U/L (ref 17–63)
ANION GAP: 6 (ref 5–15)
AST: 27 U/L (ref 15–41)
BUN: 36 mg/dL — ABNORMAL HIGH (ref 6–20)
CALCIUM: 9.1 mg/dL (ref 8.9–10.3)
CHLORIDE: 112 mmol/L — AB (ref 101–111)
CO2: 22 mmol/L (ref 22–32)
Creatinine, Ser: 2.96 mg/dL — ABNORMAL HIGH (ref 0.61–1.24)
GFR calc non Af Amer: 20 mL/min — ABNORMAL LOW (ref >60)
GFR, EST AFRICAN AMERICAN: 24 mL/min — AB (ref >60)
GLUCOSE: 149 mg/dL — AB (ref 65–99)
POTASSIUM: 4.9 mmol/L (ref 3.5–5.1)
SODIUM: 140 mmol/L (ref 135–145)
Total Bilirubin: 0.3 mg/dL (ref 0.3–1.2)
Total Protein: 7.7 g/dL (ref 6.5–8.1)

## 2016-01-20 LAB — TROPONIN I
TROPONIN I: 0.07 ng/mL — AB (ref <0.031)
Troponin I: 0.06 ng/mL — ABNORMAL HIGH (ref <0.031)

## 2016-01-20 LAB — CBC WITH DIFFERENTIAL/PLATELET
BASOS ABS: 0 10*3/uL (ref 0–0.1)
BASOS PCT: 1 %
EOS ABS: 0.2 10*3/uL (ref 0–0.7)
EOS PCT: 4 %
HCT: 34 % — ABNORMAL LOW (ref 40.0–52.0)
Hemoglobin: 10.5 g/dL — ABNORMAL LOW (ref 13.0–18.0)
Lymphocytes Relative: 40 %
Lymphs Abs: 1.8 10*3/uL (ref 1.0–3.6)
MCH: 27.6 pg (ref 26.0–34.0)
MCHC: 30.9 g/dL — AB (ref 32.0–36.0)
MCV: 89.3 fL (ref 80.0–100.0)
MONO ABS: 0.7 10*3/uL (ref 0.2–1.0)
MONOS PCT: 15 %
NEUTROS ABS: 1.8 10*3/uL (ref 1.4–6.5)
Neutrophils Relative %: 40 %
PLATELETS: 200 10*3/uL (ref 150–440)
RBC: 3.81 MIL/uL — ABNORMAL LOW (ref 4.40–5.90)
RDW: 14.7 % — AB (ref 11.5–14.5)
WBC: 4.5 10*3/uL (ref 3.8–10.6)

## 2016-01-20 NOTE — ED Notes (Signed)
Up to BR for stool, no dizzyness, patient states unable to urinate at this time.

## 2016-01-20 NOTE — ED Notes (Signed)
Per report patient was in front of assisted living, had nausea and vomiting and syncopal episode. Pt diaphoretic on scene. Arrives awake, skin warm and dry, trouble answering questions however history of dementia. Denies chest pain, nausea or SOB on arrival.

## 2016-01-20 NOTE — ED Provider Notes (Signed)
[facility] Emergency Department Provider Note  ____________________________________________  Time seen: Approximately 115 PM  I have reviewed the triage vital signs and the nursing notes.   HISTORY  Chief Complaint Loss of Consciousness    HPI Kyle Short is a 70 y.o. male with a history of chronic kidney disease who is presenting today after syncopal episode. Per EMS, the patient was sitting outside, when he lost consciousness. There was associated vomiting but it is unclear if this is before after the episode. At this time the patient has no complaints. Is unable to recall the events of earlier today. However, he denies any chest pain. Says he feels normal at this time without any nausea.   Past Medical History  Diagnosis Date  . Hypertension   . Lives in group home   . Hyperlipidemia   . Stroke ([place])   . Alcohol abuse   . Chronic kidney disease (CKD) stage G3b/A1, moderately decreased glomerular filtration rate (GFR) between 30-44 mL/min/1.73 square meter and albuminuria creatinine ratio less than 30 mg/g   . Anemia of chronic disease   . Alzheimer's dementia with behavioral disturbance   . Cardiomyopathy due to hypertension, without heart failure ([place])   . Myocardial infarction [facility])     Patient Active Problem List   Diagnosis Date Noted  . Secondary cardiomyopathy ([place]) 10/14/2015  . Myocardial infarction ([place]) 10/14/2015  . UTI (urinary tract infection) 09/12/2015  . Orthostatic hypotension 09/11/2015  . Syncope 09/11/2015  . Chronic kidney disease, stage 4, severely decreased GFR ([facility]) 07/26/2015  . Benign essential HTN 07/26/2015  . 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 H/O alcohol abuse 07/25/2015  . HLD (hyperlipidemia) 07/25/2015  . Cerebrovascular accident, old 07/25/2015  . Sepsis ([place]) 07/06/2015  . UTI (lower urinary tract infection) 07/06/2015  . Acute on chronic renal failure ([place])  07/06/2015  . Dementia, Alzheimer's, with behavior disturbance 06/07/2015  . Cardiomyopathy ([place]) 06/07/2015  . Compulsive tobacco user syndrome 02/26/2015  . Current tobacco use 02/26/2015    Past Surgical History  Procedure Laterality Date  . Tonsillectomy      Current Outpatient Rx  Name  Route  Sig  Dispense  Refill  . aspirin (ASPIRIN CHILDRENS) 81 MG chewable tablet   Oral   Chew 1 tablet (81 mg total) by mouth daily.   30 tablet   3   . atorvastatin (LIPITOR) 10 MG tablet   Oral   Take 1 tablet (10 mg total) by mouth daily.   30 tablet   4   . bismuth subsalicylate (PEPTO BISMOL) 262 MG chewable tablet   Oral   Chew 262 mg by mouth daily as needed for indigestion or diarrhea or loose stools.         . carvedilol (COREG) 12.5 MG tablet   Oral   Take 1 tablet (12.5 mg total) by mouth 2 (two) times daily with a meal.   60 tablet   1   . Cholecalciferol (HM VITAMIN D3) 2000 UNITS CAPS   Oral   Take 1 capsule (2,000 Units total) by mouth daily.   30 each   11   . hydrALAZINE (APRESOLINE) 25 MG tablet   Oral   Take 1 tablet (25 mg total) by mouth 4 (four) times daily.   120 tablet   2     Pt needs appt for additional refills.   . isosorbide mononitrate (IMDUR) 30 MG 24 hr tablet   Oral  Take 1 tablet (30 mg total) by mouth daily.   30 tablet   11   . lisinopril (PRINIVIL,ZESTRIL) 10 MG tablet   Oral   Take 1 tablet (10 mg total) by mouth daily.   30 tablet   4   . Melatonin 10 MG CAPS   Oral   Take 10 mg by mouth at bedtime as needed.   30 capsule   11     Allergies Review of patient's allergies indicates no known allergies.  Family History  Problem Relation Age of Onset  . Hypertension Other   . Hypertension Mother   . Diabetes Father     Social History Social History  Substance Use Topics  . Smoking status: Current Every Day Smoker -- 0.25 packs/day    Types: Cigarettes  . Smokeless tobacco: Never Used  . Alcohol Use: No     Review of Systems Constitutional: No fever/chills Eyes: No visual changes. ENT: No sore throat. Cardiovascular: Denies chest pain. Respiratory: Denies shortness of breath. Gastrointestinal: No abdominal pain.  No nausea, no vomiting.  No diarrhea.  No constipation. Genitourinary: Negative for dysuria. Musculoskeletal: Negative for back pain. Skin: Negative for rash. Neurological: Negative for headaches, focal weakness or numbness.  10-point ROS otherwise negative.  ____________________________________________   PHYSICAL EXAM:  VITAL SIGNS: ED Triage Vitals  Enc Vitals Group     BP 01/20/16 1130 125/85 mmHg     Pulse Rate 01/20/16 1130 57     Resp 01/20/16 1130 18     Temp 01/20/16 1130 97.8 F (36.6 C)     Temp Source 01/20/16 1130 Oral     SpO2 01/20/16 1130 100 %     Weight 01/20/16 1130 146 lb (66.225 kg)     Height 01/20/16 1130 6\' 1"  (1.854 m)     Head Cir --      Peak Flow --      Pain Score --      Pain Loc --      Pain Edu? --      Excl. in [place]? --     Constitutional: Alert and oriented. Well appearing and in no acute distress. Eyes: Conjunctivae are normal. PERRL. EOMI. Head: Atraumatic. Nose: No congestion/rhinnorhea. Mouth/Throat: Mucous membranes are moist.   Neck: No stridor.   Cardiovascular: Normal rate, regular rhythm. Grossly normal heart sounds.  Good peripheral circulation. Respiratory: Normal respiratory effort.  No retractions. Lungs CTAB. Gastrointestinal: Soft and nontender. No distention. No abdominal bruits. No CVA tenderness. Musculoskeletal: No lower extremity tenderness nor edema.  No joint effusions. Neurologic:  Normal speech and language. No gross focal neurologic deficits are appreciated. No gait instability. Skin:  Skin is warm, dry and intact. No rash noted. Psychiatric: Mood and affect are normal. Speech and behavior are normal.  ____________________________________________   LABS (all labs ordered are listed, but only  abnormal results are displayed)  Labs Reviewed  CBC WITH DIFFERENTIAL/PLATELET - Abnormal; Notable for the following:    RBC 3.81 (*)    Hemoglobin 10.5 (*)    HCT 34.0 (*)    MCHC 30.9 (*)    RDW 14.7 (*)    All other components within normal limits  TROPONIN I - Abnormal; Notable for the following:    Troponin I 0.07 (*)    All other components within normal limits  COMPREHENSIVE METABOLIC PANEL - Abnormal; Notable for the following:    Chloride 112 (*)    Glucose, Bld 149 (*)    BUN 36 (*)  Creatinine, Ser 2.96 (*)    GFR calc non Af Amer 20 (*)    GFR calc Af Amer 24 (*)    All other components within normal limits  URINALYSIS COMPLETEWITH MICROSCOPIC ([facility] ONLY)  TROPONIN I   ____________________________________________  EKG  ED ECG REPORT I, Nashira Mcglynn,  Youlanda Roys, the attending physician, personally viewed and interpreted this ECG.   Date: 01/20/2016  EKG Time: 11:39 AM  Rate: 59  Rhythm: normal sinus rhythm  Axis: Normal axis  Intervals:none  ST&T Change: No ST segment elevation or depression. T-wave inversions in 23, aVF  ED ECG REPORT I, Katena Petitjean,  Youlanda Roys, the attending physician, personally viewed and interpreted this ECG.   Date: 01/20/2016  EKG Time: 1159  Rate: 57  Rhythm: normal sinus rhythm  Axis: Normal axis  Intervals:none  ST&T Change: No ST elevation or depression. T-wave inversions in 2, 3, aVF  Nose no change from previous EKGs. ____________________________________________  RADIOLOGY  No active cardiopulmonary disease. ____________________________________________   PROCEDURES   ____________________________________________   INITIAL IMPRESSION / ASSESSMENT AND PLAN / ED COURSE  Pertinent labs & imaging results that were available during my care of the patient were reviewed by me and considered in my medical decision making (see chart for details).  Patient with what sounds like could be a vasovagal episode. Vomiting  associated with syncope. Labs appear to be at baseline. We will recheck his troponin.  ----------------------------------------- 4:23 PM on 01/20/2016 -----------------------------------------  Patient continues to be without complaint. Pending second troponin at this time. Signed out to Dr. Cinda Quest.  ____________________________________________   FINAL CLINICAL IMPRESSION(S) / ED DIAGNOSES  Syncope. Vomiting.    Orbie Pyo, MD 01/20/16 [phone]

## 2016-01-20 NOTE — Discharge Instructions (Signed)
Syncope °Syncope is a medical term for fainting or passing out. This means you lose consciousness and drop to the ground. People are generally unconscious for less than 5 minutes. You may have some muscle twitches for up to 15 seconds before waking up and returning to normal. Syncope occurs more often in older adults, but it can happen to anyone. While most causes of syncope are not dangerous, syncope can be a sign of a serious medical problem. It is important to seek medical care.  °CAUSES  °Syncope is caused by a sudden drop in blood flow to the brain. The specific cause is often not determined. Factors that can bring on syncope include: °· Taking medicines that lower blood pressure. °· Sudden changes in posture, such as standing up quickly. °· Taking more medicine than prescribed. °· Standing in one place for too long. °· Seizure disorders. °· Dehydration and excessive exposure to heat. °· Low blood sugar (hypoglycemia). °· Straining to have a bowel movement. °· Heart disease, irregular heartbeat, or other circulatory problems. °· Fear, emotional distress, seeing blood, or severe pain. °SYMPTOMS  °Right before fainting, you may: °· Feel dizzy or light-headed. °· Feel nauseous. °· See all white or all black in your field of vision. °· Have cold, clammy skin. °DIAGNOSIS  °Your health care provider will ask about your symptoms, perform a physical exam, and perform an electrocardiogram (ECG) to record the electrical activity of your heart. Your health care provider may also perform other heart or blood tests to determine the cause of your syncope which may include: °· Transthoracic echocardiogram (TTE). During echocardiography, sound waves are used to evaluate how blood flows through your heart. °· Transesophageal echocardiogram (TEE). °· Cardiac monitoring. This allows your health care provider to monitor your heart rate and rhythm in real time. °· Holter monitor. This is a portable device that records your  heartbeat and can help diagnose heart arrhythmias. It allows your health care provider to track your heart activity for several days, if needed. °· Stress tests by exercise or by giving medicine that makes the heart beat faster. °TREATMENT  °In most cases, no treatment is needed. Depending on the cause of your syncope, your health care provider may recommend changing or stopping some of your medicines. °HOME CARE INSTRUCTIONS °· Have someone stay with you until you feel stable. °· Do not drive, use machinery, or play sports until your health care provider says it is okay. °· Keep all follow-up appointments as directed by your health care provider. °· Lie down right away if you start feeling like you might faint. Breathe deeply and steadily. Wait until all the symptoms have passed. °· Drink enough fluids to keep your urine clear or pale yellow. °· If you are taking blood pressure or heart medicine, get up slowly and take several minutes to sit and then stand. This can reduce dizziness. °SEEK IMMEDIATE MEDICAL CARE IF:  °· You have a severe headache. °· You have unusual pain in the chest, abdomen, or back. °· You are bleeding from your mouth or rectum, or you have black or tarry stool. °· You have an irregular or very fast heartbeat. °· You have pain with breathing. °· You have repeated fainting or seizure-like jerking during an episode. °· You faint when sitting or lying down. °· You have confusion. °· You have trouble walking. °· You have severe weakness. °· You have vision problems. °If you fainted, call your local emergency services (911 in [place] Do not drive   yourself to the hospital.    This information is not intended to replace advice given to you by your health care provider. Make sure you discuss any questions you have with your health care provider.   Document Released: 11/30/2005 Document Revised: 04/16/2015 Document Reviewed: 01/29/2012 Elsevier Interactive Patient Education 2016 Elsevier  Inc.  Nausea and Vomiting Nausea means you feel sick to your stomach. Throwing up (vomiting) is a reflex where stomach contents come out of your mouth. HOME CARE   Take medicine as told by your doctor.  Do not force yourself to eat. However, you do need to drink fluids.  If you feel like eating, eat a normal diet as told by your doctor.  Eat rice, wheat, potatoes, bread, lean meats, yogurt, fruits, and vegetables.  Avoid high-fat foods.  Drink enough fluids to keep your pee (urine) clear or pale yellow.  Ask your doctor how to replace body fluid losses (rehydrate). Signs of body fluid loss (dehydration) include:  Feeling very thirsty.  Dry lips and mouth.  Feeling dizzy.  Dark pee.  Peeing less than normal.  Feeling confused.  Fast breathing or heart rate. GET HELP RIGHT AWAY IF:   You have blood in your throw up.  You have black or bloody poop (stool).  You have a bad headache or stiff neck.  You feel confused.  You have bad belly (abdominal) pain.  You have chest pain or trouble breathing.  You do not pee at least once every 8 hours.  You have cold, clammy skin.  You keep throwing up after 24 to 48 hours.  You have a fever. MAKE SURE YOU:   Understand these instructions.  Will watch your condition.  Will get help right away if you are not doing well or get worse.   This information is not intended to replace advice given to you by your health care provider. Make sure you discuss any questions you have with your health care provider.   Document Released: 05/18/2008 Document Revised: 02/22/2012 Document Reviewed: 05/01/2011 Elsevier Interactive Patient Education [employer].

## 2016-01-20 NOTE — ED Provider Notes (Signed)
Second troponin is less than the first one. Both of these are within the range of normal for him. Patient continues to do well in the emergency room. Will discharge as planned by Dr. Dineen Kid.  Nena Polio, MD 01/20/16 [phone]

## 2016-01-21 DIAGNOSIS — H2513 Age-related nuclear cataract, bilateral: Secondary | ICD-10-CM | POA: Diagnosis not present

## 2016-01-21 LAB — HM DIABETES EYE EXAM

## 2016-02-19 ENCOUNTER — Other Ambulatory Visit: Payer: Self-pay | Admitting: Family Medicine

## 2016-02-19 MED ORDER — ATORVASTATIN CALCIUM 10 MG PO TABS: 10.0000 mg | ORAL_TABLET | Freq: Every day | ORAL | Status: DC

## 2016-02-19 MED ORDER — CARVEDILOL 12.5 MG PO TABS: 12.5000 mg | ORAL_TABLET | Freq: Two times a day (BID) | ORAL | Status: DC

## 2016-03-04 ENCOUNTER — Other Ambulatory Visit: Payer: Self-pay | Admitting: Family Medicine

## 2016-03-04 MED ORDER — HYDRALAZINE HCL 25 MG PO TABS: 25.0000 mg | ORAL_TABLET | Freq: Four times a day (QID) | ORAL | Status: DC

## 2016-03-25 ENCOUNTER — Other Ambulatory Visit: Payer: Self-pay | Admitting: Family Medicine

## 2016-03-25 DIAGNOSIS — I420 Dilated cardiomyopathy: Secondary | ICD-10-CM | POA: Diagnosis not present

## 2016-03-25 DIAGNOSIS — R55 Syncope and collapse: Secondary | ICD-10-CM | POA: Diagnosis not present

## 2016-03-25 DIAGNOSIS — I429 Cardiomyopathy, unspecified: Secondary | ICD-10-CM | POA: Diagnosis not present

## 2016-03-25 DIAGNOSIS — E782 Mixed hyperlipidemia: Secondary | ICD-10-CM | POA: Diagnosis not present

## 2016-03-25 DIAGNOSIS — I10 Essential (primary) hypertension: Secondary | ICD-10-CM | POA: Diagnosis not present

## 2016-03-25 DIAGNOSIS — N184 Chronic kidney disease, stage 4 (severe): Secondary | ICD-10-CM

## 2016-03-26 MED ORDER — LISINOPRIL 10 MG PO TABS: 10.0000 mg | ORAL_TABLET | Freq: Every day | ORAL | Status: DC

## 2016-03-26 MED ORDER — ASPIRIN 81 MG PO CHEW: 81.0000 mg | CHEWABLE_TABLET | Freq: Every day | ORAL | Status: DC

## 2016-03-31 ENCOUNTER — Encounter: Admission: RE | Payer: Self-pay | Source: Ambulatory Visit

## 2016-03-31 ENCOUNTER — Ambulatory Visit: Admission: RE | Admit: 2016-03-31 | Payer: Medicare Other | Source: Ambulatory Visit | Admitting: Cardiology

## 2016-03-31 SURGERY — LOOP RECORDER INSERTION
Anesthesia: LOCAL

## 2016-04-14 ENCOUNTER — Encounter: Payer: Self-pay | Admitting: Family Medicine

## 2016-04-14 ENCOUNTER — Ambulatory Visit (INDEPENDENT_AMBULATORY_CARE_PROVIDER_SITE_OTHER): Payer: Medicare Other | Admitting: Family Medicine

## 2016-04-14 VITALS — BP 123/74 | HR 57 | Temp 97.6°F | Resp 16 | Ht 73.0 in | Wt 147.8 lb

## 2016-04-14 DIAGNOSIS — F0281 Dementia in other diseases classified elsewhere with behavioral disturbance: Secondary | ICD-10-CM

## 2016-04-14 DIAGNOSIS — Z720 Tobacco use: Secondary | ICD-10-CM | POA: Diagnosis not present

## 2016-04-14 DIAGNOSIS — E1122 Type 2 diabetes mellitus with diabetic chronic kidney disease: Secondary | ICD-10-CM | POA: Diagnosis not present

## 2016-04-14 DIAGNOSIS — N184 Chronic kidney disease, stage 4 (severe): Secondary | ICD-10-CM | POA: Diagnosis not present

## 2016-04-14 DIAGNOSIS — I10 Essential (primary) hypertension: Secondary | ICD-10-CM | POA: Diagnosis not present

## 2016-04-14 DIAGNOSIS — E119 Type 2 diabetes mellitus without complications: Secondary | ICD-10-CM | POA: Insufficient documentation

## 2016-04-14 DIAGNOSIS — Z1211 Encounter for screening for malignant neoplasm of colon: Secondary | ICD-10-CM

## 2016-04-14 DIAGNOSIS — I429 Cardiomyopathy, unspecified: Secondary | ICD-10-CM

## 2016-04-14 DIAGNOSIS — E785 Hyperlipidemia, unspecified: Secondary | ICD-10-CM | POA: Diagnosis not present

## 2016-04-14 DIAGNOSIS — G308 Other Alzheimer's disease: Secondary | ICD-10-CM | POA: Diagnosis not present

## 2016-04-14 DIAGNOSIS — N390 Urinary tract infection, site not specified: Secondary | ICD-10-CM | POA: Diagnosis not present

## 2016-04-14 DIAGNOSIS — G309 Alzheimer's disease, unspecified: Secondary | ICD-10-CM

## 2016-04-14 DIAGNOSIS — Z8673 Personal history of transient ischemic attack (TIA), and cerebral infarction without residual deficits: Secondary | ICD-10-CM

## 2016-04-14 DIAGNOSIS — F02818 Dementia in other diseases classified elsewhere, unspecified severity, with other behavioral disturbance: Secondary | ICD-10-CM

## 2016-04-14 LAB — POCT GLYCOSYLATED HEMOGLOBIN (HGB A1C): HEMOGLOBIN A1C: 6.5

## 2016-04-14 NOTE — Assessment & Plan Note (Signed)
Continues on statin. Check lipids.

## 2016-04-14 NOTE — Assessment & Plan Note (Signed)
Recurrent UTI's- encouraged caregiver make f/u appt with BUA since it was missed in February. Pt unable to give urine sample today.

## 2016-04-14 NOTE — Assessment & Plan Note (Signed)
Statin for secondary prevention. Sees neurology once yearly.

## 2016-04-14 NOTE — Assessment & Plan Note (Signed)
Oriented x3 today. At baseline. No longer cheeking medications. Continue to monitor.

## 2016-04-14 NOTE — Assessment & Plan Note (Signed)
Encouraged smoking cessation 

## 2016-04-14 NOTE — Assessment & Plan Note (Signed)
A1c is elevated. Pt choosing not to treat with medication due to chronic health conditions and dementia. Eye exam UTD. Foot exam UTD. UA microalbumin is negative.  Encouraged diet and lifestyle changes to control. Recheck in 3 mos.

## 2016-04-14 NOTE — Patient Instructions (Signed)
Please reduce sugar in the diet and walk for 10 minutes daily.

## 2016-04-14 NOTE — Assessment & Plan Note (Signed)
Managed by Dr. Kristin Bruins- recheck kidney function due to decrease in Feb. Next Appt June 2017.

## 2016-04-14 NOTE — Assessment & Plan Note (Signed)
Managed by cardiology- Dr. Ubaldo Glassing.

## 2016-04-14 NOTE — Assessment & Plan Note (Signed)
Controlled. Continue current regimen. Check CMET today.

## 2016-04-14 NOTE — Progress Notes (Signed)
Subjective:    Patient ID: Kyle Short, male    DOB: 1946-07-26, 70 y.o.   MRN: [id number]  HPI: Kyle Short is a 70 y.o. male presenting on 04/14/2016 for [facility] paperwork; Diabetes; Hyperlipidemia; and Hypertension   HPI  Pt presents for follow-up of diabetes, HTN, and alzheimers. Needs his FL2 paperwork recertified.  Recent episode of syncope- is scheduled for loop recorder with cardiology. Seen in ER on 2/7 for syncopal episode.  Diabetes: A1c is elevated to 6.5%- has had increased sugars in his coffee. Does not want to treat with medications due to comorbidities. Had eye exam with no retinopathy 01/21/2016. UA microalbumin negative in 11/2015.  CKD stage 3: Seeing Dr. Kristin Bruins for CKD stage 3. Next follow-up in June 30. Did have decrease in kidney function in ER on 2/7- will recheck today.  HTN: Controlled at facility. Taking current medications as ordered. No chest pain, SOB, or visual changes.  Hyperlipidemia- Continues to take 10mg  atorvastatin daily. Eats heart healthy diet at facility.  Recurrent UTI's saw- urology but missed follow-up due to hospitalization. Unable to give urine sample today. Denies dysuria or blood in urine.  Alzheimers- At baseline. At times pleasantly confused. No behavioral disturbance per caregiver. Pt is doing better taking daily medications.    Past Medical History  Diagnosis Date  . Hypertension   . Lives in group home   . Hyperlipidemia   . Stroke ([place])   . Alcohol abuse   . Chronic kidney disease (CKD) stage G3b/A1, moderately decreased glomerular filtration rate (GFR) between 30-44 mL/min/1.73 square meter and albuminuria creatinine ratio less than 30 mg/g   . Anemia of chronic disease   . Alzheimer's dementia with behavioral disturbance   . Cardiomyopathy due to hypertension, without heart failure ([place])   . Myocardial infarction [facility])     Current Outpatient Prescriptions on File Prior to Visit  Medication Sig  . aspirin (ASPIRIN CHILDRENS)  81 MG chewable tablet Chew 1 tablet (81 mg total) by mouth daily.  Marland Kitchen atorvastatin (LIPITOR) 10 MG tablet Take 1 tablet (10 mg total) by mouth daily.  Marland Kitchen bismuth subsalicylate (PEPTO BISMOL) 262 MG chewable tablet Chew 262 mg by mouth daily as needed for indigestion or diarrhea or loose stools.  . carvedilol (COREG) 12.5 MG tablet Take 1 tablet (12.5 mg total) by mouth 2 (two) times daily with a meal.  . Cholecalciferol (HM VITAMIN D3) 2000 UNITS CAPS Take 1 capsule (2,000 Units total) by mouth daily.  . hydrALAZINE (APRESOLINE) 25 MG tablet Take 1 tablet (25 mg total) by mouth 4 (four) times daily.  . isosorbide mononitrate (IMDUR) 30 MG 24 hr tablet Take 1 tablet (30 mg total) by mouth daily.  Marland Kitchen lisinopril (PRINIVIL,ZESTRIL) 10 MG tablet Take 1 tablet (10 mg total) by mouth daily.  . Melatonin 10 MG CAPS Take 10 mg by mouth at bedtime as needed.   No current facility-administered medications on file prior to visit.    Review of Systems  Constitutional: Negative for fever and chills.  HENT: Negative.   Eyes: Negative for visual disturbance.  Respiratory: Negative for chest tightness, shortness of breath and wheezing.   Cardiovascular: Negative for chest pain, palpitations and leg swelling.  Gastrointestinal: Negative for nausea, vomiting and abdominal pain.  Endocrine: Negative.  Negative for polydipsia, polyphagia and polyuria.  Genitourinary: Negative for dysuria, urgency, discharge, penile pain and testicular pain.  Musculoskeletal: Negative for back pain, joint swelling and arthralgias.  Skin: Negative.   Allergic/Immunologic: Negative.  Neurological: Positive for syncope (last episode 2/7- followed by cardiology). Negative for dizziness, weakness, numbness and headaches.  Psychiatric/Behavioral: Positive for confusion. Negative for sleep disturbance and dysphoric mood.   Per HPI unless specifically indicated above     Objective:    BP 123/74 mmHg  Pulse 57  Temp(Src) 97.6 F  (36.4 C) (Oral)  Resp 16  Ht 6\' 1"  (1.854 m)  Wt 147 lb 12.8 oz (67.042 kg)  BMI 19.50 kg/m2  Wt Readings from Last 3 Encounters:  04/14/16 147 lb 12.8 oz (67.042 kg)  01/20/16 146 lb (66.225 kg)  12/20/15 143 lb 12.8 oz (65.227 kg)    Physical Exam  Constitutional: He is oriented to person, place, and time. He appears well-developed and well-nourished. No distress.  HENT:  Head: Normocephalic and atraumatic.  Neck: Neck supple. No thyromegaly present.  Cardiovascular: Normal rate, regular rhythm and normal heart sounds.  Exam reveals no gallop and no friction rub.   No murmur heard. Pulmonary/Chest: Effort normal and breath sounds normal. He has no wheezes.  Abdominal: Soft. Bowel sounds are normal. He exhibits no distension. There is no tenderness. There is no rebound and no CVA tenderness.  Musculoskeletal: Normal range of motion. He exhibits no edema or tenderness.  Neurological: He is alert and oriented to person, place, and time. He has normal strength and normal reflexes. He displays a negative Romberg sign.  Reflex Scores:      Patellar reflexes are 2+ on the right side and 2+ on the left side. Skin: Skin is warm and dry. No rash noted. No erythema.  Psychiatric: He has a normal mood and affect. His behavior is normal. Thought content normal.   Results for orders placed or performed in visit on 04/14/16  POCT HgB A1C  Result Value Ref Range   Hemoglobin A1C 6.5       Assessment & Plan:   Problem List Items Addressed This Visit      Cardiovascular and Mediastinum   Cardiomyopathy [facility])    Managed by cardiology- Dr. Ubaldo Glassing.       Benign essential HTN    Controlled. Continue current regimen. Check CMET today.         Endocrine   Type 2 diabetes mellitus with chronic kidney disease (HCC)    A1c is elevated. Pt choosing not to treat with medication due to chronic health conditions and dementia. Eye exam UTD. Foot exam UTD. UA microalbumin is negative.  Encouraged  diet and lifestyle changes to control. Recheck in 3 mos.       Relevant Orders   POCT HgB A1C (Completed)   Comprehensive metabolic panel   Lipid Profile     Nervous and Auditory   Dementia, Alzheimer's, with behavior disturbance    Oriented x3 today. At baseline. No longer cheeking medications. Continue to monitor.         Genitourinary   UTI (lower urinary tract infection)    Recurrent UTI's- encouraged caregiver make f/u appt with BUA since it was missed in February. Pt unable to give urine sample today.      Chronic kidney disease, stage 4, severely decreased GFR (HCC)    Managed by Dr. Kristin Bruins- recheck kidney function due to decrease in Feb. Next Appt June 2017.         Other   HLD (hyperlipidemia)    Continues on statin. Check lipids.       Cerebrovascular accident, old    Statin for secondary prevention. Sees neurology once  yearly.       Current tobacco use - Primary    Encouraged smoking cessation.        Other Visit Diagnoses    Screening for colon cancer        Relevant Orders    Cologuard       Meds ordered this encounter  Medications  . DISCONTD: acetaminophen (RA ACETAMINOPHEN) 650 MG CR tablet    Sig: Take by mouth.      Follow up plan: Return in about 3 months (around 07/15/2016) for diabetes. Marland Kitchen

## 2016-04-15 LAB — LIPID PANEL
Chol/HDL Ratio: 1.9 ratio units (ref 0.0–5.0)
Cholesterol, Total: 160 mg/dL (ref 100–199)
HDL: 85 mg/dL (ref >39)
LDL Calculated: 52 mg/dL (ref 0–99)
TRIGLYCERIDES: 113 mg/dL (ref 0–149)
VLDL Cholesterol Cal: 23 mg/dL (ref 5–40)

## 2016-04-15 LAB — COMPREHENSIVE METABOLIC PANEL
A/G RATIO: 1.1 — AB (ref 1.2–2.2)
ALK PHOS: 76 IU/L (ref 39–117)
ALT: 18 IU/L (ref 0–44)
AST: 21 IU/L (ref 0–40)
Albumin: 4.1 g/dL (ref 3.6–4.8)
BILIRUBIN TOTAL: 0.2 mg/dL (ref 0.0–1.2)
BUN/Creatinine Ratio: 10 (ref 10–24)
BUN: 28 mg/dL — ABNORMAL HIGH (ref 8–27)
CALCIUM: 9.5 mg/dL (ref 8.6–10.2)
CHLORIDE: 104 mmol/L (ref 96–106)
CO2: 20 mmol/L (ref 18–29)
Creatinine, Ser: 2.79 mg/dL — ABNORMAL HIGH (ref 0.76–1.27)
GFR calc Af Amer: 26 mL/min/{1.73_m2} — ABNORMAL LOW (ref >59)
GFR calc non Af Amer: 22 mL/min/{1.73_m2} — ABNORMAL LOW (ref >59)
Globulin, Total: 3.7 g/dL (ref 1.5–4.5)
Glucose: 109 mg/dL — ABNORMAL HIGH (ref 65–99)
POTASSIUM: 4.7 mmol/L (ref 3.5–5.2)
Sodium: 143 mmol/L (ref 134–144)
Total Protein: 7.8 g/dL (ref 6.0–8.5)

## 2016-06-23 DIAGNOSIS — R55 Syncope and collapse: Secondary | ICD-10-CM | POA: Diagnosis not present

## 2016-06-23 DIAGNOSIS — I429 Cardiomyopathy, unspecified: Secondary | ICD-10-CM | POA: Diagnosis not present

## 2016-06-23 DIAGNOSIS — F0390 Unspecified dementia without behavioral disturbance: Secondary | ICD-10-CM | POA: Diagnosis not present

## 2016-06-23 DIAGNOSIS — Z720 Tobacco use: Secondary | ICD-10-CM | POA: Diagnosis not present

## 2016-06-23 DIAGNOSIS — I10 Essential (primary) hypertension: Secondary | ICD-10-CM | POA: Diagnosis not present

## 2016-06-23 DIAGNOSIS — E782 Mixed hyperlipidemia: Secondary | ICD-10-CM | POA: Diagnosis not present

## 2016-07-13 ENCOUNTER — Other Ambulatory Visit: Payer: Self-pay | Admitting: Family Medicine

## 2016-07-14 ENCOUNTER — Telehealth: Payer: Self-pay | Admitting: Family Medicine

## 2016-07-14 NOTE — Telephone Encounter (Signed)
Junious Dresser advised that Rx send for 120 tab yesterday.

## 2016-07-14 NOTE — Telephone Encounter (Signed)
As per [place] didn't receive Hydralazine and she also requested for Lipitor so verbal was given to the pharmacy.

## 2016-07-14 NOTE — Telephone Encounter (Signed)
Pt needs a refill on hydralazine HCL 25 mg sent to [facility].  Stella's call back number is [phone]

## 2016-08-04 ENCOUNTER — Encounter: Payer: Self-pay | Admitting: Family Medicine

## 2016-08-04 ENCOUNTER — Ambulatory Visit (INDEPENDENT_AMBULATORY_CARE_PROVIDER_SITE_OTHER): Payer: Medicare Other | Admitting: Family Medicine

## 2016-08-04 VITALS — BP 124/70 | HR 54 | Temp 97.7°F | Resp 16 | Ht 73.0 in | Wt 144.8 lb

## 2016-08-04 DIAGNOSIS — Z720 Tobacco use: Secondary | ICD-10-CM | POA: Diagnosis not present

## 2016-08-04 DIAGNOSIS — F0281 Dementia in other diseases classified elsewhere with behavioral disturbance: Secondary | ICD-10-CM

## 2016-08-04 DIAGNOSIS — E1122 Type 2 diabetes mellitus with diabetic chronic kidney disease: Secondary | ICD-10-CM | POA: Diagnosis not present

## 2016-08-04 DIAGNOSIS — N184 Chronic kidney disease, stage 4 (severe): Secondary | ICD-10-CM | POA: Diagnosis not present

## 2016-08-04 DIAGNOSIS — I10 Essential (primary) hypertension: Secondary | ICD-10-CM

## 2016-08-04 DIAGNOSIS — G309 Alzheimer's disease, unspecified: Secondary | ICD-10-CM

## 2016-08-04 DIAGNOSIS — F02818 Dementia in other diseases classified elsewhere, unspecified severity, with other behavioral disturbance: Secondary | ICD-10-CM

## 2016-08-04 DIAGNOSIS — I429 Cardiomyopathy, unspecified: Secondary | ICD-10-CM | POA: Diagnosis not present

## 2016-08-04 DIAGNOSIS — G308 Other Alzheimer's disease: Secondary | ICD-10-CM

## 2016-08-04 LAB — BASIC METABOLIC PANEL WITH GFR
BUN: 40 mg/dL — ABNORMAL HIGH (ref 7–25)
CALCIUM: 9.5 mg/dL (ref 8.6–10.3)
CHLORIDE: 107 mmol/L (ref 98–110)
CO2: 24 mmol/L (ref 20–31)
CREATININE: 3.16 mg/dL — AB (ref 0.70–1.25)
GFR, Est African American: 22 mL/min — ABNORMAL LOW (ref >=60)
GFR, Est Non African American: 19 mL/min — ABNORMAL LOW (ref >=60)
GLUCOSE: 148 mg/dL — AB (ref 65–99)
Potassium: 4.5 mmol/L (ref 3.5–5.3)
Sodium: 139 mmol/L (ref 135–146)

## 2016-08-04 LAB — POCT UA - MICROALBUMIN: MICROALBUMIN (UR) POC: 0 mg/L

## 2016-08-04 LAB — POCT GLYCOSYLATED HEMOGLOBIN (HGB A1C): Hemoglobin A1C: 6.4

## 2016-08-04 MED ORDER — HYDRALAZINE HCL 25 MG PO TABS: 25.0000 mg | ORAL_TABLET | Freq: Four times a day (QID) | ORAL | 5 refills | Status: DC

## 2016-08-04 NOTE — Assessment & Plan Note (Signed)
Encouraged smoking cessation 

## 2016-08-04 NOTE — Assessment & Plan Note (Signed)
Pt is followed by Dr. Kristin Bruins for kidney disease. Caregiver is unsure when he was last seen. Last GFR <25.  Will recheck BMP today. Avoid NSAIDs. Encouraged close follow-up with nephrology.

## 2016-08-04 NOTE — Assessment & Plan Note (Signed)
Diet controlled. Diabetic foot exam UTD. Urine Microalbumin done today and followed by nephrology.  Eye exam was done in Feb 2017- due Feb 2018.  Recheck 3 mos.

## 2016-08-04 NOTE — Progress Notes (Signed)
Subjective:    Patient ID: Kyle Short, male    DOB: 12/27/1945, 70 y.o.   MRN: [id number]  HPI: Kyle Short is a 70 y.o. male presenting on 08/04/2016 for Diabetes (highest BS 130 and lowest BS 119)   HPI  Pt presents for diabetes follow-up. A1c is 6.4% today. Last was 6.5%. He is diet controlled at this time. Avg BG is 130.  Eating diabetic diet. No numbness or tingling in feet.  Sees a Kidney specialist for CKD stage 4.  Last seen Dr. Kristin Bruins in June?  Saw cardiology in July 2017- doing well with CHF. Will having follow-up in 6 mos. No more syncopal episodes- have cancelled loop recorder at this time.  Dementia: Caregiver reports he is taking medications better. No wondering behaviors.  HTN: Blood pressure is doing well.   Past Medical History:  Diagnosis Date  . Alcohol abuse   . Alzheimer's dementia with behavioral disturbance   . Anemia of chronic disease   . Cardiomyopathy due to hypertension, without heart failure ([place])   . Chronic kidney disease (CKD) stage G3b/A1, moderately decreased glomerular filtration rate (GFR) between 30-44 mL/min/1.73 square meter and albuminuria creatinine ratio less than 30 mg/g   . Hyperlipidemia   . Hypertension   . Lives in group home   . Myocardial infarction ([place])   . Stroke [facility])     Current Outpatient Prescriptions on File Prior to Visit  Medication Sig  . aspirin (ASPIRIN CHILDRENS) 81 MG chewable tablet Chew 1 tablet (81 mg total) by mouth daily.  Marland Kitchen atorvastatin (LIPITOR) 10 MG tablet Take 1 tablet (10 mg total) by mouth daily.  Marland Kitchen bismuth subsalicylate (PEPTO BISMOL) 262 MG chewable tablet Chew 262 mg by mouth daily as needed for indigestion or diarrhea or loose stools.  . carvedilol (COREG) 12.5 MG tablet Take 1 tablet (12.5 mg total) by mouth 2 (two) times daily with a meal.  . Cholecalciferol (HM VITAMIN D3) 2000 UNITS CAPS Take 1 capsule (2,000 Units total) by mouth daily.  . isosorbide mononitrate (IMDUR) 30 MG 24 hr  tablet Take 1 tablet (30 mg total) by mouth daily.  Marland Kitchen lisinopril (PRINIVIL,ZESTRIL) 10 MG tablet Take 1 tablet (10 mg total) by mouth daily.  . Melatonin 10 MG CAPS Take 10 mg by mouth at bedtime as needed.   No current facility-administered medications on file prior to visit.     Review of Systems  Constitutional: Negative for chills and fever.  HENT: Negative.   Respiratory: Negative for chest tightness, shortness of breath and wheezing.   Cardiovascular: Negative for chest pain, palpitations and leg swelling.  Gastrointestinal: Negative for abdominal pain, nausea and vomiting.  Endocrine: Negative.   Genitourinary: Negative for discharge, dysuria, penile pain, testicular pain and urgency.  Musculoskeletal: Negative for arthralgias, back pain and joint swelling.  Skin: Negative.   Neurological: Negative for dizziness, weakness, numbness and headaches.  Psychiatric/Behavioral: Negative for dysphoric mood and sleep disturbance.   Per HPI unless specifically indicated above     Objective:    BP 124/70 (BP Location: Right Arm, Patient Position: Sitting, Cuff Size: Normal)   Pulse (!) 54   Temp 97.7 F (36.5 C) (Oral)   Resp 16   Ht 6\' 1"  (1.854 m)   Wt 144 lb 12.8 oz (65.7 kg)   BMI 19.10 kg/m   Wt Readings from Last 3 Encounters:  08/04/16 144 lb 12.8 oz (65.7 kg)  04/14/16 147 lb 12.8 oz (67 kg)  01/20/16  146 lb (66.2 kg)    Physical Exam  Constitutional: He appears well-developed and well-nourished. No distress.  HENT:  Head: Normocephalic and atraumatic.  Neck: Neck supple. No thyromegaly present.  Cardiovascular: Normal rate, regular rhythm and normal heart sounds.  Exam reveals no gallop and no friction rub.   No murmur heard. Pulmonary/Chest: Effort normal and breath sounds normal. He has no wheezes.  Abdominal: Soft. Bowel sounds are normal. He exhibits no distension. There is no tenderness. There is no rebound.  Musculoskeletal: Normal range of motion. He  exhibits no edema or tenderness.  Neurological: He is alert. He has normal reflexes. He is disoriented (disorented to time- patient baseline.).  Skin: Skin is warm and dry. No rash noted. No erythema.  Psychiatric: He has a normal mood and affect. His speech is normal and behavior is normal. Judgment and thought content normal. He exhibits abnormal recent memory.  Poor short term memory 2/2 dementia   Diabetic Foot Exam - Simple   Simple Foot Form Diabetic Foot exam was performed with the following findings:  Yes 08/04/2016 11:45 AM  Visual Inspection No deformities, no ulcerations, no other skin breakdown bilaterally:  Yes Sensation Testing Intact to touch and monofilament testing bilaterally:  Yes Pulse Check Posterior Tibialis and Dorsalis pulse intact bilaterally:  Yes Comments     Results for orders placed or performed in visit on 08/04/16  POCT HgB A1C  Result Value Ref Range   Hemoglobin A1C 6.4       Assessment & Plan:   Problem List Items Addressed This Visit      Cardiovascular and Mediastinum   Cardiomyopathy ([place])    Followed by Dr. Ubaldo Glassing. Last seen in July. Overall doing well. Will be seen q6 mos by cardiology.       Relevant Medications   hydrALAZINE (APRESOLINE) 25 MG tablet   Benign essential HTN    Controlled with current regimen.  No medication changes today. Will check BMP for kidney function. Recheck 3 mos.       Relevant Medications   hydrALAZINE (APRESOLINE) 25 MG tablet     Endocrine   Type 2 diabetes mellitus with chronic kidney disease ([place]) - Primary    Diet controlled. Diabetic foot exam UTD. Urine Microalbumin done today and followed by nephrology.  Eye exam was done in Feb 2017- due Feb 2018.  Recheck 3 mos.       Relevant Orders   POCT HgB A1C (Completed)   BASIC METABOLIC PANEL WITH GFR   POCT UA - Microalbumin     Nervous and Auditory   Dementia, Alzheimer's, with behavior disturbance    Stable. Taking medications better at this  time. Has seen neurology in the past. Due to troubles taking medications have chosen not to add Aricept. No wandering behaviors noted by caregiver. Safety precautions reviewed.         Genitourinary   Chronic kidney disease, stage 4, severely decreased GFR (HCC)    Pt is followed by Dr. Kristin Bruins for kidney disease. Caregiver is unsure when he was last seen. Last GFR <25.  Will recheck BMP today. Avoid NSAIDs. Encouraged close follow-up with nephrology.         Other   Current tobacco use    Encouraged smoking cessation.        Other Visit Diagnoses   None.     Meds ordered this encounter  Medications  . hydrALAZINE (APRESOLINE) 25 MG tablet    Sig: Take 1 tablet (25  mg total) by mouth 4 (four) times daily.    Dispense:  120 tablet    Refill:  5    Order Specific Question:   Supervising Provider    Answer:   Arlis Porta [id number]      Follow up plan: Return in about 3 months (around 11/04/2016), or if symptoms worsen or fail to improve.

## 2016-08-04 NOTE — Patient Instructions (Signed)
Continue diabetic diet. We will check in on Kidney function today.   Your goal blood pressure is 140/90. Work on low salt/sodium diet - goal <1.5gm (1,500mg ) per day. Eat a diet high in fruits/vegetables and whole grains.  Look into mediterranean and DASH diet. Goal activity is 170min/wk of moderate intensity exercise.  This can be split into 30 minute chunks.  If you are not at this level, you can start with smaller 10-15 min increments and slowly build up activity. Look at [email or web address] for more resources

## 2016-08-04 NOTE — Assessment & Plan Note (Signed)
Stable. Taking medications better at this time. Has seen neurology in the past. Due to troubles taking medications have chosen not to add Aricept. No wandering behaviors noted by caregiver. Safety precautions reviewed.

## 2016-08-04 NOTE — Assessment & Plan Note (Signed)
Followed by Dr. Ubaldo Glassing. Last seen in July. Overall doing well. Will be seen q6 mos by cardiology.

## 2016-08-04 NOTE — Assessment & Plan Note (Signed)
Controlled with current regimen.  No medication changes today. Will check BMP for kidney function. Recheck 3 mos.

## 2016-09-02 ENCOUNTER — Telehealth: Payer: Self-pay | Admitting: Family Medicine

## 2016-09-02 ENCOUNTER — Other Ambulatory Visit: Payer: Self-pay | Admitting: Family Medicine

## 2016-09-02 DIAGNOSIS — N184 Chronic kidney disease, stage 4 (severe): Secondary | ICD-10-CM

## 2016-09-02 DIAGNOSIS — I10 Essential (primary) hypertension: Secondary | ICD-10-CM

## 2016-09-02 MED ORDER — LISINOPRIL 10 MG PO TABS
10.0000 mg | ORAL_TABLET | Freq: Every day | ORAL | 11 refills | Status: DC
Start: 2016-09-02 — End: 2016-10-30

## 2016-09-02 MED ORDER — ASPIRIN 81 MG PO CHEW: 81.0000 mg | CHEWABLE_TABLET | Freq: Every day | ORAL | 11 refills | Status: DC

## 2016-09-02 NOTE — Telephone Encounter (Signed)
Pt needs refills on chewable aspirin 81 mg and lisinopril 10 mg sent to [facility].  Her call back number is [phone]

## 2016-10-30 ENCOUNTER — Ambulatory Visit (INDEPENDENT_AMBULATORY_CARE_PROVIDER_SITE_OTHER): Payer: Medicare Other | Admitting: Family Medicine

## 2016-10-30 ENCOUNTER — Encounter: Payer: Self-pay | Admitting: Family Medicine

## 2016-10-30 VITALS — BP 133/81 | HR 63 | Temp 98.4°F | Resp 16 | Ht 73.0 in | Wt 144.6 lb

## 2016-10-30 DIAGNOSIS — E1122 Type 2 diabetes mellitus with diabetic chronic kidney disease: Secondary | ICD-10-CM | POA: Diagnosis not present

## 2016-10-30 DIAGNOSIS — N184 Chronic kidney disease, stage 4 (severe): Secondary | ICD-10-CM | POA: Diagnosis not present

## 2016-10-30 DIAGNOSIS — E782 Mixed hyperlipidemia: Secondary | ICD-10-CM | POA: Diagnosis not present

## 2016-10-30 DIAGNOSIS — F0280 Dementia in other diseases classified elsewhere without behavioral disturbance: Secondary | ICD-10-CM

## 2016-10-30 DIAGNOSIS — G309 Alzheimer's disease, unspecified: Secondary | ICD-10-CM

## 2016-10-30 DIAGNOSIS — I10 Essential (primary) hypertension: Secondary | ICD-10-CM | POA: Diagnosis not present

## 2016-10-30 MED ORDER — CARVEDILOL 12.5 MG PO TABS
12.5000 mg | ORAL_TABLET | Freq: Two times a day (BID) | ORAL | 11 refills | Status: DC
Start: 2016-10-30 — End: 2017-02-10

## 2016-10-30 MED ORDER — ATORVASTATIN CALCIUM 10 MG PO TABS: 10.0000 mg | ORAL_TABLET | Freq: Every day | ORAL | 11 refills | Status: DC

## 2016-10-30 MED ORDER — HYDRALAZINE HCL 25 MG PO TABS: 25.0000 mg | ORAL_TABLET | Freq: Four times a day (QID) | ORAL | 11 refills | Status: DC

## 2016-10-30 NOTE — Patient Instructions (Signed)
Thank you for coming in to clinic today.  1. Refilled medications today, let me know if you need any other medications 2. BP and weight look good today. No new concerns 3. He does have diagnosis Type 2 Diabetes, but is well controlled with just diet and lifestyle. No changes.  You will be due for FASTING BLOOD WORK (no food or drink after midnight before, only water or coffee without cream/sugar on the morning of)  - Please go ahead and schedule a "Lab Only" visit in the morning at the clinic for lab draw in 6 MONTHS  - Make sure Lab Only appointment is at least 1-2 weeks before your next appointment, so that results will be available  Please schedule a follow-up appointment with Dr. Parks Ranger in 6 months, after 04/14/17 for Annual Physical, DM, HTN, HLD, dementia, lab review  If you have any other questions or concerns, please feel free to call the clinic or send a message through [place]. You may also schedule an earlier appointment if necessary.  Nobie Putnam, DO [place]

## 2016-10-30 NOTE — Progress Notes (Signed)
Subjective:    Patient ID: Kyle Short, male    DOB: 08-06-1946, 70 y.o.   MRN: [id number]  Kyle Short is a 70 y.o. male presenting on 10/30/2016 for Diabetes  6 month follow-up, not due for lab work. History provided by primary caregiver of assisted care home, [place] Junious Dresser, [employer]all[employer])  HPI   Nephrology - Dr Ilda Mori, managing CKD-IV Cardiology - Dr Ubaldo Glassing, managing CAD s/p MI, Cardiomyopathy, h/o syncopal episodes Awaiting Dentist for dentures  CHRONIC DM, Type 2: Reports no concerns, well controlled problem never on DM medications or insulin. A1c has been stable < 7.0 CBGs: Does not check CBG Meds: None Currently on ACEi Complication with sensory ataxia per Neurology Denies hypoglycemia, polyuria, visual changes, numbness or tingling.  Alzheimer's Dementia without behavioral disturbance / H/o CVA: - Previously followed by Neurology. Currently under care at a family care home receives daily assistance with ADLs, needs full assist for dressing, showering. He is independent with feeding. Limited function with mobility, able to do short walks, family visits frequently. Prior CVA likely contributing to dementia, no known residual weakness, but some balance difficulty  HYPERLIPIDEMIA: - Reports no concerns. Last lipid panel 04/14/2016, controlled, next due 04/14/17 - Currently taking atorvastatin 10mg , tolerating well without side effects or myalgias  CHRONIC HTN: Reports no concerns. BP checked outside office. Current Meds - Lisinopril 10, Hydralazine 25 QID, Coreg 12.5 BID   Reports good compliance, took meds today. Tolerating well, w/o complaints. Lifestyle - Limited activity, some walking with assistance Denies CP, dyspnea, HA, edema, dizziness / lightheadedness   Social History  Substance Use Topics  . Smoking status: Current Every Day Smoker    Packs/day: 0.25    Types: Cigarettes  . Smokeless tobacco: Current User  . Alcohol  use No    Review of Systems Per HPI unless specifically indicated above     Objective:    BP 133/81   Pulse 63   Temp 98.4 F (36.9 C) (Oral)   Resp 16   Ht 6\' 1"  (1.854 m)   Wt 144 lb 9.6 oz (65.6 kg)   BMI 19.08 kg/m   Wt Readings from Last 3 Encounters:  10/30/16 144 lb 9.6 oz (65.6 kg)  08/04/16 144 lb 12.8 oz (65.7 kg)  04/14/16 147 lb 12.8 oz (67 kg)    Physical Exam  Constitutional: He appears well-developed and well-nourished. No distress.  Elderly appearing 70 year male, currently well-appearing, comfortable, cooperative. Unable to get arm out of sleeve without assistance.  HENT:  Head: Normocephalic and atraumatic.  Mouth/Throat: Oropharynx is clear and moist.  Poor dentition without any permanent teeth. No dentures currently  Eyes: Conjunctivae and EOM are normal. Pupils are equal, round, and reactive to light.  Arcus senilis bilateral  Neck: Normal range of motion. Neck supple. No thyromegaly present.  Cardiovascular: Normal rate, regular rhythm, normal heart sounds and intact distal pulses.   No murmur heard. Pulmonary/Chest: Effort normal and breath sounds normal. No respiratory distress. He has no wheezes. He has no rales.  Musculoskeletal: Normal range of motion. He exhibits no edema or tenderness.  Lymphadenopathy:    He has no cervical adenopathy.  Neurological: He is alert. Coordination normal.  Skin: Skin is warm and dry. No rash noted. He is not diaphoretic.  Psychiatric: He has a normal mood and affect. His behavior is normal.  Limited verbal during history, speech is appropriate but sparse. Participates in exam and follows commands well.  Limited insight into condition.  Nursing note and vitals reviewed.   Results for orders placed or performed in visit on 84/69/62  BASIC METABOLIC PANEL WITH GFR  Result Value Ref Range   Sodium 139 135 - 146 mmol/L   Potassium 4.5 3.5 - 5.3 mmol/L   Chloride 107 98 - 110 mmol/L   CO2 24 20 - 31 mmol/L    Glucose, Bld 148 (H) 65 - 99 mg/dL   BUN 40 (H) 7 - 25 mg/dL   Creat 3.16 (H) 0.70 - 1.25 mg/dL   Calcium 9.5 8.6 - 10.3 mg/dL   GFR, Est African American 22 (L) >=60 mL/min   GFR, Est Non African American 19 (L) >=60 mL/min  POCT HgB A1C  Result Value Ref Range   Hemoglobin A1C 6.4   POCT UA - Microalbumin  Result Value Ref Range   Microalbumin Ur, POC 0 mg/L   Creatinine, POC  mg/dL   Albumin/Creatinine Ratio, Urine, POC        Assessment & Plan:   Problem List Items Addressed This Visit    Type 2 diabetes mellitus with chronic kidney disease (HCC)    Well controlled, A1c < 7.0 for >1 year now. Diet lifestyle controlled without medications. - UTD on DM monitoring foot / eye exams, microalbumin (already on ACEi) - Complication CKD-IV, sensory ataxia  Plan: 1. Continue lifestyle with DM diet 2. Continue statin, ASA 3. Defer A1c today given well control, will check q 6 mo for now, next due approx 04/2017 with other labs, lipid, chemistry       Relevant Medications   atorvastatin (LIPITOR) 10 MG tablet   HLD (hyperlipidemia) - Primary    Well controlled lipids on statin. No change today, refill sent Follow-up 6 mo for repeat fasting lipids      Relevant Medications   atorvastatin (LIPITOR) 10 MG tablet   carvedilol (COREG) 12.5 MG tablet   hydrALAZINE (APRESOLINE) 25 MG tablet   Benign essential HTN    Well controlled, within goal Followed by Cardiology, Nephrology  Plan: 1. No medication changes today, refilled Coreg, Hydralazine, Lisinopril 2. Not due for labs today 3. Follow-up q 6 mo      Relevant Medications   atorvastatin (LIPITOR) 10 MG tablet   carvedilol (COREG) 12.5 MG tablet   hydrALAZINE (APRESOLINE) 25 MG tablet   Alzheimer's dementia without behavioral disturbance    Stable without evidence of worsening. Considered FAST Stage 6, dependent on most ADLs, but can feed independently by report. Has some mobility but is limited. - No concerns of  wandering, disruptive or violent behavior - Not on dementia medications  Plan: 1. Continue current care at family care home with close supervision 2. Follow-up Neurology as indicated if worsening          Meds ordered this encounter  Medications  . atorvastatin (LIPITOR) 10 MG tablet    Sig: Take 1 tablet (10 mg total) by mouth daily.    Dispense:  30 tablet    Refill:  11  . carvedilol (COREG) 12.5 MG tablet    Sig: Take 1 tablet (12.5 mg total) by mouth 2 (two) times daily with a meal.    Dispense:  60 tablet    Refill:  11  . hydrALAZINE (APRESOLINE) 25 MG tablet    Sig: Take 1 tablet (25 mg total) by mouth 4 (four) times daily.    Dispense:  120 tablet    Refill:  11  Follow up plan: Return in about 6 months (around 04/29/2017) for Annual Physical, DM, HTN, HLD, dementia, lab review.  Nobie Putnam, DO [facility] 11/01/2016, 9:35 AM

## 2016-11-01 NOTE — Assessment & Plan Note (Signed)
Well controlled, A1c < 7.0 for >1 year now. Diet lifestyle controlled without medications. - UTD on DM monitoring foot / eye exams, microalbumin (already on ACEi) - Complication CKD-IV, sensory ataxia  Plan: 1. Continue lifestyle with DM diet 2. Continue statin, ASA 3. Defer A1c today given well control, will check q 6 mo for now, next due approx 04/2017 with other labs, lipid, chemistry

## 2016-11-01 NOTE — Assessment & Plan Note (Signed)
Well controlled, within goal Followed by Cardiology, Nephrology  Plan: 1. No medication changes today, refilled Coreg, Hydralazine, Lisinopril 2. Not due for labs today 3. Follow-up q 6 mo

## 2016-11-01 NOTE — Assessment & Plan Note (Signed)
Stable without evidence of worsening. Considered FAST Stage 6, dependent on most ADLs, but can feed independently by report. Has some mobility but is limited. - No concerns of wandering, disruptive or violent behavior - Not on dementia medications  Plan: 1. Continue current care at family care home with close supervision 2. Follow-up Neurology as indicated if worsening

## 2016-11-01 NOTE — Assessment & Plan Note (Signed)
Well controlled lipids on statin. No change today, refill sent Follow-up 6 mo for repeat fasting lipids

## 2016-11-09 ENCOUNTER — Other Ambulatory Visit: Payer: Self-pay | Admitting: Family Medicine

## 2016-11-09 DIAGNOSIS — I10 Essential (primary) hypertension: Secondary | ICD-10-CM

## 2016-11-09 MED ORDER — ISOSORBIDE MONONITRATE ER 30 MG PO TB24: 30.0000 mg | ORAL_TABLET | Freq: Every day | ORAL | 5 refills | Status: DC

## 2016-11-11 ENCOUNTER — Ambulatory Visit: Payer: Self-pay | Admitting: Family Medicine

## 2016-11-24 ENCOUNTER — Other Ambulatory Visit: Payer: Self-pay | Admitting: Family Medicine

## 2016-11-24 MED ORDER — CHOLECALCIFEROL 50 MCG (2000 UT) PO CAPS: 2000.0000 [IU] | ORAL_CAPSULE | Freq: Every day | ORAL | 11 refills | Status: DC

## 2017-02-02 ENCOUNTER — Ambulatory Visit: Payer: Self-pay | Admitting: Family Medicine

## 2017-02-10 ENCOUNTER — Encounter: Payer: Self-pay | Admitting: Family Medicine

## 2017-02-10 ENCOUNTER — Ambulatory Visit (INDEPENDENT_AMBULATORY_CARE_PROVIDER_SITE_OTHER): Payer: Medicare Other | Admitting: Family Medicine

## 2017-02-10 VITALS — BP 145/84 | HR 58 | Temp 97.8°F | Resp 16 | Ht 73.0 in | Wt 148.2 lb

## 2017-02-10 DIAGNOSIS — I10 Essential (primary) hypertension: Secondary | ICD-10-CM

## 2017-02-10 DIAGNOSIS — G308 Other Alzheimer's disease: Secondary | ICD-10-CM | POA: Diagnosis not present

## 2017-02-10 DIAGNOSIS — E782 Mixed hyperlipidemia: Secondary | ICD-10-CM | POA: Diagnosis not present

## 2017-02-10 DIAGNOSIS — E559 Vitamin D deficiency, unspecified: Secondary | ICD-10-CM | POA: Diagnosis not present

## 2017-02-10 DIAGNOSIS — Z593 Problems related to living in residential institution: Secondary | ICD-10-CM

## 2017-02-10 DIAGNOSIS — N184 Chronic kidney disease, stage 4 (severe): Secondary | ICD-10-CM

## 2017-02-10 DIAGNOSIS — I429 Cardiomyopathy, unspecified: Secondary | ICD-10-CM

## 2017-02-10 DIAGNOSIS — F0281 Dementia in other diseases classified elsewhere with behavioral disturbance: Secondary | ICD-10-CM

## 2017-02-10 DIAGNOSIS — I639 Cerebral infarction, unspecified: Secondary | ICD-10-CM | POA: Insufficient documentation

## 2017-02-10 DIAGNOSIS — F02818 Dementia in other diseases classified elsewhere, unspecified severity, with other behavioral disturbance: Secondary | ICD-10-CM

## 2017-02-10 DIAGNOSIS — E1122 Type 2 diabetes mellitus with diabetic chronic kidney disease: Secondary | ICD-10-CM | POA: Diagnosis not present

## 2017-02-10 DIAGNOSIS — Z111 Encounter for screening for respiratory tuberculosis: Secondary | ICD-10-CM | POA: Diagnosis not present

## 2017-02-10 DIAGNOSIS — Z789 Other specified health status: Secondary | ICD-10-CM | POA: Insufficient documentation

## 2017-02-10 MED ORDER — HYDRALAZINE HCL 25 MG PO TABS: 25.0000 mg | ORAL_TABLET | Freq: Four times a day (QID) | ORAL | 3 refills | Status: DC

## 2017-02-10 MED ORDER — CARVEDILOL 12.5 MG PO TABS: 12.5000 mg | ORAL_TABLET | Freq: Two times a day (BID) | ORAL | 3 refills | Status: DC

## 2017-02-10 MED ORDER — MELATONIN 10 MG PO CAPS: 10.0000 mg | ORAL_CAPSULE | Freq: Every evening | ORAL | 3 refills | Status: DC | PRN

## 2017-02-10 MED ORDER — ASPIRIN 81 MG PO CHEW: 81.0000 mg | CHEWABLE_TABLET | Freq: Every day | ORAL | 3 refills | Status: DC

## 2017-02-10 MED ORDER — ATORVASTATIN CALCIUM 10 MG PO TABS
10.0000 mg | ORAL_TABLET | Freq: Every day | ORAL | 3 refills | Status: DC
Start: 2017-02-10 — End: 2017-12-31

## 2017-02-10 MED ORDER — ISOSORBIDE MONONITRATE ER 30 MG PO TB24: 30.0000 mg | ORAL_TABLET | Freq: Every day | ORAL | 3 refills | Status: DC

## 2017-02-10 MED ORDER — CHOLECALCIFEROL 50 MCG (2000 UT) PO CAPS: 2000.0000 [IU] | ORAL_CAPSULE | Freq: Every day | ORAL | 3 refills | Status: DC

## 2017-02-10 MED ORDER — LISINOPRIL 10 MG PO TABS: 10.0000 mg | ORAL_TABLET | Freq: Every day | ORAL | 3 refills | Status: DC

## 2017-02-10 NOTE — Assessment & Plan Note (Signed)
Chronic problem in setting of DM2, HTN Followed by Nephrology Dr Juleen China, to follow-up now newly established at new family care home Future labs ordered for chemistry in 2 months for q 6 mo checks

## 2017-02-10 NOTE — Progress Notes (Signed)
FORM COMPLETION - FL2 (Adult Care Home)  Indication: New FL2 / change of facility Initial start date of services / admission: 01/28/17 Company/Group Home: [facility] [address], [place] [address] (Previously at [facility]) Contact: Melida Quitter (owner) / [phone]  Date of last office visits: 02/10/17, previously 10/30/16  Admitting Diagnosis / Medical Diagnosis List - ICD10 1. Alzheimer's Dementia with behavioral distrubance - G30.8, F02.81 2. Chronic Kidney Disease (CKD) Stage 4 - N18.4 3. Cardiomyopathy / CHF - I42.9 4. Diabetes, Type 2, controlled with complications - G39.43 5. History of cerebrovascular accident / past stroke - I63.9 6. History of Alcohol Abuse - F10.10 7. Current Tobacco Abuse - Z72.0  Patient Information: - Disoriented (Intermittently) - Ambulatory - Incontinent (Bladder, Bowel) - Inappropriate Behavior (Wandering) - Functional Limitations - Communication of needs: Verbal - Respiration: Normal - Personal Care Assistance - Total Care (bathing, feeding, dressing) - Active - Skin Normal - Nutrition: Regular diet  Monthly vitals / weight.  Medication List: - Aspirin 81mg  PO daily - Atorvastatin 10mg  PO daily - Carvedilol 12.5mg  PO BID - Vitamin D3 2,000 iu PO daily - Hydralazine 25mg  PO QID - Imdur 30mg  PO daily - Lisinopril 10mg  PO daily - Melatonin 10mg  PO daily at bedtime PRN  All medications refilled today, 02/10/17, sent to [facility].  Forms completed, signed, dated and given back to group home staff, copied for our chart.  Nobie Putnam, [facility] 02/10/2017, 1:41 PM

## 2017-02-10 NOTE — Assessment & Plan Note (Signed)
Well controlled, A1c < 7.0 for >1 year now. Diet lifestyle controlled without medications. - UTD on DM monitoring foot / eye exams, microalbumin (already on ACEi) - Complication CKD-IV, sensory ataxia  Plan: 1. Continue lifestyle with Regular diet, advised low carb if possible at family care home 2. Continue statin, ASA 3. Future order A1c due approx 04/2017 with other labs, lipid, chemistry 4. Follow-up 2 months

## 2017-02-10 NOTE — Assessment & Plan Note (Signed)
Stable, within goal Followed by Cardiology, Nephrology  Plan: 1. No medication changes today, refilled Coreg, Hydralazine, Lisinopril - sent to [facility], 90 day supplies with +3 refills 2. Not due for labs today - will check future fasting labs in 2 months 3. Follow-up - 2 months, had planned for q 6 month visits, today primarily for [facility] paperwork

## 2017-02-10 NOTE — Assessment & Plan Note (Signed)
New FL2 completed today, scanned into chart.

## 2017-02-10 NOTE — Progress Notes (Addendum)
Subjective:    Patient ID: Kyle Short, male    DOB: 12-11-1946, 71 y.o.   MRN: [id number]  Kyle Short is a 71 y.o. male presenting on 02/10/2017 for FMLA paperwork  Patient accompanied by Melida Quitter (contact, [phone], owner of care home) and Blanch Media, also provides care. He has switched to new care home at [facility] in [place].  Here for new FL2 paperwork for new family care home Requests refill all medications to be sent to [facility]  HPI  Nephrology - Dr Ilda Mori, managing CKD-IV Cardiology - Dr Ubaldo Glassing, managing CAD s/p MI, Cardiomyopathy, h/o syncopal episodes  Alzheimer's Dementia with mild behavioral disturbance / H/o Stroke/Cerebrovascular Accident (CVA): - Reports no new changes or concerns with dementia or behavior. Staff does admit occasional wandering within house at times, along with memory loss. Otherwise no evidence of harmful behavioral disturbances by report. - Receives total care from family care home receives daily assistance with ADLs, needs full assist for dressing, showering, and feeding, in past he can feed himself at times. Limited function with mobility, able to do short walks requires walker usually for longer ambulatory times, family visits frequently. Prior CVA likely contributing to dementia, no known residual weakness, but some balance difficulty - Communication is limited but verbally appropriate - Not followed by Neurology  CHRONIC DM, Type 2: Reports no concerns, well controlled problem never on DM medications or insulin. A1c has been stable < 7.0 CBGs: Does not check CBG - no standing orders for CBG checks given to family care home Meds: None Currently on ACEi Complication with sensory ataxia per Neurology Denies hypoglycemia, polyuria, visual changes, numbness or tingling.  HYPERLIPIDEMIA: - Reports no concerns. Last lipid panel 04/14/2016, controlled, next due 04/14/17 - Currently taking atorvastatin 10mg ,  tolerating well without side effects or myalgias  CHRONIC HTN: Reports no concerns. BP checked outside office with monthly vitals. - Followed by Cardiology, Nephrology Current Meds - Lisinopril 10, Hydralazine 25 QID, Coreg 12.5 BID   Reports good compliance, took meds today. Tolerating well, w/o complaints. Lifestyle - Limited activity, some walking with assistance Denies CP, dyspnea, HA, edema, dizziness / lightheadedness  Due PPD skin test at new facility  Additionally, patient is incontinent of urine/stool, wears protective underwear, continue to be supplied through Medicaid.  Past Medical History:  Diagnosis Date  . Alcohol abuse   . Alzheimer's dementia with behavioral disturbance   . Anemia of chronic disease   . Cardiomyopathy due to hypertension, without heart failure ([place])   . Chronic kidney disease (CKD) stage G3b/A1, moderately decreased glomerular filtration rate (GFR) between 30-44 mL/min/1.73 square meter and albuminuria creatinine ratio less than 30 mg/g   . Hyperlipidemia   . Hypertension   . Lives in group home   . Myocardial infarction   . Stroke [facility])    Past Surgical History:  Procedure Laterality Date  . TONSILLECTOMY     Social History   Social History  . Marital status: Single    Spouse name: N/A  . Number of children: N/A  . Years of education: N/A   Occupational History  . Not on file.   Social History Main Topics  . Smoking status: Current Every Day Smoker    Packs/day: 0.25    Types: Cigarettes  . Smokeless tobacco: Current User  . Alcohol use No  . Drug use: No  . Sexual activity: Not on file   Other Topics Concern  . Not on file  Social History Narrative  . No narrative on file   Family History  Problem Relation Age of Onset  . Hypertension Other   . Hypertension Mother   . Diabetes Father    Current Outpatient Prescriptions on File Prior to Visit  Medication Sig  . bismuth subsalicylate (PEPTO BISMOL) 262 MG chewable  tablet Chew 262 mg by mouth daily as needed for indigestion or diarrhea or loose stools.   No current facility-administered medications on file prior to visit.     Review of Systems Per HPI unless specifically indicated above     Objective:    BP (!) 145/84   Pulse (!) 58   Temp 97.8 F (36.6 C) (Oral)   Resp 16   Ht 6\' 1"  (1.854 m)   Wt 148 lb 3.2 oz (67.2 kg)   BMI 19.55 kg/m   Wt Readings from Last 3 Encounters:  02/10/17 148 lb 3.2 oz (67.2 kg)  10/30/16 144 lb 9.6 oz (65.6 kg)  08/04/16 144 lb 12.8 oz (65.7 kg)    Physical Exam  Constitutional: He appears well-developed and well-nourished. No distress.  Elderly appearing 71 year male, currently well-appearing, comfortable, cooperative.  HENT:  Head: Normocephalic and atraumatic.  Mouth/Throat: Oropharynx is clear and moist.  Poor dentition without any permanent teeth. No dentures currently  Eyes: Conjunctivae and EOM are normal. Pupils are equal, round, and reactive to light.  Arcus senilis bilateral  Cardiovascular: Normal rate, regular rhythm, normal heart sounds and intact distal pulses.   No murmur heard. Pulmonary/Chest: Effort normal and breath sounds normal. No respiratory distress. He has no wheezes. He has no rales.  Musculoskeletal: Normal range of motion. He exhibits no edema.  Neurological: He is alert. Coordination normal.  Skin: Skin is warm and dry. No rash noted. He is not diaphoretic.  Psychiatric: He has a normal mood and affect. His behavior is normal.  Limited verbal during history, speech is appropriate but sparse. Participates in exam and follows commands well. Limited insight into condition.  Nursing note and vitals reviewed.  Results for orders placed or performed in visit on 29/52/84  BASIC METABOLIC PANEL WITH GFR  Result Value Ref Range   Sodium 139 135 - 146 mmol/L   Potassium 4.5 3.5 - 5.3 mmol/L   Chloride 107 98 - 110 mmol/L   CO2 24 20 - 31 mmol/L   Glucose, Bld 148 (H) 65 - 99  mg/dL   BUN 40 (H) 7 - 25 mg/dL   Creat 3.16 (H) 0.70 - 1.25 mg/dL   Calcium 9.5 8.6 - 10.3 mg/dL   GFR, Est African American 22 (L) >=60 mL/min   GFR, Est Non African American 19 (L) >=60 mL/min  POCT HgB A1C  Result Value Ref Range   Hemoglobin A1C 6.4   POCT UA - Microalbumin  Result Value Ref Range   Microalbumin Ur, POC 0 mg/L   Creatinine, POC  mg/dL   Albumin/Creatinine Ratio, Urine, POC        Assessment & Plan:   Problem List Items Addressed This Visit    Type 2 diabetes mellitus with chronic kidney disease (HCC)    Well controlled, A1c < 7.0 for >1 year now. Diet lifestyle controlled without medications. - UTD on DM monitoring foot / eye exams, microalbumin (already on ACEi) - Complication CKD-IV, sensory ataxia  Plan: 1. Continue lifestyle with Regular diet, advised low carb if possible at family care home 2. Continue statin, ASA 3. Future order A1c due approx  04/2017 with other labs, lipid, chemistry 4. Follow-up 2 months      Relevant Medications   lisinopril (PRINIVIL,ZESTRIL) 10 MG tablet   atorvastatin (LIPITOR) 10 MG tablet   aspirin (ASPIRIN LOW DOSE) 81 MG chewable tablet   Other Relevant Orders   Hemoglobin A1c   COMPLETE METABOLIC PANEL WITH GFR   Stroke (HCC)   Relevant Medications   lisinopril (PRINIVIL,ZESTRIL) 10 MG tablet   isosorbide mononitrate (IMDUR) 30 MG 24 hr tablet   hydrALAZINE (APRESOLINE) 25 MG tablet   carvedilol (COREG) 12.5 MG tablet   atorvastatin (LIPITOR) 10 MG tablet   aspirin (ASPIRIN LOW DOSE) 81 MG chewable tablet   Secondary cardiomyopathy (HCC)    Secondary to chronic HTN heart disease and h/o MI with ischemic cardiomyopathy, followed by Cardiology Dr Ubaldo Glassing - Clinically euvolemic - No changes today, refilled current medications ASA 81, Imdur, Coreg, Hydralazine, ACEi      Relevant Medications   lisinopril (PRINIVIL,ZESTRIL) 10 MG tablet   isosorbide mononitrate (IMDUR) 30 MG 24 hr tablet   hydrALAZINE (APRESOLINE)  25 MG tablet   carvedilol (COREG) 12.5 MG tablet   atorvastatin (LIPITOR) 10 MG tablet   aspirin (ASPIRIN LOW DOSE) 81 MG chewable tablet   Lives in group home    New FL2 completed today, scanned into chart.      HLD (hyperlipidemia)    Well controlled lipids on statin. No change today, refill sent Follow-up 2 mo, future fasting lipids ordered.      Relevant Medications   lisinopril (PRINIVIL,ZESTRIL) 10 MG tablet   isosorbide mononitrate (IMDUR) 30 MG 24 hr tablet   hydrALAZINE (APRESOLINE) 25 MG tablet   carvedilol (COREG) 12.5 MG tablet   atorvastatin (LIPITOR) 10 MG tablet   aspirin (ASPIRIN LOW DOSE) 81 MG chewable tablet   Other Relevant Orders   Lipid panel   Chronic kidney disease, stage 4, severely decreased GFR (HCC)    Chronic problem in setting of DM2, HTN Followed by Nephrology Dr Juleen China, to follow-up now newly established at new family care home Future labs ordered for chemistry in 2 months for q 6 mo checks      Relevant Medications   lisinopril (PRINIVIL,ZESTRIL) 10 MG tablet   Benign essential HTN    Stable, within goal Followed by Cardiology, Nephrology  Plan: 1. No medication changes today, refilled Coreg, Hydralazine, Lisinopril - sent to [facility], 90 day supplies with +3 refills 2. Not due for labs today - will check future fasting labs in 2 months 3. Follow-up - 2 months, had planned for q 6 month visits, today primarily for FL2 paperwork      Relevant Medications   lisinopril (PRINIVIL,ZESTRIL) 10 MG tablet   isosorbide mononitrate (IMDUR) 30 MG 24 hr tablet   hydrALAZINE (APRESOLINE) 25 MG tablet   carvedilol (COREG) 12.5 MG tablet   atorvastatin (LIPITOR) 10 MG tablet   aspirin (ASPIRIN LOW DOSE) 81 MG chewable tablet   Other Relevant Orders   COMPLETE METABOLIC PANEL WITH GFR   Alzheimer's dementia with behavioral disturbance - Primary    Stable without evidence of worsening. Considered FAST Stage 6, dependent on most ADLs, but can  feed independently by report. Has some mobility but is limited. - Some concerns with wandering, but not problematic or severe - No concerns disruptive or violent behavior - Not on dementia medications  Plan: 1. Continue current care at family care home with close supervision, total care 2. Follow-up Neurology as indicated if worsening 3.  Updated FL2 today to reflect new [place]      Relevant Orders   COMPLETE METABOLIC PANEL WITH GFR    Other Visit Diagnoses    Essential hypertension       Relevant Medications   lisinopril (PRINIVIL,ZESTRIL) 10 MG tablet   isosorbide mononitrate (IMDUR) 30 MG 24 hr tablet   hydrALAZINE (APRESOLINE) 25 MG tablet   carvedilol (COREG) 12.5 MG tablet   atorvastatin (LIPITOR) 10 MG tablet   aspirin (ASPIRIN LOW DOSE) 81 MG chewable tablet   Vitamin D deficiency       Relevant Medications   Cholecalciferol (HM VITAMIN D3) 2000 units CAPS      Meds ordered this encounter  Medications  . Melatonin 10 MG CAPS    Sig: Take 10 mg by mouth at bedtime as needed.    Dispense:  90 capsule    Refill:  3  . lisinopril (PRINIVIL,ZESTRIL) 10 MG tablet    Sig: Take 1 tablet (10 mg total) by mouth daily.    Dispense:  90 tablet    Refill:  3  . isosorbide mononitrate (IMDUR) 30 MG 24 hr tablet    Sig: Take 1 tablet (30 mg total) by mouth daily.    Dispense:  90 tablet    Refill:  3  . hydrALAZINE (APRESOLINE) 25 MG tablet    Sig: Take 1 tablet (25 mg total) by mouth 4 (four) times daily.    Dispense:  360 tablet    Refill:  3  . Cholecalciferol (HM VITAMIN D3) 2000 units CAPS    Sig: Take 1 capsule (2,000 Units total) by mouth daily.    Dispense:  90 each    Refill:  3  . carvedilol (COREG) 12.5 MG tablet    Sig: Take 1 tablet (12.5 mg total) by mouth 2 (two) times daily with a meal.    Dispense:  180 tablet    Refill:  3  . atorvastatin (LIPITOR) 10 MG tablet    Sig: Take 1 tablet (10 mg total) by mouth daily.    Dispense:  90 tablet    Refill:  3    . aspirin (ASPIRIN LOW DOSE) 81 MG chewable tablet    Sig: Chew 1 tablet (81 mg total) by mouth daily.    Dispense:  90 tablet    Refill:  3    Additionally routine standing orders from [place] signed and submitted back to family care home staff today. Scanned into chart.  Follow up plan: Return in about 2 months (around 04/10/2017) for labs, follow-up.  Nobie Putnam, [facility] 02/10/2017, 1:51 PM

## 2017-02-10 NOTE — Assessment & Plan Note (Addendum)
Secondary to chronic HTN heart disease and h/o MI with ischemic cardiomyopathy, followed by Cardiology Dr Ubaldo Glassing - Clinically euvolemic - No changes today, refilled current medications ASA 81, Imdur, Coreg, Hydralazine, ACEi

## 2017-02-10 NOTE — Assessment & Plan Note (Signed)
Well controlled lipids on statin. No change today, refill sent Follow-up 2 mo, future fasting lipids ordered.

## 2017-02-10 NOTE — Patient Instructions (Signed)
Thank you for coming in to clinic today.  1. TB Skin test today, please have it read and interpreted by RN at your facility or return to ours within 48 hours for official reading  2. Refilled all meds  Completed FL2  You will be due for FASTING BLOOD WORK (no food or drink after midnight before, only water or coffee without cream/sugar on the morning of)  - Please go ahead and schedule a "Lab Only" visit in the morning at the clinic for lab draw in 2 months AFTER 04/14/17 - Make sure Lab Only appointment is at least 1-2 weeks before your next appointment, so that results will be available  For Lab Results, once available within 2-3 days of blood draw, you can can log in to MyChart online to view your results and a brief explanation. Also, we can discuss results at next follow-up visit.   Please schedule a follow-up appointment with Dr. Parks Ranger in 2 months for labs and then 1 week later for follow-up  If you have any other questions or concerns, please feel free to call the clinic or send a message through [place]. You may also schedule an earlier appointment if necessary.  Nobie Putnam, DO [place]

## 2017-02-10 NOTE — Assessment & Plan Note (Addendum)
Stable without evidence of worsening. Considered FAST Stage 6, dependent on most ADLs, but can feed independently by report. Has some mobility but is limited. - Some concerns with wandering, but not problematic or severe - No concerns disruptive or violent behavior - Not on dementia medications  Plan: 1. Continue current care at family care home with close supervision, total care 2. Follow-up Neurology as indicated if worsening 3. Updated FL2 today to reflect new [facility]

## 2017-02-12 LAB — TB SKIN TEST
Induration: 0 mm
TB Skin Test: NEGATIVE

## 2017-02-18 ENCOUNTER — Telehealth: Payer: Self-pay | Admitting: Family Medicine

## 2017-02-18 NOTE — Telephone Encounter (Signed)
Blanch Media was advised that doesn't need referral for routine dentist appointment.

## 2017-02-18 NOTE — Telephone Encounter (Signed)
Kyle Short at [facility] needs a referral faxed to [phone] for a dental appt for a routine visit.  Her call back number is [phone]

## 2017-02-24 ENCOUNTER — Encounter: Payer: Self-pay | Admitting: Family Medicine

## 2017-02-24 ENCOUNTER — Ambulatory Visit (INDEPENDENT_AMBULATORY_CARE_PROVIDER_SITE_OTHER): Payer: Medicare Other | Admitting: Family Medicine

## 2017-02-24 VITALS — Temp 97.5°F | Resp 16 | Ht 73.0 in | Wt 151.0 lb

## 2017-02-24 DIAGNOSIS — N184 Chronic kidney disease, stage 4 (severe): Secondary | ICD-10-CM

## 2017-02-24 DIAGNOSIS — I10 Essential (primary) hypertension: Secondary | ICD-10-CM

## 2017-02-24 DIAGNOSIS — R2689 Other abnormalities of gait and mobility: Secondary | ICD-10-CM

## 2017-02-24 DIAGNOSIS — R42 Dizziness and giddiness: Secondary | ICD-10-CM | POA: Diagnosis not present

## 2017-02-24 DIAGNOSIS — Z111 Encounter for screening for respiratory tuberculosis: Secondary | ICD-10-CM | POA: Diagnosis not present

## 2017-02-24 DIAGNOSIS — Z8679 Personal history of other diseases of the circulatory system: Secondary | ICD-10-CM | POA: Diagnosis not present

## 2017-02-24 DIAGNOSIS — E1122 Type 2 diabetes mellitus with diabetic chronic kidney disease: Secondary | ICD-10-CM

## 2017-02-24 MED ORDER — ACCU-CHEK NANO SMARTVIEW W/DEVICE KIT: PACK | 0 refills | Status: AC

## 2017-02-24 MED ORDER — GLUCOSE BLOOD VI STRP: ORAL_STRIP | 12 refills | Status: AC

## 2017-02-24 NOTE — Patient Instructions (Signed)
Thank you for coming in to clinic today.  1. Reassurance today, I do not see any significant other causes of his dizziness - Most likely he has some mild low blood pressure worse when standing up suddenly, this is called Orthostatic Hypotension, our readings today was not entirely consistent with this, and I do not think it is severe - Important to stay well hydrated with drinking regular water daily - In future, if BP remains too low, then we can adjust his medications as needed, otherwise I do not want to increase  I do not think he has Vertigo or inner ear problem  Some balance problems may be related to dizziness, or may be related to age and dementia with prior stroke  Ordered new glucometer for checking blood sugar ONLY AS NEEDED, if symptomatic of possible hypoglycemia (tremors, shaking, dizziness), let me know if need to change glucometer order  2nd step TB Skin test today, to be read by RN at [facility]  Already scheduled for follow-up, only return if current symptoms worsening, or if significant dizziness not resolving then may need to go to hospital ED for evaluation  If you have any other questions or concerns, please feel free to call the clinic or send a message through [place]. You may also schedule an earlier appointment if necessary.  Nobie Putnam, DO [place]

## 2017-02-24 NOTE — Progress Notes (Signed)
Subjective:    Patient ID: Kyle Short, male    DOB: July 04, 1946, 71 y.o.   MRN: [id number]  MOSI HANNOLD is a 71 y.o. male presenting on 02/24/2017 for Dizziness (pt feels weak and dizzy they check B/P yesteraday  was 126/76 mm/hg)  History provided by patient and with assistance of Melida Quitter (contact, [phone], owner of care home), at family care home at [facility] in [place].  HPI   DIZZINESS EPISODE: Reports recent history of acute onset symptoms started yesterday with episode of dizziness lasting < 1 hour without significant associated symptoms. He was seated at time of onset symptoms. Not worsened or triggered by standing up, change of position or other factors. No recurrence of dizziness episodes. States that it did not feel like motion sickness and room not spinning. - Additionally admits feels balance has been "off" for past 1-2 weeks, difficult to describe balance problem - Prior history of CVA and dementia - History of orthostatic hypotension in past, do not see any significant treatment specifically for this - Also history of Diabetes Type 2 that had been well controlled, and currently off all DM medications, he is not checking CBG at [facility], requesting orders for glucometer and test strips to check CBG as needed, eating regular diet as well, admits he may not be hydrating with water as he should - Requesting re-check labs now, additionally he is scheduled to return in 2 months for physical with fasting blood work - Denies any fevers/chills, nausea, vomiting, numbness, weakness, headache, vision changes, vertigo room spinning, confusion, fall, near syncope or syncopal episode  TB SKIN TEST - Last visit 01/2017 had PPD skin test, and it was read by [facility] RN staff as negative within 72 hours. Now staff today is requesting repeat PPD for "2nd phase testing" required for [facility] patients to have repeat PPD despite first test negative. Request to  be placed today, and they will call RN staff to read PPD in 48 hours.  Social History  Substance Use Topics  . Smoking status: Current Every Day Smoker    Packs/day: 0.25    Types: Cigarettes  . Smokeless tobacco: Current User  . Alcohol use No    Review of Systems Per HPI unless specifically indicated above     Objective:     Orthostatic Vitals Lying down @ 1354 - BP 170/94, HR 57 Seated @ 1401 - BP 157/91, HR 57 Standing @ 1403 - BP 141/89, HR 64 - Asymptomatic  Temp 97.5 F (36.4 C) (Oral)   Resp 16   Ht '6\' 1"'$  (1.854 m)   Wt 151 lb (68.5 kg)   BMI 19.92 kg/m   Wt Readings from Last 3 Encounters:  02/24/17 151 lb (68.5 kg)  02/10/17 148 lb 3.2 oz (67.2 kg)  10/30/16 144 lb 9.6 oz (65.6 kg)    Physical Exam  Constitutional: He is oriented to person, place, and time. He appears well-developed and well-nourished. No distress.  Elderly appearing 71 year male, currently well-appearing, comfortable, cooperative.  HENT:  Head: Normocephalic and atraumatic.  Mouth/Throat: Oropharynx is clear and moist.  Frontal / maxillary sinuses non-tender. Oropharynx clear without erythema, exudates, edema or asymmetry.  Dix-Hallpike maneuver bilateral negative for any dizziness, vertigo or nystagmus  Eyes: Conjunctivae and EOM are normal. Pupils are equal, round, and reactive to light.  Neck: Normal range of motion. Neck supple.  Cardiovascular: Normal rate, regular rhythm, normal heart sounds and intact distal pulses.  No murmur heard. Pulmonary/Chest: Effort normal and breath sounds normal. No respiratory distress. He has no wheezes. He has no rales.  Musculoskeletal: Normal range of motion. He exhibits no edema.  Upper / Lower Extremities: - Normal muscle tone, strength bilateral upper extremities 5/5, lower extremities 5/5  - Normal Gait  Neurological: He is alert and oriented to person, place, and time. No cranial nerve deficit. Coordination normal.  Distal sensation to light  touch intact  Skin: Skin is warm and dry. No rash noted. He is not diaphoretic.  Psychiatric: He has a normal mood and affect. His behavior is normal.  Unchanged since last visit Limited verbal during history, speech is appropriate but sparse. Participates in exam and follows commands well. Limited insight into condition.  Nursing note and vitals reviewed.  I have personally reviewed the following lab results from 08/04/16.  Results for orders placed or performed in visit on 02/10/17  PPD  Result Value Ref Range   TB Skin Test Negative    Induration 0 mm      Assessment & Plan:   Problem List Items Addressed This Visit    Type 2 diabetes mellitus with chronic kidney disease (HCC)    Stable, well controlled. Unlikely that recent episode due to hypoglycemia without any DM medications, if some poor PO may be possible. - Not currently testing CBGs at [facility] - Asymptomatic currently  Plan: 1. Written for new rx glucometer / accuchek or substitute preferred and test strips, sent to [facility] pharmacy by request, instructions check CBG only PRN symptoms of hypoglycemia (dizziness, tremors, shaking), no routinely check CBG, also can continue regular diet 2. Follow-up as planned with A1c and other labs 04/2017      Relevant Medications   glucose blood (ACCU-CHEK SMARTVIEW) test strip   Blood Glucose Monitoring Suppl (ACCU-CHEK NANO SMARTVIEW) w/Device KIT   History of orthostatic hypotension   Benign essential HTN    Mild elevated BP today, improved on re-check. Concern possible component of orthostatic hypotension given symptoms and prior history, based on readings today does not meet criteria but may be still contributing to symptoms - Followed by Cardiology, Nephrology  1. No med changes today - caution with increasing anti-HTN therapy, avoid hypotension 2. Encouraged improve hydration, caution sudden standing / change of position, fall precaution, some postural dizziness may  occur 3. Check BMET within few days as requested 4. Future orders still in place for upcoming annual physical fasting labs      Relevant Orders   BASIC METABOLIC PANEL WITH GFR    Other Visit Diagnoses    Episode of dizziness    -  Primary  Unclear exact etiology, see above regarding BP. May be postural. Unlikely to be neurological insult with TIA vs CVA given non specific isolated finding of dizziness, no focal neuro deficits. Not consistent with vertigo or BPPV. May still be inner ear related. - However patient remains at risk with prior CVA and dementia - Continue ASA, statin - Follow-up as needed if worsening, reviewed criteria when to go to hospital ED    Relevant Orders   BASIC METABOLIC PANEL WITH GFR   Imbalance     Seems more chronic in nature, if worsening can consider referral for PT possibly neuro vs vestibular if needed for gait/balance training    Visit for TB skin test       Repeat PPD TB skin test, for phase 2, initial test PPD negative 2-3 weeks ago read by [facility]  staff RN, same for this test to be read by outside [facility] RN   Relevant Orders   PPD (Completed)      Meds ordered this encounter  Medications  . glucose blood (ACCU-CHEK SMARTVIEW) test strip    Sig: Check once as needed only if symptoms of hypoglycemia (dizziness, tremors, shaking)    Dispense:  50 each    Refill:  12  . Blood Glucose Monitoring Suppl (ACCU-CHEK NANO SMARTVIEW) w/Device KIT    Sig: Use only for as needed glucose checks if symptomatic.    Dispense:  1 kit    Refill:  0    Please dispense either Accuchek or any similar approved Glucometer for this patient, only PRN glucose checking.    Follow up plan:  Already scheduled in 2 months  Nobie Putnam, [facility] 02/24/2017, 6:38 PM

## 2017-02-24 NOTE — Assessment & Plan Note (Signed)
Stable, well controlled. Unlikely that recent episode due to hypoglycemia without any DM medications, if some poor PO may be possible. - Not currently testing CBGs at [facility] - Asymptomatic currently  Plan: 1. Written for new rx glucometer / accuchek or substitute preferred and test strips, sent to [facility] by request, instructions check CBG only PRN symptoms of hypoglycemia (dizziness, tremors, shaking), no routinely check CBG, also can continue regular diet 2. Follow-up as planned with A1c and other labs 04/2017

## 2017-02-24 NOTE — Assessment & Plan Note (Signed)
Mild elevated BP today, improved on re-check. Concern possible component of orthostatic hypotension given symptoms and prior history, based on readings today does not meet criteria but may be still contributing to symptoms - Followed by Cardiology, Nephrology  1. No med changes today - caution with increasing anti-HTN therapy, avoid hypotension 2. Encouraged improve hydration, caution sudden standing / change of position, fall precaution, some postural dizziness may occur 3. Check BMET within few days as requested 4. Future orders still in place for upcoming annual physical fasting labs

## 2017-03-08 DIAGNOSIS — R42 Dizziness and giddiness: Secondary | ICD-10-CM | POA: Diagnosis not present

## 2017-03-08 DIAGNOSIS — I10 Essential (primary) hypertension: Secondary | ICD-10-CM | POA: Diagnosis not present

## 2017-03-09 LAB — BASIC METABOLIC PANEL WITH GFR
BUN: 29 mg/dL — ABNORMAL HIGH (ref 7–25)
CHLORIDE: 105 mmol/L (ref 98–110)
CO2: 27 mmol/L (ref 20–31)
CREATININE: 2.61 mg/dL — AB (ref 0.70–1.25)
Calcium: 9.5 mg/dL (ref 8.6–10.3)
GFR, Est African American: 28 mL/min — ABNORMAL LOW (ref >=60)
GFR, Est Non African American: 24 mL/min — ABNORMAL LOW (ref >=60)
GLUCOSE: 87 mg/dL (ref 65–99)
Potassium: 4.7 mmol/L (ref 3.5–5.3)
SODIUM: 141 mmol/L (ref 135–146)

## 2017-03-10 ENCOUNTER — Encounter: Payer: Self-pay | Admitting: *Deleted

## 2017-04-21 ENCOUNTER — Other Ambulatory Visit: Payer: Medicare Other

## 2017-04-21 DIAGNOSIS — E1122 Type 2 diabetes mellitus with diabetic chronic kidney disease: Secondary | ICD-10-CM | POA: Diagnosis not present

## 2017-04-21 DIAGNOSIS — F0281 Dementia in other diseases classified elsewhere with behavioral disturbance: Secondary | ICD-10-CM

## 2017-04-21 DIAGNOSIS — I10 Essential (primary) hypertension: Secondary | ICD-10-CM | POA: Diagnosis not present

## 2017-04-21 DIAGNOSIS — N184 Chronic kidney disease, stage 4 (severe): Principal | ICD-10-CM

## 2017-04-21 DIAGNOSIS — F02818 Dementia in other diseases classified elsewhere, unspecified severity, with other behavioral disturbance: Secondary | ICD-10-CM

## 2017-04-21 DIAGNOSIS — G308 Other Alzheimer's disease: Secondary | ICD-10-CM | POA: Diagnosis not present

## 2017-04-21 DIAGNOSIS — E782 Mixed hyperlipidemia: Secondary | ICD-10-CM

## 2017-04-22 ENCOUNTER — Telehealth: Payer: Self-pay

## 2017-04-22 LAB — COMPLETE METABOLIC PANEL WITH GFR
ALT: 12 U/L (ref 9–46)
AST: 17 U/L (ref 10–35)
Albumin: 3.6 g/dL (ref 3.6–5.1)
Alkaline Phosphatase: 65 U/L (ref 40–115)
BUN: 41 mg/dL — AB (ref 7–25)
CHLORIDE: 114 mmol/L — AB (ref 98–110)
CO2: 21 mmol/L (ref 20–31)
Calcium: 8.9 mg/dL (ref 8.6–10.3)
Creat: 3.09 mg/dL — ABNORMAL HIGH (ref 0.70–1.25)
GFR, Est African American: 23 mL/min — ABNORMAL LOW (ref >=60)
GFR, Est Non African American: 20 mL/min — ABNORMAL LOW (ref >=60)
Glucose, Bld: 91 mg/dL (ref 65–99)
POTASSIUM: 5.3 mmol/L (ref 3.5–5.3)
SODIUM: 142 mmol/L (ref 135–146)
Total Bilirubin: 0.3 mg/dL (ref 0.2–1.2)
Total Protein: 7.7 g/dL (ref 6.1–8.1)

## 2017-04-22 LAB — LIPID PANEL
CHOL/HDL RATIO: 1.9 ratio (ref <5.0)
Cholesterol: 166 mg/dL (ref <200)
HDL: 86 mg/dL (ref >40)
LDL Cholesterol: 66 mg/dL (ref <100)
Triglycerides: 68 mg/dL (ref <150)
VLDL: 14 mg/dL (ref <30)

## 2017-04-22 LAB — HEMOGLOBIN A1C
Hgb A1c MFr Bld: 5.8 % — ABNORMAL HIGH (ref <5.7)
Mean Plasma Glucose: 120 mg/dL

## 2017-04-22 NOTE — Telephone Encounter (Signed)
-----   Message from Olin Hauser, DO sent at 04/22/2017  1:58 PM EDT ----- Please contact patient to review the following (No MyChart Access):  1. Chemistry - Normal electrolytes and Liver function. Stable abnormal kidney function with Chronic Kidney Disease.  2. Hemoglobin A1c - 5.8, improved, previously >6  3. Cholesterol - Normal cholesterol results. Controlled on statin medication.  Follow-up as scheduled this month.  Nobie Putnam, DO [facility] 04/22/2017, 1:58 PM

## 2017-04-22 NOTE — Telephone Encounter (Signed)
Melida Quitter advised as blow. Mr clement is on [place]. sd

## 2017-05-05 ENCOUNTER — Ambulatory Visit: Payer: Self-pay | Admitting: Family Medicine

## 2017-05-13 ENCOUNTER — Encounter: Payer: Self-pay | Admitting: Family Medicine

## 2017-05-13 ENCOUNTER — Ambulatory Visit (INDEPENDENT_AMBULATORY_CARE_PROVIDER_SITE_OTHER): Payer: Medicare Other | Admitting: Family Medicine

## 2017-05-13 VITALS — BP 107/73 | HR 68 | Temp 97.8°F | Resp 16 | Ht 73.0 in | Wt 148.0 lb

## 2017-05-13 DIAGNOSIS — F0281 Dementia in other diseases classified elsewhere with behavioral disturbance: Secondary | ICD-10-CM

## 2017-05-13 DIAGNOSIS — G308 Other Alzheimer's disease: Secondary | ICD-10-CM

## 2017-05-13 NOTE — Assessment & Plan Note (Addendum)
Confirmed with updated recent MMSE score today 14/30 Stable without evidence of worsening. Considered FAST Stage 6, dependent on most ADLs, but can feed independently by report. Has some mobility but is limited. - Some concerns with wandering, but not problematic or severe - No concerns disruptive or violent behavior - Not on dementia medications  Plan: 1. Discussion on diagnosis, prognosis and management of Alzheime'rs / Dementia today 2. One concern with caregiver at family care home is his refusal of needed daily medications often - written order that meds may be crushed and given in food / apple sauce - due to potential harm to himself with elevated BP 3. Continue current care at family care home with close supervision, total care, written updated order 4. Handout given with resources - hospice Dementia services recommended 5. Consider future referral to Neurology as indicated if worsening, consider medications, however clinically does not seem like ideal candidate as meds may help memory but limited benefit for other aspects of illness 6. Follow-up 3 months routine

## 2017-05-13 NOTE — Patient Instructions (Addendum)
Thank you for coming to the clinic today.  1. MMSE 14/30, consistent with Alzheimer's Moderate Dementia with behavioral disturbance  Dementia Support Services Lutricia Horsfall (OT, Dementia Specialist) [place],  [address] Ph [phone] [email or web address] - Navigate home and facility services for patients, caregiver support & resources  Please schedule a Follow-up Appointment to: Return in about 3 months (around 08/13/2017) for Dementia, HTN, DM2 A1c.  If you have any other questions or concerns, please feel free to call the clinic or send a message through [place]. You may also schedule an earlier appointment if necessary.  Nobie Putnam, DO [place]

## 2017-05-13 NOTE — Progress Notes (Signed)
Subjective:    Patient ID: Kyle Short, male    DOB: Dec 23, 1945, 71 y.o.   MRN: [id number]  Kyle Short is a 71 y.o. male presenting on 05/13/2017 for Hypertension (as per caregiver pt is not compliance for taking his meds not let them change )  Patient accompanied by Melida Quitter (contact, [phone], owner of care home) [facility] in [place].  HPI   Alzheimer's Dementia with mild behavioral disturbance / H/o Stroke/Cerebrovascular Accident (CVA): - Last visit for this problem 02/10/17, discussed his background of Alzheimer's, see note for details. Significant deficit cognitively seems to have been worsened by prior CVA. - Today here for mental status testing and further discussion on dementia. Caregiver has concerns regarding his frequent refusal of medications such as BP meds and then later BP will be significantly elevated, upon finally taking med dramatic improvement. He is asking if order can be written for medications to be crushed and administered to patient in food when he is refusing to take pill. - Overall no significant changes to dementia or behavior. He still has some occasional wandering within the house, and progressive memory loss. Some agitation. Otherwise no evidence of harmful behavioral disturbances by report. - Receives total care from family care home receives daily assistance with ADLs, needs full assist for dressing, showering, and feeding, toileting. Limited function with mobility, able to do short walks requires walker usually for longer ambulatory times, family visits frequently. Prior CVA likely contributing to dementia, no known residual weakness, but some balance difficulty - Communication is limited but verbally appropriate, he does speak >6 words and more regularly at home - Not followed by Neurology. Never on medications for dementia - Denies any headache, vision changes, weakness, numbness, tingling, worsening agitation or violence, harm to  self or others   Social History  Substance Use Topics  . Smoking status: Current Every Day Smoker    Packs/day: 0.25    Types: Cigarettes  . Smokeless tobacco: Current User  . Alcohol use No    Review of Systems Per HPI unless specifically indicated above     Objective:    BP 107/73   Pulse 68   Temp 97.8 F (36.6 C) (Oral)   Resp 16   Ht 6\' 1"  (1.854 m)   Wt 148 lb (67.1 kg)   BMI 19.53 kg/m   Wt Readings from Last 3 Encounters:  05/13/17 148 lb (67.1 kg)  02/24/17 151 lb (68.5 kg)  02/10/17 148 lb 3.2 oz (67.2 kg)    Physical Exam  Constitutional: He appears well-developed and well-nourished. No distress.  Elderly appearing 71 year male, currently well-appearing, comfortable, cooperative.  HENT:  Head: Normocephalic and atraumatic.  Mouth/Throat: Oropharynx is clear and moist.  Poor dentition without any permanent teeth. No dentures currently  Eyes: Conjunctivae and EOM are normal. Pupils are equal, round, and reactive to light.  Arcus senilis bilateral  Cardiovascular: Normal rate, regular rhythm, normal heart sounds and intact distal pulses.   No murmur heard. Pulmonary/Chest: Effort normal and breath sounds normal. No respiratory distress. He has no wheezes. He has no rales.  Musculoskeletal: Normal range of motion. He exhibits no edema.  Neurological: He is alert. Coordination normal.  Skin: Skin is warm and dry. No rash noted. He is not diaphoretic.  Psychiatric: He has a normal mood and affect. His behavior is normal.  Limited verbal during history, speech is appropriate but sparse. Participates in exam and follows commands well. Limited insight into  condition.  Nursing note and vitals reviewed.  MMSE - Mini Mental State Exam 05/13/2017  Orientation to time 1  Orientation to Place 2  Registration 3  Attention/ Calculation 0  Recall 2  Language- name 2 objects 2  Language- repeat 1  Language- follow 3 step command 3  Language- read & follow direction  0  Write a sentence 0  Copy design 0  Total score 14    Results for orders placed or performed in visit on 04/21/17  Hemoglobin A1c  Result Value Ref Range   Hgb A1c MFr Bld 5.8 (H) <5.7 %   Mean Plasma Glucose 120 mg/dL  Lipid panel  Result Value Ref Range   Cholesterol 166 <200 mg/dL   Triglycerides 68 <150 mg/dL   HDL 86 >40 mg/dL   Total CHOL/HDL Ratio 1.9 <5.0 Ratio   VLDL 14 <30 mg/dL   LDL Cholesterol 66 <100 mg/dL  COMPLETE METABOLIC PANEL WITH GFR  Result Value Ref Range   Sodium 142 135 - 146 mmol/L   Potassium 5.3 3.5 - 5.3 mmol/L   Chloride 114 (H) 98 - 110 mmol/L   CO2 21 20 - 31 mmol/L   Glucose, Bld 91 65 - 99 mg/dL   BUN 41 (H) 7 - 25 mg/dL   Creat 3.09 (H) 0.70 - 1.25 mg/dL   Total Bilirubin 0.3 0.2 - 1.2 mg/dL   Alkaline Phosphatase 65 40 - 115 U/L   AST 17 10 - 35 U/L   ALT 12 9 - 46 U/L   Total Protein 7.7 6.1 - 8.1 g/dL   Albumin 3.6 3.6 - 5.1 g/dL   Calcium 8.9 8.6 - 10.3 mg/dL   GFR, Est African American 23 (L) >=60 mL/min   GFR, Est Non African American 20 (L) >=60 mL/min      Assessment & Plan:   Problem List Items Addressed This Visit    Alzheimer's dementia with behavioral disturbance - Primary    Confirmed with updated recent MMSE score today 14/30 Stable without evidence of worsening. Considered FAST Stage 6, dependent on most ADLs, but can feed independently by report. Has some mobility but is limited. - Some concerns with wandering, but not problematic or severe - No concerns disruptive or violent behavior - Not on dementia medications  Plan: 1. Discussion on diagnosis, prognosis and management of Alzheime'rs / Dementia today 2. One concern with caregiver at family care home is his refusal of needed daily medications often - written order that meds may be crushed and given in food / apple sauce - due to potential harm to himself with elevated BP 3. Continue current care at family care home with close supervision, total care, written  updated order 4. Handout given with resources - hospice Dementia services recommended 5. Consider future referral to Neurology as indicated if worsening, consider medications, however clinically does not seem like ideal candidate as meds may help memory but limited benefit for other aspects of illness 6. Follow-up 3 months routine         No orders of the defined types were placed in this encounter.   Follow up plan: Return in about 3 months (around 08/13/2017) for Dementia, HTN, DM2 A1c.  Nobie Putnam, [facility] 05/13/2017, 6:07 PM

## 2017-06-03 DIAGNOSIS — D631 Anemia in chronic kidney disease: Secondary | ICD-10-CM | POA: Diagnosis not present

## 2017-06-03 DIAGNOSIS — E875 Hyperkalemia: Secondary | ICD-10-CM | POA: Diagnosis not present

## 2017-06-03 DIAGNOSIS — R809 Proteinuria, unspecified: Secondary | ICD-10-CM | POA: Diagnosis not present

## 2017-06-03 DIAGNOSIS — I129 Hypertensive chronic kidney disease with stage 1 through stage 4 chronic kidney disease, or unspecified chronic kidney disease: Secondary | ICD-10-CM | POA: Diagnosis not present

## 2017-06-03 DIAGNOSIS — N184 Chronic kidney disease, stage 4 (severe): Secondary | ICD-10-CM | POA: Diagnosis not present

## 2017-07-15 DIAGNOSIS — D631 Anemia in chronic kidney disease: Secondary | ICD-10-CM | POA: Diagnosis not present

## 2017-07-15 DIAGNOSIS — R809 Proteinuria, unspecified: Secondary | ICD-10-CM | POA: Diagnosis not present

## 2017-07-15 DIAGNOSIS — N2581 Secondary hyperparathyroidism of renal origin: Secondary | ICD-10-CM | POA: Diagnosis not present

## 2017-07-15 DIAGNOSIS — N184 Chronic kidney disease, stage 4 (severe): Secondary | ICD-10-CM | POA: Diagnosis not present

## 2017-07-15 DIAGNOSIS — E785 Hyperlipidemia, unspecified: Secondary | ICD-10-CM | POA: Diagnosis not present

## 2017-07-15 DIAGNOSIS — E1122 Type 2 diabetes mellitus with diabetic chronic kidney disease: Secondary | ICD-10-CM | POA: Diagnosis not present

## 2017-07-15 DIAGNOSIS — I10 Essential (primary) hypertension: Secondary | ICD-10-CM | POA: Diagnosis not present

## 2017-07-30 DIAGNOSIS — N2581 Secondary hyperparathyroidism of renal origin: Secondary | ICD-10-CM | POA: Diagnosis not present

## 2017-07-30 DIAGNOSIS — E785 Hyperlipidemia, unspecified: Secondary | ICD-10-CM | POA: Diagnosis not present

## 2017-07-30 DIAGNOSIS — I10 Essential (primary) hypertension: Secondary | ICD-10-CM | POA: Diagnosis not present

## 2017-07-30 DIAGNOSIS — N184 Chronic kidney disease, stage 4 (severe): Secondary | ICD-10-CM | POA: Diagnosis not present

## 2017-07-30 DIAGNOSIS — E1122 Type 2 diabetes mellitus with diabetic chronic kidney disease: Secondary | ICD-10-CM | POA: Diagnosis not present

## 2017-07-30 DIAGNOSIS — D631 Anemia in chronic kidney disease: Secondary | ICD-10-CM | POA: Diagnosis not present

## 2017-07-30 DIAGNOSIS — R809 Proteinuria, unspecified: Secondary | ICD-10-CM | POA: Diagnosis not present

## 2017-08-06 ENCOUNTER — Encounter: Payer: Self-pay | Admitting: Emergency Medicine

## 2017-08-06 ENCOUNTER — Observation Stay
Admission: EM | Admit: 2017-08-06 | Discharge: 2017-08-07 | Payer: Medicare Other | Attending: Specialist | Admitting: Specialist

## 2017-08-06 ENCOUNTER — Emergency Department: Payer: Medicare Other

## 2017-08-06 DIAGNOSIS — E860 Dehydration: Secondary | ICD-10-CM | POA: Diagnosis not present

## 2017-08-06 DIAGNOSIS — Z8673 Personal history of transient ischemic attack (TIA), and cerebral infarction without residual deficits: Secondary | ICD-10-CM | POA: Diagnosis not present

## 2017-08-06 DIAGNOSIS — E1122 Type 2 diabetes mellitus with diabetic chronic kidney disease: Secondary | ICD-10-CM | POA: Diagnosis not present

## 2017-08-06 DIAGNOSIS — R748 Abnormal levels of other serum enzymes: Secondary | ICD-10-CM | POA: Diagnosis not present

## 2017-08-06 DIAGNOSIS — Z833 Family history of diabetes mellitus: Secondary | ICD-10-CM | POA: Diagnosis not present

## 2017-08-06 DIAGNOSIS — Z79899 Other long term (current) drug therapy: Secondary | ICD-10-CM | POA: Diagnosis not present

## 2017-08-06 DIAGNOSIS — R2681 Unsteadiness on feet: Secondary | ICD-10-CM | POA: Diagnosis not present

## 2017-08-06 DIAGNOSIS — N184 Chronic kidney disease, stage 4 (severe): Secondary | ICD-10-CM | POA: Insufficient documentation

## 2017-08-06 DIAGNOSIS — I951 Orthostatic hypotension: Secondary | ICD-10-CM | POA: Diagnosis not present

## 2017-08-06 DIAGNOSIS — R404 Transient alteration of awareness: Secondary | ICD-10-CM | POA: Diagnosis not present

## 2017-08-06 DIAGNOSIS — G309 Alzheimer's disease, unspecified: Secondary | ICD-10-CM | POA: Diagnosis not present

## 2017-08-06 DIAGNOSIS — R531 Weakness: Secondary | ICD-10-CM | POA: Diagnosis not present

## 2017-08-06 DIAGNOSIS — I428 Other cardiomyopathies: Secondary | ICD-10-CM | POA: Diagnosis not present

## 2017-08-06 DIAGNOSIS — F0281 Dementia in other diseases classified elsewhere with behavioral disturbance: Secondary | ICD-10-CM | POA: Diagnosis not present

## 2017-08-06 DIAGNOSIS — Z7982 Long term (current) use of aspirin: Secondary | ICD-10-CM | POA: Diagnosis not present

## 2017-08-06 DIAGNOSIS — R42 Dizziness and giddiness: Secondary | ICD-10-CM | POA: Diagnosis not present

## 2017-08-06 DIAGNOSIS — I129 Hypertensive chronic kidney disease with stage 1 through stage 4 chronic kidney disease, or unspecified chronic kidney disease: Secondary | ICD-10-CM | POA: Diagnosis not present

## 2017-08-06 DIAGNOSIS — F0390 Unspecified dementia without behavioral disturbance: Secondary | ICD-10-CM | POA: Diagnosis not present

## 2017-08-06 DIAGNOSIS — R778 Other specified abnormalities of plasma proteins: Secondary | ICD-10-CM | POA: Diagnosis present

## 2017-08-06 DIAGNOSIS — R55 Syncope and collapse: Secondary | ICD-10-CM

## 2017-08-06 DIAGNOSIS — I252 Old myocardial infarction: Secondary | ICD-10-CM | POA: Diagnosis not present

## 2017-08-06 DIAGNOSIS — Z8249 Family history of ischemic heart disease and other diseases of the circulatory system: Secondary | ICD-10-CM | POA: Insufficient documentation

## 2017-08-06 DIAGNOSIS — N390 Urinary tract infection, site not specified: Secondary | ICD-10-CM | POA: Insufficient documentation

## 2017-08-06 DIAGNOSIS — R7989 Other specified abnormal findings of blood chemistry: Secondary | ICD-10-CM

## 2017-08-06 DIAGNOSIS — E785 Hyperlipidemia, unspecified: Secondary | ICD-10-CM | POA: Insufficient documentation

## 2017-08-06 DIAGNOSIS — F17210 Nicotine dependence, cigarettes, uncomplicated: Secondary | ICD-10-CM | POA: Diagnosis not present

## 2017-08-06 LAB — URINALYSIS, COMPLETE (UACMP) WITH MICROSCOPIC
Bilirubin Urine: NEGATIVE
GLUCOSE, UA: NEGATIVE mg/dL
Hgb urine dipstick: NEGATIVE
KETONES UR: NEGATIVE mg/dL
NITRITE: POSITIVE — AB
PH: 5 (ref 5.0–8.0)
Protein, ur: NEGATIVE mg/dL
Specific Gravity, Urine: 1.013 (ref 1.005–1.030)

## 2017-08-06 LAB — CBC
HCT: 33.5 % — ABNORMAL LOW (ref 40.0–52.0)
Hemoglobin: 11 g/dL — ABNORMAL LOW (ref 13.0–18.0)
MCH: 28.2 pg (ref 26.0–34.0)
MCHC: 32.9 g/dL (ref 32.0–36.0)
MCV: 85.8 fL (ref 80.0–100.0)
PLATELETS: 228 10*3/uL (ref 150–440)
RBC: 3.9 MIL/uL — ABNORMAL LOW (ref 4.40–5.90)
RDW: 14.2 % (ref 11.5–14.5)
WBC: 5.1 10*3/uL (ref 3.8–10.6)

## 2017-08-06 LAB — TROPONIN I
TROPONIN I: 0.07 ng/mL — AB (ref <0.03)
TROPONIN I: 0.07 ng/mL — AB (ref <0.03)
Troponin I: 0.07 ng/mL (ref <0.03)

## 2017-08-06 LAB — HEMOGLOBIN A1C
HEMOGLOBIN A1C: 6.5 % — AB (ref 4.8–5.6)
MEAN PLASMA GLUCOSE: 139.85 mg/dL

## 2017-08-06 LAB — BASIC METABOLIC PANEL
ANION GAP: 8 (ref 5–15)
BUN: 45 mg/dL — AB (ref 6–20)
CALCIUM: 9.4 mg/dL (ref 8.9–10.3)
CO2: 22 mmol/L (ref 22–32)
CREATININE: 3.16 mg/dL — AB (ref 0.61–1.24)
Chloride: 108 mmol/L (ref 101–111)
GFR calc Af Amer: 22 mL/min — ABNORMAL LOW (ref >60)
GFR, EST NON AFRICAN AMERICAN: 19 mL/min — AB (ref >60)
GLUCOSE: 105 mg/dL — AB (ref 65–99)
Potassium: 4.4 mmol/L (ref 3.5–5.1)
Sodium: 138 mmol/L (ref 135–145)

## 2017-08-06 LAB — TSH: TSH: 3.814 u[IU]/mL (ref 0.350–4.500)

## 2017-08-06 LAB — GLUCOSE, CAPILLARY: GLUCOSE-CAPILLARY: 93 mg/dL (ref 65–99)

## 2017-08-06 MED ORDER — ACETAMINOPHEN 650 MG RE SUPP: 650.0000 mg | Freq: Four times a day (QID) | RECTAL | Status: DC | PRN

## 2017-08-06 MED ORDER — ONDANSETRON HCL 4 MG/2ML IJ SOLN: 4.0000 mg | Freq: Four times a day (QID) | INTRAMUSCULAR | Status: DC | PRN

## 2017-08-06 MED ORDER — ISOSORBIDE MONONITRATE ER 30 MG PO TB24
30.0000 mg | ORAL_TABLET | Freq: Every day | ORAL | Status: DC
  Administered 2017-08-07: 30 mg via ORAL
  Filled 2017-08-06: qty 1

## 2017-08-06 MED ORDER — CARVEDILOL 12.5 MG PO TABS
12.5000 mg | ORAL_TABLET | Freq: Two times a day (BID) | ORAL | Status: DC
  Administered 2017-08-06 – 2017-08-07 (×2): 12.5 mg via ORAL
  Filled 2017-08-06 (×2): qty 1

## 2017-08-06 MED ORDER — ENOXAPARIN SODIUM 30 MG/0.3ML ~~LOC~~ SOLN
30.0000 mg | SUBCUTANEOUS | Status: DC
  Administered 2017-08-06: 30 mg via SUBCUTANEOUS
  Filled 2017-08-06: qty 0.3

## 2017-08-06 MED ORDER — MELATONIN 5 MG PO TABS
10.0000 mg | ORAL_TABLET | Freq: Every evening | ORAL | Status: DC | PRN
  Filled 2017-08-06: qty 2

## 2017-08-06 MED ORDER — SODIUM CHLORIDE 0.9 % IV BOLUS (SEPSIS)
500.0000 mL | Freq: Once | INTRAVENOUS | Status: AC
  Administered 2017-08-06: 500 mL via INTRAVENOUS

## 2017-08-06 MED ORDER — HYDRALAZINE HCL 25 MG PO TABS
25.0000 mg | ORAL_TABLET | Freq: Four times a day (QID) | ORAL | Status: DC
  Administered 2017-08-06 – 2017-08-07 (×3): 25 mg via ORAL
  Filled 2017-08-06 (×3): qty 1

## 2017-08-06 MED ORDER — VITAMIN D 1000 UNITS PO TABS
2000.0000 [IU] | ORAL_TABLET | Freq: Every day | ORAL | Status: DC
  Administered 2017-08-07: 1000 [IU] via ORAL
  Filled 2017-08-06: qty 2

## 2017-08-06 MED ORDER — ONDANSETRON HCL 4 MG PO TABS: 4.0000 mg | ORAL_TABLET | Freq: Four times a day (QID) | ORAL | Status: DC | PRN

## 2017-08-06 MED ORDER — ACETAMINOPHEN 325 MG PO TABS: 650.0000 mg | ORAL_TABLET | Freq: Four times a day (QID) | ORAL | Status: DC | PRN

## 2017-08-06 MED ORDER — ATORVASTATIN CALCIUM 10 MG PO TABS
10.0000 mg | ORAL_TABLET | Freq: Every day | ORAL | Status: DC
  Administered 2017-08-07: 10 mg via ORAL
  Filled 2017-08-06: qty 1

## 2017-08-06 MED ORDER — ASPIRIN 81 MG PO CHEW
81.0000 mg | CHEWABLE_TABLET | Freq: Every day | ORAL | Status: DC
  Administered 2017-08-07: 81 mg via ORAL
  Filled 2017-08-06: qty 1

## 2017-08-06 NOTE — ED Notes (Signed)
Notified pt's legal guardian patient was at the [facility].

## 2017-08-06 NOTE — ED Notes (Signed)
Attempted to get urine sample, pt unable to produce a sample. Pt able to stand at side of the bed with urine with stand-by assistance.

## 2017-08-06 NOTE — ED Notes (Signed)
Pt sitting on toilet attempting to have a BM.  Pt assisted x1 by Jesus Genera.

## 2017-08-06 NOTE — ED Triage Notes (Signed)
Pt arrived via EMS from home. Pt stays in a care home in [place].  Contact person is Madelynn Done [phone].  Per EMS, pt was sitting on the porch and staff report pt became pale and diaphoretic and weak.  It took 2 people to get the  Patient inside the house.  Pt denies any pain or dizziness. Pt has hx of etoh and substance abuse in the past.  Pt has hx of dementia and staff reported to EMS pt is at his baseline mental status.  Pt denies any pain or dizziness at this time. Pt is alert.

## 2017-08-06 NOTE — ED Notes (Addendum)
Notified Sonya pt's legal guardian that the patient is being admitted, also notified Madelynn Done pt was being admitted

## 2017-08-06 NOTE — ED Provider Notes (Signed)
[facility] Emergency Department Provider Note  ____________________________________________   I have reviewed the triage vital signs and the nursing notes.   HISTORY  Chief Complaint Near Syncope    HPI Kyle Short is a 71 y.o. male with Alzheimer's disease card he myopathy, chronic kidney disease, presents today complaining ofhaving felt weak. Patient states that he was sitting on the porch and 90s and this is essentially and became very lightheaded. According to EMS, he became diaphoretic, was too weak to get around, this is a new finding for him. He had no focal numbness or weakness. He denies headache or stiff neck. He states he just felt somewhat weak. He started to feel better at this time. He denies any fever or chills. History is somewhat limited by his history of dementia.when fire rescue arrived on the scene, patient was found to have a thready pulse which rapidly improved    Past Medical History:  Diagnosis Date  . Alcohol abuse   . Alzheimer's dementia with behavioral disturbance   . Anemia of chronic disease   . Cardiomyopathy due to hypertension, without heart failure ([place])   . Chronic kidney disease (CKD) stage G3b/A1, moderately decreased glomerular filtration rate (GFR) between 30-44 mL/min/1.73 square meter and albuminuria creatinine ratio less than 30 mg/g   . Hyperlipidemia   . Hypertension   . Lives in group home   . Myocardial infarction ([place])   . Stroke [facility])     Patient Active Problem List   Diagnosis Date Noted  . Stroke ([place]) 02/10/2017  . Lives in group home 02/10/2017  . Secondary cardiomyopathy ([place]) 10/14/2015  . Myocardial infarction ([place]) 10/14/2015  . History of orthostatic hypotension 09/11/2015  . Syncope 09/11/2015  . Chronic kidney disease, stage 4, severely decreased GFR (HCC) 07/26/2015  . Benign essential HTN 07/26/2015  . Type 2 diabetes mellitus with chronic kidney disease ([place]) 07/25/2015  . H/O  alcohol abuse 07/25/2015  . HLD (hyperlipidemia) 07/25/2015  . Cerebrovascular accident, old 07/25/2015  . Alzheimer's dementia with behavioral disturbance 06/07/2015  . Cardiomyopathy ([place]) 06/07/2015  . Current tobacco use 02/26/2015    Past Surgical History:  Procedure Laterality Date  . TONSILLECTOMY      Prior to Admission medications   Medication Sig Start Date End Date Taking? Authorizing Provider  aspirin (ASPIRIN LOW DOSE) 81 MG chewable tablet Chew 1 tablet (81 mg total) by mouth daily. 02/10/17  Yes Karamalegos, Devonne Doughty, DO  atorvastatin (LIPITOR) 10 MG tablet Take 1 tablet (10 mg total) by mouth daily. 02/10/17  Yes Karamalegos, Devonne Doughty, DO  bismuth subsalicylate (PEPTO BISMOL) 262 MG chewable tablet Chew 262 mg by mouth daily as needed for indigestion or diarrhea or loose stools.   Yes [provider]  Blood Glucose Monitoring Suppl (ACCU-CHEK NANO SMARTVIEW) w/Device KIT Use only for as needed glucose checks if symptomatic. 02/24/17  Yes Karamalegos, Devonne Doughty, DO  carvedilol (COREG) 12.5 MG tablet Take 1 tablet (12.5 mg total) by mouth 2 (two) times daily with a meal. 02/10/17  Yes Karamalegos, Devonne Doughty, DO  Cholecalciferol (HM VITAMIN D3) 2000 units CAPS Take 1 capsule (2,000 Units total) by mouth daily. 02/10/17  Yes Karamalegos, Devonne Doughty, DO  glucose blood (ACCU-CHEK SMARTVIEW) test strip Check once as needed only if symptoms of hypoglycemia (dizziness, tremors, shaking) 02/24/17  Yes Karamalegos, Devonne Doughty, DO  hydrALAZINE (APRESOLINE) 25 MG tablet Take 1 tablet (25 mg total) by mouth 4 (four) times daily. 02/10/17  Yes Karamalegos,  Devonne Doughty, DO  isosorbide mononitrate (IMDUR) 30 MG 24 hr tablet Take 1 tablet (30 mg total) by mouth daily. 02/10/17  Yes Karamalegos, Devonne Doughty, DO  Melatonin 10 MG CAPS Take 10 mg by mouth at bedtime as needed. 02/10/17  Yes Karamalegos, Devonne Doughty, DO  lisinopril (PRINIVIL,ZESTRIL) 10 MG tablet Take 1 tablet (10 mg  total) by mouth daily. Patient not taking: Reported on 08/06/2017 02/10/17   Olin Hauser, DO    Allergies Patient has no known allergies.  Family History  Problem Relation Age of Onset  . Hypertension Other   . Hypertension Mother   . Diabetes Father     Social History Social History  Substance Use Topics  . Smoking status: Current Every Day Smoker    Packs/day: 0.25    Types: Cigarettes  . Smokeless tobacco: Current User  . Alcohol use No    Review of Systems Constitutional: No fever/chills Eyes: No visual changes. ENT: No sore throat. No stiff neck no neck pain Cardiovascular: Denies chest pain. Respiratory: Denies shortness of breath. Gastrointestinal:   no vomiting.  No diarrhea.  No constipation. Genitourinary: Negative for dysuria. Musculoskeletal: Negative lower extremity swelling Skin: Negative for rash. Neurological: Negative for severe headaches, focal weakness or numbness.   ____________________________________________   PHYSICAL EXAM:  VITAL SIGNS: ED Triage Vitals  Enc Vitals Group     BP 08/06/17 1229 (!) 151/86     Pulse Rate 08/06/17 1229 62     Resp 08/06/17 1229 18     Temp 08/06/17 1229 97.7 F (36.5 C)     Temp Source 08/06/17 1229 Oral     SpO2 08/06/17 1220 96 %     Weight 08/06/17 1228 150 lb (68 kg)     Height 08/06/17 1228 '6\' 1"'$  (1.854 m)     Head Circumference --      Peak Flow --      Pain Score 08/06/17 1221 0     Pain Loc --      Pain Edu? --      Excl. in [place]? --     Constitutional: Alert and oriented. Well appearing and in no acute distress. Eyes: Conjunctivae are normal Head: Atraumatic HEENT: No congestion/rhinnorhea. Mucous membranes are moist.  Oropharynx non-erythematous Neck:   Nontender with no meningismus, no masses, no stridor Cardiovascular: Normal rate, regular rhythm. Grossly normal heart sounds.  Good peripheral circulation. Respiratory: Normal respiratory effort.  No retractions. Lungs  CTAB. Abdominal: Soft and nontender. No distention. No guarding no rebound Back:  There is no focal tenderness or step off.  there is no midline tenderness there are no lesions noted. there is no CVA tenderness Musculoskeletal: No lower extremity tenderness, no upper extremity tenderness. No joint effusions, no DVT signs strong distal pulses no edema Neurologic:  Normal speech and language. No gross focal neurologic deficits are appreciated.  Skin:  Skin is warm, dry and intact. No rash noted. Psychiatric: Mood and affect are normal. Speech and behavior are normal.  ____________________________________________   LABS (all labs ordered are listed, but only abnormal results are displayed)  Labs Reviewed  BASIC METABOLIC PANEL - Abnormal; Notable for the following:       Result Value   Glucose, Bld 105 (*)    BUN 45 (*)    Creatinine, Ser 3.16 (*)    GFR calc non Af Amer 19 (*)    GFR calc Af Amer 22 (*)    All other components within normal limits  CBC - Abnormal; Notable for the following:    RBC 3.90 (*)    Hemoglobin 11.0 (*)    HCT 33.5 (*)    All other components within normal limits  TROPONIN I - Abnormal; Notable for the following:    Troponin I 0.07 (*)    All other components within normal limits  GLUCOSE, CAPILLARY  URINALYSIS, COMPLETE (UACMP) WITH MICROSCOPIC  CBG MONITORING, ED   ____________________________________________  EKG  I personally interpreted any EKGs ordered by me or triage Sinus rhythm first degree AV block rate 64 bpm, LAD noted, no acute ischemic changes nonspecific ST changes ____________________________________________  RADIOLOGY  I reviewed any imaging ordered by me or triage that were performed during my shift and, if possible, patient and/or family made aware of any abnormal findings. ____________________________________________   PROCEDURES  Procedure(s) performed: None  Procedures  Critical Care performed:  None  ____________________________________________   INITIAL IMPRESSION / ASSESSMENT AND PLAN / ED COURSE  Pertinent labs & imaging results that were available during my care of the patient were reviewed by me and considered in my medical decision making (see chart for details).  Admission of the near syncopal event with initially low blood pressure, history is very limited by patient's dementia however he denies any distress. Patient workup is remarkable for troponin of 0.07 however, patient is of some degree of renal insufficiency. Given hishistory of syncope, witnessed, with diaphoresis, or a troponin, and low blood pressure think perhaps observing him mightbe in his best interest.    ____________________________________________   FINAL CLINICAL IMPRESSION(S) / ED DIAGNOSES  Final diagnoses:  None      This chart was dictated using voice recognition software.  Despite best efforts to proofread,  errors can occur which can change meaning.      Schuyler Amor, MD 08/06/17 1420

## 2017-08-06 NOTE — H&P (Signed)
[facility] [place] NAME: Kyle Short    MR#:  [id number]  DATE OF BIRTH:  August 17, 1946  DATE OF ADMISSION:  08/06/2017  PRIMARY CARE PHYSICIAN: Olin Hauser, DO   REQUESTING/REFERRING PHYSICIAN:  Dr. Charlotte Crumb  CHIEF COMPLAINT:   Chief Complaint  Short presents with  . Near Syncope    HISTORY OF PRESENT ILLNESS:  Kyle Short  is a 71 y.o. 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  Short has Alzheimer's dementia and unable to provide history. He says he usually has a cane to ambulate. But he woke up feeling extremely weak. He was noted to be sitting and diaphoretic. Noted to have cardiomyopathy, last ejection fraction of 55% from 2016.07. Also Short has chronic kidney disease. Denies any complaints at this time other than weakness. Urine analysis is pending. No fevers or chills, no nausea or vomiting. No abdominal pain or chest pain at this time  PAST MEDICAL HISTORY:   Past Medical History:  Diagnosis Date  . Alcohol abuse   . Alzheimer's dementia with behavioral disturbance   . Anemia of chronic disease   . Cardiomyopathy due to hypertension, without heart failure ([place])   . Chronic kidney disease (CKD) stage G3b/A1, moderately decreased glomerular filtration rate (GFR) between 30-44 mL/min/1.73 square meter and albuminuria creatinine ratio less than 30 mg/g   . Hyperlipidemia   . Hypertension   . Lives in group home   . Myocardial infarction ([place])   . Stroke [facility])     PAST SURGICAL HISTORY:   Past Surgical History:  Procedure Laterality Date  . TONSILLECTOMY      SOCIAL HISTORY:   Social History  Substance Use Topics  . Smoking status: Current Every Day Smoker    Packs/day: 0.25    Types:  Cigarettes  . Smokeless tobacco: Current User  . Alcohol use No    FAMILY HISTORY:   Family History  Problem Relation Age of Onset  . Hypertension Other   . Hypertension Mother   . Diabetes Father     DRUG ALLERGIES:  No Known Allergies  REVIEW OF SYSTEMS:   Review of Systems  Constitutional: Positive for chills and diaphoresis. Negative for fever, malaise/fatigue and weight loss.  HENT: Negative for ear discharge, ear pain, hearing loss and nosebleeds.   Eyes: Negative for blurred vision, double vision and photophobia.  Respiratory: Negative for cough, hemoptysis, shortness of breath and wheezing.   Cardiovascular: Negative for chest pain, palpitations, orthopnea and leg swelling.  Gastrointestinal: Negative for abdominal pain, constipation, diarrhea, heartburn, melena, nausea and vomiting.  Genitourinary: Negative for dysuria, frequency, hematuria and urgency.  Musculoskeletal: Negative for back pain, myalgias and neck pain.  Skin: Negative for rash.  Neurological: Positive for weakness. Negative for dizziness, tingling, tremors, sensory change, speech change, focal weakness and headaches.  Endo/Heme/Allergies: Does not bruise/bleed easily.  Psychiatric/Behavioral: Negative for depression.    MEDICATIONS AT HOME:   Prior to Admission medications   Medication Sig Start Date End Date Taking? Authorizing Provider  aspirin (ASPIRIN LOW DOSE) 81 MG chewable tablet Chew 1 tablet (81 mg total) by mouth daily. 02/10/17  Yes Karamalegos, Devonne Doughty, DO  atorvastatin (LIPITOR) 10 MG tablet Take 1 tablet (10 mg total) by mouth daily. 02/10/17  Yes Olin Hauser, DO  bismuth subsalicylate (PEPTO BISMOL) 262 MG chewable tablet Chew 262 mg by mouth daily as needed for indigestion or diarrhea or loose stools.   Yes [provider]  Blood Glucose Monitoring Suppl (ACCU-CHEK NANO SMARTVIEW) w/Device KIT Use only for as needed glucose checks if symptomatic. 02/24/17  Yes  Karamalegos, Devonne Doughty, DO  carvedilol (COREG) 12.5 MG tablet Take 1 tablet (12.5 mg total) by mouth 2 (two) times daily with a meal. 02/10/17  Yes Karamalegos, Devonne Doughty, DO  Cholecalciferol (HM VITAMIN D3) 2000 units CAPS Take 1 capsule (2,000 Units total) by mouth daily. 02/10/17  Yes Karamalegos, Devonne Doughty, DO  glucose blood (ACCU-CHEK SMARTVIEW) test strip Check once as needed only if symptoms of hypoglycemia (dizziness, tremors, shaking) 02/24/17  Yes Karamalegos, Devonne Doughty, DO  hydrALAZINE (APRESOLINE) 25 MG tablet Take 1 tablet (25 mg total) by mouth 4 (four) times daily. 02/10/17  Yes Karamalegos, Devonne Doughty, DO  isosorbide mononitrate (IMDUR) 30 MG 24 hr tablet Take 1 tablet (30 mg total) by mouth daily. 02/10/17  Yes Karamalegos, Devonne Doughty, DO  Melatonin 10 MG CAPS Take 10 mg by mouth at bedtime as needed. 02/10/17  Yes Karamalegos, Devonne Doughty, DO  lisinopril (PRINIVIL,ZESTRIL) 10 MG tablet Take 1 tablet (10 mg total) by mouth daily. Short not taking: Reported on 08/06/2017 02/10/17   Olin Hauser, DO      VITAL SIGNS:  Blood pressure 133/84, pulse 70, temperature 97.7 F (36.5 C), temperature source Oral, resp. rate 14, height _0  (1.854 m), weight 68 kg (150 lb), SpO2 98 %.  PHYSICAL EXAMINATION:   Physical Exam  GENERAL:  71 y.o.-year-old Short lying in Kyle bed with no acute distress.  EYES: Pupils equal, round, reactive to light and accommodation. No scleral icterus. Extraocular muscles intact.  HEENT: Head atraumatic, normocephalic. Oropharynx and nasopharynx clear.  NECK:  Supple, no jugular venous distention. No thyroid enlargement, no tenderness.  LUNGS: Normal breath sounds bilaterally, no wheezing, rales,rhonchi or crepitation. No use of accessory muscles of respiration.  CARDIOVASCULAR: S1, S2 normal. No murmurs, rubs, or gallops.  ABDOMEN: Soft, nontender, nondistended. Bowel sounds present. No organomegaly or mass.  EXTREMITIES: No pedal edema,  cyanosis, or clubbing.  NEUROLOGIC: Cranial nerves II through XII are intact. Muscle strength 5/5 in all extremities. Sensation intact. Gait not checked.  PSYCHIATRIC: Kyle Short is alert and oriented x 1-2.  SKIN: No obvious rash, lesion, or ulcer.   LABORATORY PANEL:   CBC  Recent Labs Lab 08/06/17 1231  WBC 5.1  HGB 11.0*  HCT 33.5*  PLT 228   ------------------------------------------------------------------------------------------------------------------  Chemistries   Recent Labs Lab 08/06/17 1231  NA 138  K 4.4  CL 108  CO2 22  GLUCOSE 105*  BUN 45*  CREATININE 3.16*  CALCIUM 9.4   ------------------------------------------------------------------------------------------------------------------  Cardiac Enzymes  Recent Labs Lab 08/06/17 1231  TROPONINI 0.07*   ------------------------------------------------------------------------------------------------------------------  RADIOLOGY:  Dg Chest 2 View  Result Date: 08/06/2017 CLINICAL DATA:  Diaphoretic, weakness. History of ETOH abuse and substance abuse in Kyle past. History of dementia. Current smoker. EXAM: CHEST  2 VIEW COMPARISON:  Chest x-ray dated 01/20/2016. FINDINGS: Heart size and mediastinal contours are stable. Coarse lung markings bilaterally suggests some degree of chronic interstitial lung disease. Lungs otherwise clear. No pleural effusion or pneumothorax seen. No acute or suspicious osseous finding. IMPRESSION: 1. No active cardiopulmonary disease. No evidence of pneumonia or pulmonary edema. 2. 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 Electronically Signed   By: Roxy Horseman.D.  On: 08/06/2017 13:12    EKG:   Orders placed or performed during Kyle hospital encounter of 08/06/17  . EKG 12-Lead  . EKG 12-Lead  . ED EKG  . ED EKG    IMPRESSION AND PLAN:   Kyle Short  is a 71 y.o. male with a known history of Alzheimer's dementia, nonischemic cardiomyopathy with last  ejection fraction of 55%, alcohol abuse, hypertension, ongoing smoking, CKD stage IV was brought in from group home secondary to weakness and diaphoresis.   #1 generalized weakness-check urinalysis. Could be dehydration. -Mild elevation in troponin needs to be followed on -Physical therapy consult.  #2 elevated troponin-likely demand ischemia and also renal insufficiency. -Baseline creatinine seems to be around 3.1, slightly elevated BUN. Gentle hydration and monitor. -Monitor on telemetry, recycle troponins - check echocardiogram.  #3 hypertension-continue hydralazine, Imdur and Coreg  #4 Alzheimer's dementia-seems to be at baseline. Oriented 2. Pleasantly confused.  #5 DVT prophylaxis-Lovenox   Physical therapy consulted    All Kyle records are reviewed and case discussed with ED provider. Management plans discussed with Kyle Short, family and they are in agreement.  CODE STATUS: Full Code  TOTAL TIME TAKING CARE OF THIS Short: 50 minutes.    Lance Huaracha M.D on 08/06/2017 at 3:00 PM  Between 7am to 6pm - Pager - [phone]  After 6pm go to [email or web address] - password [email or web address] [place] [facility]  Office  [phone]  CC: Primary care physician; Olin Hauser, DO

## 2017-08-06 NOTE — Progress Notes (Signed)
Anticoagulation monitoring(Lovenox):  71 yo male ordered Lovenox 40 mg Q24h  Filed Weights   08/06/17 1228  Weight: 150 lb (68 kg)   BMI    Lab Results  Component Value Date   CREATININE 3.16 (H) 08/06/2017   CREATININE 3.09 (H) 04/21/2017   CREATININE 2.61 (H) 03/08/2017   Estimated Creatinine Clearance: 21.2 mL/min (A) (by C-G formula based on SCr of 3.16 mg/dL (H)). Hemoglobin & Hematocrit     Component Value Date/Time   HGB 11.0 (L) 08/06/2017 1231   HGB 10.7 (L) 01/17/2015 1122   HCT 33.5 (L) 08/06/2017 1231   HCT 33.8 (L) 01/17/2015 1122     Per Protocol for Patient with estCrcl < 30 ml/min and BMI < 40, will transition to Lovenox 30 mg Q24h.

## 2017-08-06 NOTE — ED Notes (Signed)
Date and time results received: 08/06/17 2:25 PM (use smartphrase ".now" to insert current time)  Test: Troponin Critical Value: 0.07  Name of Provider Notified: Dr. Burlene Arnt  Orders Received? Or Actions Taken?: No new orders at this time.

## 2017-08-07 ENCOUNTER — Observation Stay (HOSPITAL_BASED_OUTPATIENT_CLINIC_OR_DEPARTMENT_OTHER)
Admit: 2017-08-07 | Discharge: 2017-08-07 | Disposition: A | Discharge status: Home / Self Care | Payer: Medicare Other | Attending: Internal Medicine | Admitting: Internal Medicine

## 2017-08-07 DIAGNOSIS — E860 Dehydration: Secondary | ICD-10-CM | POA: Diagnosis not present

## 2017-08-07 DIAGNOSIS — R0600 Dyspnea, unspecified: Secondary | ICD-10-CM | POA: Diagnosis not present

## 2017-08-07 DIAGNOSIS — R531 Weakness: Secondary | ICD-10-CM | POA: Diagnosis not present

## 2017-08-07 DIAGNOSIS — R748 Abnormal levels of other serum enzymes: Secondary | ICD-10-CM | POA: Diagnosis not present

## 2017-08-07 DIAGNOSIS — F0390 Unspecified dementia without behavioral disturbance: Secondary | ICD-10-CM | POA: Diagnosis not present

## 2017-08-07 DIAGNOSIS — N390 Urinary tract infection, site not specified: Secondary | ICD-10-CM | POA: Diagnosis not present

## 2017-08-07 LAB — BASIC METABOLIC PANEL
ANION GAP: 7 (ref 5–15)
BUN: 42 mg/dL — ABNORMAL HIGH (ref 6–20)
CALCIUM: 8.7 mg/dL — AB (ref 8.9–10.3)
CO2: 21 mmol/L — AB (ref 22–32)
CREATININE: 2.7 mg/dL — AB (ref 0.61–1.24)
Chloride: 110 mmol/L (ref 101–111)
GFR, EST AFRICAN AMERICAN: 26 mL/min — AB (ref >60)
GFR, EST NON AFRICAN AMERICAN: 22 mL/min — AB (ref >60)
Glucose, Bld: 88 mg/dL (ref 65–99)
Potassium: 4.3 mmol/L (ref 3.5–5.1)
SODIUM: 138 mmol/L (ref 135–145)

## 2017-08-07 LAB — CBC
HCT: 29.3 % — ABNORMAL LOW (ref 40.0–52.0)
HEMOGLOBIN: 9.8 g/dL — AB (ref 13.0–18.0)
MCH: 28.4 pg (ref 26.0–34.0)
MCHC: 33.5 g/dL (ref 32.0–36.0)
MCV: 84.8 fL (ref 80.0–100.0)
PLATELETS: 210 10*3/uL (ref 150–440)
RBC: 3.45 MIL/uL — AB (ref 4.40–5.90)
RDW: 14.3 % (ref 11.5–14.5)
WBC: 6.3 10*3/uL (ref 3.8–10.6)

## 2017-08-07 LAB — ECHOCARDIOGRAM COMPLETE
HEIGHTINCHES: 73 in
WEIGHTICAEL: 2140.8 [oz_av]

## 2017-08-07 LAB — MRSA PCR SCREENING: MRSA BY PCR: NEGATIVE

## 2017-08-07 LAB — TROPONIN I: Troponin I: 0.08 ng/mL (ref <0.03)

## 2017-08-07 MED ORDER — DEXTROSE 5 % IV SOLN
1.0000 g | INTRAVENOUS | Status: DC
  Administered 2017-08-07: 1 g via INTRAVENOUS
  Filled 2017-08-07 (×2): qty 10

## 2017-08-07 MED ORDER — CEFUROXIME AXETIL 250 MG PO TABS: 250.0000 mg | ORAL_TABLET | Freq: Two times a day (BID) | ORAL | 0 refills | Status: AC

## 2017-08-07 NOTE — Care Management Note (Signed)
Case Management Note  Patient Details  Name: DERAL SCHELLENBERG MRN: [id number] Date of Birth: 1946-05-07  Subjective/Objective:    Discussed discharge planning with Mr Nicole [facility] ph: [phone]. A referral for HH-PT was called to [facility] [place]. Mr Alden Hipp resides at [facility] in [place], [place] ph: [phone]. No other discharge needs identified.                 Action/Plan:   Expected Discharge Date:  08/07/17               Expected Discharge Plan:  Group Home  In-House Referral:     Discharge planning Services     Post Acute Care Choice:  Home Health Choice offered to:  [facility] POA / Guardian (legal Guardian Aubery Lapping [phone] chose [facility] provider)  DME Arranged:  N/A DME Agency:  NA  HH Arranged:  PT HH Agency:  [facility]  Status of Service:  Completed, signed off  If discussed at [place] of Stay Meetings, dates discussed:    Additional Comments:  Jaria Conway A, RN 08/07/2017, 10:29 AM

## 2017-08-07 NOTE — Discharge Summary (Signed)
[place] [facility] [place] NAME: Kyle Short    MR#:  [id number]  DATE OF BIRTH:  04-08-1946  DATE OF ADMISSION:  08/06/2017 ADMITTING PHYSICIAN: Gladstone Lighter, MD  DATE OF DISCHARGE: 08/07/2017  PRIMARY CARE PHYSICIAN: Olin Hauser, DO    ADMISSION DIAGNOSIS:  Near syncope [R55]  DISCHARGE DIAGNOSIS:  Active Problems:   Elevated troponin   SECONDARY DIAGNOSIS:   Past Medical History:  Diagnosis Date  . Alcohol abuse   . Alzheimer's dementia with behavioral disturbance   . Anemia of chronic disease   . Cardiomyopathy due to hypertension, without heart failure ([place])   . Chronic kidney disease (CKD) stage G3b/A1, moderately decreased glomerular filtration rate (GFR) between 30-44 mL/min/1.73 square meter and albuminuria creatinine ratio less than 30 mg/g   . Hyperlipidemia   . Hypertension   . Lives in group home   . Myocardial infarction ([place])   . Stroke [facility])     HOSPITAL COURSE:   Kyle Short  is a 71 y.o. 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   #1 generalized weakness-This was secondary to deconditioning, mild dehydration and also combined with underlying urinary tract infection. -Patient has been hydrated with IV fluids, started on IV antibiotics with ceftriaxone for the UTI. He was seen by physical therapy recommended home health services. Patient is being discharged to the group home with home health services on oral antibiotics for his UTI.  #2 elevated troponin- so supply demand ischemia secondary to patient's chronic renal insufficiency. - Patient's troponins did not turn upwards and remained flat. He is asymptomatic. - Echocardiogram has been done but the results of which are still pending.  #3  hypertension- pt. Will continue hydralazine, Imdur, Coreg  #4 Alzheimer's dementia-seems to be at baseline. Oriented 2.   #5 Hyperlipidemia - pt. Will resume his Atorvastatin.   #6. CKD Stage III - Cr. Close to baseline and can be further followed as outpatient.  DISCHARGE CONDITIONS:   Stable.   CONSULTS OBTAINED:    DRUG ALLERGIES:  No Known Allergies  DISCHARGE MEDICATIONS:   Allergies as of 08/07/2017   No Known Allergies     Medication List    STOP taking these medications   lisinopril 10 MG tablet Commonly known as:  PRINIVIL,ZESTRIL     TAKE these medications   ACCU-CHEK NANO SMARTVIEW w/Device Kit Use only for as needed glucose checks if symptomatic.   aspirin 81 MG chewable tablet Commonly known as:  ASPIRIN LOW DOSE Chew 1 tablet (81 mg total) by mouth daily.   atorvastatin 10 MG tablet Commonly known as:  LIPITOR Take 1 tablet (10 mg total) by mouth daily.   bismuth subsalicylate 372 MG chewable tablet Commonly known as:  PEPTO BISMOL Chew 262 mg by mouth daily as needed for indigestion or diarrhea or loose stools.   carvedilol 12.5 MG tablet Commonly known as:  COREG Take 1 tablet (12.5 mg total) by mouth 2 (two) times daily with a meal.   cefUROXime 250 MG tablet Commonly known as:  CEFTIN Take 1 tablet (250 mg total) by mouth 2 (two) times daily with a meal.   Cholecalciferol 2000 units Caps Commonly known as:  HM VITAMIN D3 Take 1 capsule (2,000 Units total) by mouth daily.   glucose blood test strip Commonly known  as:  ACCU-CHEK SMARTVIEW Check once as needed only if symptoms of hypoglycemia (dizziness, tremors, shaking)   hydrALAZINE 25 MG tablet Commonly known as:  APRESOLINE Take 1 tablet (25 mg total) by mouth 4 (four) times daily.   isosorbide mononitrate 30 MG 24 hr tablet Commonly known as:  IMDUR Take 1 tablet (30 mg total) by mouth daily.   Melatonin 10 MG Caps Take 10 mg by mouth at bedtime as needed.             Discharge Care Instructions        Start     Ordered   08/07/17 0000  cefUROXime (CEFTIN) 250 MG tablet  2 times daily with meals     08/07/17 1011   08/07/17 0000  Activity as tolerated - No restrictions     08/07/17 1011   08/07/17 0000  Diet - low sodium heart healthy     08/07/17 1011        DISCHARGE INSTRUCTIONS:   DIET:  Cardiac diet  DISCHARGE CONDITION:  Stable  ACTIVITY:  Activity as tolerated  OXYGEN:  Home Oxygen: No.   Oxygen Delivery: room air  DISCHARGE LOCATION:  group home with Home Health PT  If you experience worsening of your admission symptoms, develop shortness of breath, life threatening emergency, suicidal or homicidal thoughts you must seek medical attention immediately by calling 911 or calling your MD immediately  if symptoms less severe.  You Must read complete instructions/literature along with all the possible adverse reactions/side effects for all the Medicines you take and that have been prescribed to you. Take any new Medicines after you have completely understood and accpet all the possible adverse reactions/side effects.   Please note  You were cared for by a hospitalist during your hospital stay. If you have any questions about your discharge medications or the care you received while you were in the hospital after you are discharged, you can call the unit and asked to speak with the hospitalist on call if the hospitalist that took care of you is not available. Once you are discharged, your primary care physician will handle any further medical issues. Please note that NO REFILLS for any discharge medications will be authorized once you are discharged, as it is imperative that you return to your primary care physician (or establish a relationship with a primary care physician if you do not have one) for your aftercare needs so that they can reassess your need for medications and monitor your lab values.     Today   No acute events  overnight. No chest pain. Weakness has improved.  Noted to have a UTI and started on IV ceftiraxone and will get one dose today and discharge on Oral Ceftin.   VITAL SIGNS:  Blood pressure (!) 164/80, pulse 69, temperature (!) 97.4 F (36.3 C), temperature source Oral, resp. rate 18, height _0  (1.854 m), weight 60.7 kg (133 lb 12.8 oz), SpO2 99 %.  I/O:   Intake/Output Summary (Last 24 hours) at 08/07/17 1256 Last data filed at 08/07/17 0000  Gross per 24 hour  Intake              740 ml  Output                0 ml  Net              740 ml    PHYSICAL EXAMINATION:  GENERAL:  71 y.o.-year-old patient sitting up in  chair in no acute distress.  EYES: Pupils equal, round, reactive to light and accommodation. No scleral icterus. Extraocular muscles intact.  HEENT: Head atraumatic, normocephalic. Oropharynx and nasopharynx clear.  NECK:  Supple, no jugular venous distention. No thyroid enlargement, no tenderness.  LUNGS: Normal breath sounds bilaterally, no wheezing, rales,rhonchi. No use of accessory muscles of respiration.  CARDIOVASCULAR: S1, S2 normal. No murmurs, rubs, or gallops.  ABDOMEN: Soft, non-tender, non-distended. Bowel sounds present. No organomegaly or mass.  EXTREMITIES: No pedal edema, cyanosis, or clubbing.  NEUROLOGIC: Cranial nerves II through XII are intact. No focal motor or sensory defecits b/l.  PSYCHIATRIC: The patient is alert and oriented x 2.   SKIN: No obvious rash, lesion, or ulcer.   DATA REVIEW:   CBC  Recent Labs Lab 08/07/17 0421  WBC 6.3  HGB 9.8*  HCT 29.3*  PLT 210    Chemistries   Recent Labs Lab 08/07/17 0421  NA 138  K 4.3  CL 110  CO2 21*  GLUCOSE 88  BUN 42*  CREATININE 2.70*  CALCIUM 8.7*    Cardiac Enzymes  Recent Labs Lab 08/07/17 0421  TROPONINI 0.08*    Microbiology Results  Results for orders placed or performed during the hospital encounter of 08/06/17  MRSA PCR Screening     Status: None   Collection  Time: 08/07/17 12:00 AM  Result Value Ref Range Status   MRSA by PCR NEGATIVE NEGATIVE Final    Comment:        The GeneXpert MRSA Assay (FDA approved for NASAL specimens only), is one component of a comprehensive MRSA colonization surveillance program. It is not intended to diagnose MRSA infection nor to guide or monitor treatment for MRSA infections.     RADIOLOGY:  Dg Chest 2 View  Result Date: 08/06/2017 CLINICAL DATA:  Diaphoretic, weakness. History of ETOH abuse and substance abuse in the past. History of dementia. Current smoker. EXAM: CHEST  2 VIEW COMPARISON:  Chest x-ray dated 01/20/2016. FINDINGS: Heart size and mediastinal contours are stable. Coarse lung markings bilaterally suggests some degree of chronic interstitial lung disease. Lungs otherwise clear. No pleural effusion or pneumothorax seen. No acute or suspicious osseous finding. IMPRESSION: 1. No active cardiopulmonary disease. No evidence of pneumonia or pulmonary edema. 2. 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 Electronically Signed   By: Franki Cabot M.D.   On: 08/06/2017 13:12      Management plans discussed with the patient, family and they are in agreement.  CODE STATUS:     Code Status Orders        Start     Ordered   08/06/17 1635  Full code  Continuous     08/06/17 1634    Code Status History    Date Active Date Inactive Code Status Order ID Comments User Context   09/11/2015  2:02 PM 09/13/2015  6:57 PM Full Code [id number]  Theodoro Grist, MD ED   07/06/2015  3:12 PM 07/09/2015  2:13 PM Full Code [id number]  Vaughan Basta, MD Inpatient      TOTAL TIME TAKING CARE OF THIS PATIENT: 40 minutes.    Henreitta Leber M.D on 08/07/2017 at 12:56 PM  Between 7am to 6pm - Pager - [phone]  After 6pm go to [email or web address] - Proofreader  [facility]  Office  [phone]  CC: Primary care physician; Olin Hauser, DO

## 2017-08-07 NOTE — Evaluation (Signed)
Physical Therapy Evaluation Patient Details Name: Kyle Short MRN: [id number] DOB: 22-Aug-1946 Today's Date: 08/07/2017   History of Present Illness   70 y.o. 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 Admitted with sepsis, UTI  Clinical Impression  Pt able to participate with PT but needed regular cuing and assistance to fully perform all tasks.  He was very slow and struggled to get from supine to sitting and ultimately did need assist.  Pt was able to ambulate fully around the nurses' station with very slow, labored effort.  Pt not at baseline and may benefit from STR.     Follow Up Recommendations Home health PT    Equipment Recommendations       Recommendations for Other Services       Precautions / Restrictions Precautions Precautions: Fall Restrictions Weight Bearing Restrictions: No      Mobility  Bed Mobility Overal bed mobility: Needs Assistance Bed Mobility: Supine to Sit     Supine to sit: Min assist     General bed mobility comments: Pt very slow and labored with getting to EOB, showed good effort but ultimately needed assist to get LEs fully off bed and to square up while sitting  Transfers Overall transfer level: Needs assistance Equipment used: Rolling walker (2 wheeled) Transfers: Sit to/from Stand           General transfer comment: Pt unable to rise independently from standard height bed w/o cuing.  Once pt assisted with foot and hand placement and reminded him to keep weight forward he was able to rise with only CGA  Ambulation/Gait Ambulation/Gait assistance: Min guard Ambulation Distance (Feet): 200 Feet Assistive device: Rolling walker (2 wheeled)       General Gait Details: Pt with very slow, labored ambulation.  With PT direct assist to  advance walker he was able to increase cadence, but was not able to sustain this w/o direct assist.  Pt with no overt LOBs but was not as confident (or fast) as he reports his baseline is.   Stairs            Wheelchair Mobility    Modified Rankin (Stroke Patients Only)       Balance Overall balance assessment: Needs assistance   Sitting balance-Leahy Scale: Fair       Standing balance-Leahy Scale: Fair                               Pertinent Vitals/Pain Pain Assessment: No/denies pain    Home Living Family/patient expects to be discharged to:: Group home                      Prior Function Level of Independence: Independent with assistive device(s)         Comments: Pt states he walks down to dining and uses the bathroom w/o assist     Hand Dominance        Extremity/Trunk Assessment   Upper Extremity Assessment Upper Extremity Assessment: Generalized weakness;Overall [facility] for tasks assessed    Lower Extremity Assessment Lower Extremity Assessment: Generalized weakness;Overall WFL for tasks assessed       Communication   Communication: No difficulties  Cognition Arousal/Alertness: Awake/alert Behavior During Therapy: WFL for tasks assessed/performed Overall Cognitive Status:  Within Functional Limits for tasks assessed                                        General Comments      Exercises     Assessment/Plan    PT Assessment Patient needs continued PT services  PT Problem List Decreased strength;Decreased activity tolerance;Decreased balance;Decreased mobility;Decreased safety awareness       PT Treatment Interventions DME instruction;Gait training;Functional mobility training;Therapeutic exercise;Therapeutic activities;Balance training;Cognitive remediation;Patient/family education    PT Goals (Current goals can be found in the Care Plan section)  Acute Rehab PT Goals Patient Stated Goal: go back to  group home PT Goal Formulation: With patient Time For Goal Achievement: 08/21/17 Potential to Achieve Goals: Good    Frequency Min 2X/week   Barriers to discharge        Co-evaluation               AM-PAC PT "6 Clicks" Daily Activity  Outcome Measure Difficulty turning over in bed (including adjusting bedclothes, sheets and blankets)?: A Lot Difficulty moving from lying on back to sitting on the side of the bed? : Unable Difficulty sitting down on and standing up from a chair with arms (e.g., wheelchair, bedside commode, etc,.)?: A Lot Help needed moving to and from a bed to chair (including a wheelchair)?: A Little Help needed walking in hospital room?: A Little Help needed climbing 3-5 steps with a railing? : A Lot 6 Click Score: 13    End of Session Equipment Utilized During Treatment: Gait belt Activity Tolerance: Patient tolerated treatment well;Patient limited by fatigue Patient left: with chair alarm set;with call bell/phone within reach Nurse Communication: Mobility status PT Visit Diagnosis: Muscle weakness (generalized) (M62.81);Difficulty in walking, not elsewhere classified (R26.2)    Time: 5790-3833 PT Time Calculation (min) (ACUTE ONLY): 26 min   Charges:   PT Evaluation $PT Eval Low Complexity: 1 Low     PT G Codes:   PT G-Codes **NOT FOR INPATIENT CLASS** Functional Assessment Tool Used: AM-PAC 6 Clicks Basic Mobility Functional Limitation: Mobility: Walking and moving around Mobility: Walking and Moving Around Current Status (X8329): At least 40 percent but less than 60 percent impaired, limited or restricted Mobility: Walking and Moving Around Goal Status [phone]): At least 20 percent but less than 40 percent impaired, limited or restricted    Kreg Shropshire, DPT 08/07/2017, 10:38 AM

## 2017-08-07 NOTE — NC FL2 (Signed)
[place] LEVEL OF CARE SCREENING TOOL     IDENTIFICATION  Patient Name: Kyle Short Birthdate: 11/21/1946 Sex: male Admission Date (Current Location): 08/06/2017  [place] and [place] Number:  Engineering geologist and Address:  [facility], [address] [place], [place] [address]      Provider Number: [id number]  Attending Physician Name and Address:  Henreitta Leber, MD  Relative Name and Phone Number:       Current Level of Care: Hospital Recommended Level of Care: [facility] Prior Approval Number:    Date Approved/Denied: 01/16/13 PASRR Number: [id number]  Discharge Plan: Domiciliary (Rest home) (ALF/FCH)    Current Diagnoses: Patient Active Problem List   Diagnosis Date Noted  . Elevated troponin 08/06/2017  . Stroke ([place]) 02/10/2017  . Lives in group home 02/10/2017  . Secondary cardiomyopathy ([place]) 10/14/2015  . Myocardial infarction ([place]) 10/14/2015  . History of orthostatic hypotension 09/11/2015  . Syncope 09/11/2015  . Chronic kidney disease, stage 4, severely decreased GFR ([facility]) 07/26/2015  . Benign essential HTN 07/26/2015  . 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 H/O alcohol abuse 07/25/2015  . HLD (hyperlipidemia) 07/25/2015  . Cerebrovascular accident, old 07/25/2015  . Alzheimer's dementia with behavioral disturbance 06/07/2015  . Cardiomyopathy ([place]) 06/07/2015  . Current tobacco use 02/26/2015    Orientation RESPIRATION BLADDER Height & Weight     Self, Situation, Place  Normal Continent Weight: 133 lb 12.8 oz (60.7 kg) Height:  _0  (185.4 cm)  BEHAVIORAL SYMPTOMS/MOOD NEUROLOGICAL BOWEL NUTRITION STATUS      Continent Diet (Heart Healthy)  AMBULATORY STATUS COMMUNICATION OF NEEDS Skin   Supervision Verbally Normal                       Personal Care Assistance Level of Assistance  Bathing, Feeding, Dressing Bathing Assistance:  Independent Feeding assistance: Independent Dressing Assistance: Independent     Functional Limitations Info             SPECIAL CARE FACTORS FREQUENCY  PT (By licensed PT)     PT Frequency: TBD by [place], by licences PT              Contractures Contractures Info: Not present    Additional Factors Info  Code Status, Allergies Code Status Info: Full Allergies Info: NKA           Current Medications (08/07/2017):  This is the current hospital active medication list Current Facility-Administered Medications  Medication Dose Route Frequency Provider Last Rate Last Dose  . acetaminophen (TYLENOL) tablet 650 mg  650 mg Oral Q6H PRN Gladstone Lighter, MD       Or  . acetaminophen (TYLENOL) suppository 650 mg  650 mg Rectal Q6H PRN Gladstone Lighter, MD      . aspirin chewable tablet 81 mg  81 mg Oral Daily Gladstone Lighter, MD   81 mg at 08/07/17 1157  . atorvastatin (LIPITOR) tablet 10 mg  10 mg Oral Daily Gladstone Lighter, MD   10 mg at 08/07/17 1157  . carvedilol (COREG) tablet 12.5 mg  12.5 mg Oral BID WC Gladstone Lighter, MD   12.5 mg at 08/07/17 0841  . cefTRIAXone (ROCEPHIN) 1 g in dextrose 5 % 50 mL IVPB  1 g Intravenous Q24H Henreitta Leber, MD 100 mL/hr at 08/07/17 1158 1 g at 08/07/17 1158  . cholecalciferol (VITAMIN D) tablet 2,000 Units  2,000 Units Oral Daily Gladstone Lighter, MD   1,000 Units at 08/07/17 1156  . enoxaparin (LOVENOX) injection 30 mg  30 mg Subcutaneous Q24H Gladstone Lighter, MD   30 mg at 08/06/17 2128  . hydrALAZINE (APRESOLINE) tablet 25 mg  25 mg Oral QID Gladstone Lighter, MD   25 mg at 08/07/17 1157  . isosorbide mononitrate (IMDUR) 24 hr tablet 30 mg  30 mg Oral Daily Gladstone Lighter, MD   30 mg at 08/07/17 1156  . Melatonin TABS 10 mg  10 mg Oral QHS PRN Gladstone Lighter, MD      . ondansetron (ZOFRAN) tablet 4 mg  4 mg Oral Q6H PRN Gladstone Lighter, MD       Or  . ondansetron (ZOFRAN) injection 4 mg  4  mg Intravenous Q6H PRN Gladstone Lighter, MD         Discharge Medications: Medication List             STOP taking these medications            lisinopril 10 MG tablet Commonly known as:  PRINIVIL,ZESTRIL                       TAKE these medications            ACCU-CHEK NANO SMARTVIEW w/Device Kit Use only for as needed glucose checks if symptomatic.    aspirin 81 MG chewable tablet Commonly known as:  ASPIRIN LOW DOSE Chew 1 tablet (81 mg total) by mouth daily.    atorvastatin 10 MG tablet Commonly known as:  LIPITOR Take 1 tablet (10 mg total) by mouth daily.    bismuth subsalicylate 756 MG chewable tablet Commonly known as:  PEPTO BISMOL Chew 262 mg by mouth daily as needed for indigestion or diarrhea or loose stools.    carvedilol 12.5 MG tablet Commonly known as:  COREG Take 1 tablet (12.5 mg total) by mouth 2 (two) times daily with a meal.    cefUROXime 250 MG tablet Commonly known as:  CEFTIN Take 1 tablet (250 mg total) by mouth 2 (two) times daily with a meal.    Cholecalciferol 2000 units Caps Commonly known as:  HM VITAMIN D3 Take 1 capsule (2,000 Units total) by mouth daily.    glucose blood test strip Commonly known as:  ACCU-CHEK SMARTVIEW Check once as needed only if symptoms of hypoglycemia (dizziness, tremors, shaking)    hydrALAZINE 25 MG tablet Commonly known as:  APRESOLINE Take 1 tablet (25 mg total) by mouth 4 (four) times daily.    isosorbide mononitrate 30 MG 24 hr tablet Commonly known as:  IMDUR Take 1 tablet (30 mg total) by mouth daily.    Melatonin 10 MG Caps Take 10 mg by mouth at bedtime as needed.      Relevant Imaging Results:  Relevant Lab Results:   Additional Information SS# [id number]  Zettie Pho, LCSW

## 2017-08-07 NOTE — Clinical Social Work Note (Signed)
Clinical Social Work Assessment  Patient Details  Name: Kyle Short MRN: [id number] Date of Birth: 06-18-1946  Date of referral:  08/07/17               Reason for consult:  Facility Placement                Permission sought to share information with:  Facility Sport and exercise psychologist Permission granted to share information::     Name::        Agency::  [facility]  Relationship::     Contact Information:  [phone]  Housing/Transportation Living arrangements for the past 2 months:  [place] of Information:  Medical Team, Facility Patient Interpreter Needed:  None Criminal Activity/Legal Involvement Pertinent to Current Situation/Hospitalization:    Significant Relationships:  Warehouse manager Lives with:  Facility Resident Do you feel safe going back to the place where you live?  Yes Need for family participation in patient care:  No (Coment)  Care giving concerns:  Patient admitted from group home [facility])   Education officer, museum assessment / plan:  The CSW contacted the patient's HCPOA, Davy Pique, to discuss discharge planning. Sonya agreed with the patient returning to the group home/ALF with HHPT. Davy Pique indicated that she has no preference for which agency. CSW updated RNCM of this information.  The CSW contacted Madelynn Done at the group home to update on discharge for today. Madelynn Done will be able to transport the patient at 3:30 pm. CSW will deliver discharge packet to the chart and continue to follow for any additional discharge needs.   Employment status:  Retired Forensic scientist:  Medicare PT Recommendations:  Home with [place] / Referral to community resources:     Patient/Family's Response to care:  The patient and his HCPOA were pleasant and thanked the CSW.  Patient/Family's Understanding of and Emotional Response to Diagnosis, Current Treatment, and Prognosis:  The HCPOA understands the recommendation for HHPT and is in  agreement.  Emotional Assessment Appearance:  Appears stated age Attitude/Demeanor/Rapport:   (Pleasant) Affect (typically observed):  Appropriate, Pleasant Orientation:  Oriented to Self Alcohol / Substance use:  Alcohol Use, Illicit Drugs (Past history. Abstinent for over 12 months.) Psych involvement (Current and /or in the community):  No (Comment)  Discharge Needs  Concerns to be addressed:  Care Coordination, Discharge Planning Concerns Readmission within the last 30 days:  No Current discharge risk:  None Barriers to Discharge:  No Barriers Identified   Zettie Pho, LCSW 08/07/2017, 10:16 AM

## 2017-08-07 NOTE — Progress Notes (Signed)
*  PRELIMINARY RESULTS* Echocardiogram 2D Echocardiogram has been performed.  Kyle Short Kyle Short 08/07/2017, 11:43 AM

## 2017-08-07 NOTE — Progress Notes (Signed)
Went over discharge instructions with the group home representative. Discontinue PIV and telemetry monitoring per Janett Billow, [place]. Wheeled patient down for discharge.

## 2017-08-09 DIAGNOSIS — N2581 Secondary hyperparathyroidism of renal origin: Secondary | ICD-10-CM | POA: Diagnosis not present

## 2017-08-09 DIAGNOSIS — N184 Chronic kidney disease, stage 4 (severe): Secondary | ICD-10-CM | POA: Diagnosis not present

## 2017-08-09 DIAGNOSIS — I129 Hypertensive chronic kidney disease with stage 1 through stage 4 chronic kidney disease, or unspecified chronic kidney disease: Secondary | ICD-10-CM | POA: Diagnosis not present

## 2017-08-09 DIAGNOSIS — D631 Anemia in chronic kidney disease: Secondary | ICD-10-CM | POA: Diagnosis not present

## 2017-08-09 DIAGNOSIS — E875 Hyperkalemia: Secondary | ICD-10-CM | POA: Diagnosis not present

## 2017-09-14 ENCOUNTER — Telehealth: Payer: Self-pay | Admitting: Family Medicine

## 2017-09-14 NOTE — Telephone Encounter (Signed)
Erin, from [employer] called  Needing verbal   2 weeks  For  3 week.  Erin call back  # [phone]

## 2017-09-14 NOTE — Telephone Encounter (Signed)
LMTCB

## 2017-09-15 NOTE — Telephone Encounter (Signed)
Left detail message with verbal given.

## 2017-09-23 ENCOUNTER — Ambulatory Visit: Payer: Self-pay | Admitting: Family Medicine

## 2017-09-30 DIAGNOSIS — F322 Major depressive disorder, single episode, severe without psychotic features: Secondary | ICD-10-CM | POA: Diagnosis not present

## 2017-09-30 DIAGNOSIS — F411 Generalized anxiety disorder: Secondary | ICD-10-CM | POA: Diagnosis not present

## 2017-10-06 ENCOUNTER — Encounter: Payer: Self-pay | Admitting: Family Medicine

## 2017-10-06 ENCOUNTER — Ambulatory Visit (INDEPENDENT_AMBULATORY_CARE_PROVIDER_SITE_OTHER): Payer: Medicare Other | Admitting: Family Medicine

## 2017-10-06 VITALS — BP 142/78 | HR 57 | Temp 97.6°F | Resp 16 | Ht 73.0 in | Wt 150.0 lb

## 2017-10-06 DIAGNOSIS — N184 Chronic kidney disease, stage 4 (severe): Secondary | ICD-10-CM | POA: Diagnosis not present

## 2017-10-06 DIAGNOSIS — E1122 Type 2 diabetes mellitus with diabetic chronic kidney disease: Secondary | ICD-10-CM | POA: Diagnosis not present

## 2017-10-06 DIAGNOSIS — I10 Essential (primary) hypertension: Secondary | ICD-10-CM | POA: Diagnosis not present

## 2017-10-06 DIAGNOSIS — F1021 Alcohol dependence, in remission: Secondary | ICD-10-CM | POA: Diagnosis not present

## 2017-10-06 DIAGNOSIS — F0281 Dementia in other diseases classified elsewhere with behavioral disturbance: Secondary | ICD-10-CM

## 2017-10-06 DIAGNOSIS — B351 Tinea unguium: Secondary | ICD-10-CM

## 2017-10-06 DIAGNOSIS — L602 Onychogryphosis: Secondary | ICD-10-CM | POA: Diagnosis not present

## 2017-10-06 DIAGNOSIS — Z23 Encounter for immunization: Secondary | ICD-10-CM | POA: Diagnosis not present

## 2017-10-06 DIAGNOSIS — G308 Other Alzheimer's disease: Secondary | ICD-10-CM

## 2017-10-06 MED ORDER — HYDRALAZINE HCL 25 MG PO TABS: 25.0000 mg | ORAL_TABLET | Freq: Two times a day (BID) | ORAL | 0 refills | Status: DC | PRN

## 2017-10-06 NOTE — Assessment & Plan Note (Signed)
Stable without evidence of worsening. Considered FAST Stage 6, dependent on most ADLs, but can feed independently by report. Has some mobility but is limited. - Some prior concerns wandering, but not problematic or severe - No concerns disruptive or violent behavior - Not on dementia medications  Plan: 1. No change to medications - will not start therapy for memory at this time as reviewed before likely potential for more harm with side effects than benefit given reduced cognitive function currently 2. Continue current care at family care home with close supervision, total care, written updated order 3. Again advised to contact [facility] - Handout given with resources - they are waiting on call back, has been months 4. Consider future referral to Neurology as indicated if worsening, consider medications, however clinically does not seem like ideal candidate as meds may help memory but limited benefit for other aspects of illness 5. Follow-up 4 months routine

## 2017-10-06 NOTE — Assessment & Plan Note (Signed)
Mild elevated BP today, improved on re-check. Concern history of orthostatic hypotension, no further episodes since - Followed by Cardiology, Nephrology  1. Agree to add one PRN dose of Hydralazine 25mg  BID PRN only if BP >160/100, otherwise he can continue current meds Hydralazine 25mg  QID, Carvedilol 12.5mg  BID, Imdur 30mg  (24 hr) 2. Encouraged improve hydration, caution sudden standing / change of position, fall precaution, some postural dizziness may occur 3. Monitor BP at home

## 2017-10-06 NOTE — Assessment & Plan Note (Signed)
Former history of alcohol abuse, has been sober for years Likely contributing to his current dementia

## 2017-10-06 NOTE — Patient Instructions (Addendum)
Thank you for coming to the clinic today.  1.  Referral placed to [facility] - if you don't hear back go ahead and call them next week  [facility] Address: [address], [place], [place] [address]  Phone: [phone]  Website: [email or web address]  ----------------------------------------------------- [facility] Address: [address] [place], [place] [address] Hours: Open 8AM-5PM Phone: [phone]   2.  [facility] Lutricia Horsfall (OT, Dementia Specialist) [place], [place] [address] Ph [phone] [email or web address] - Navigate home and facility services for patients, caregiver support & resources  3. Written new rx for Hydralazine 25mg  take up to 2 times daily as needed for elevated BP >160/100  Please schedule a Follow-up Appointment to: Return in about 4 months (around 02/06/2018) for DM A1c, HTN, Dementia.  If you have any other questions or concerns, please feel free to call the clinic or send a message through [facility]. You may also schedule an earlier appointment if necessary.  Additionally, you may be receiving a survey about your experience at our clinic within a few days to 1 week by e-mail or mail. We value your feedback.  Nobie Putnam, DO [place]

## 2017-10-06 NOTE — Assessment & Plan Note (Signed)
Stable, previously controlled No changes Diet improved, weight gain Will defer A1c until next visit 4 months A1c POC

## 2017-10-06 NOTE — Progress Notes (Signed)
Subjective:    Patient ID: Kyle Short, male    DOB: 08/06/1946, 71 y.o.   MRN: [id number]  Kyle Short is a 71 y.o. male presenting on 10/06/2017 for Hypertension (follow up)  Patient accompanied by Melida Quitter (contact, [phone], owner of care home) [facility] [place].  HPI   CHRONIC HTN: Reports concern with BP at group home he has had variable readings often normal range for him 130-140, but will have several readings with SBP up to 150-160. They are asking about PRN med. Seems to be asymptomatic during this time - Followed by Cardiology, Nephrology Current Meds - Hydralazine 25 QID, Coreg 12.5 BID, Imdur-24 hr 30mg  daily Reports good compliance, took meds today. Tolerating well, w/o complaints. Lifestyle - Limited activity, some walking with assistance Denies CP, dyspnea, HA, edema, dizziness / lightheadedness  CHRONIC DM, Type 2: Reports no concerns, well controlled problem never on DM medications or insulin. Last A1c 6.5 CBGs: Unremarkable, no low readings. Meds: None No longer on ACEi Complication with sensory ataxia per Neurology Due for DM eye exam needs referral. Due for DM Foot Denies hypoglycemia, polyuria, visual changes, numbness or tingling.  Alzheimer's Dementia with mild behavioral disturbance, history of CVA - Last visit with me 05/13/17, for same problem dementia, treated with detailed discussion diagnosis and management, not indicated for medication at this time, referral to hospice [facility], see prior notes for background information. - Interval update with Madelynn Done called the Dementia Care Services but they did not make arrangements or follow-up with the patient, he will try again - Today patient/caregiver reports no new concerns. His mental status is unchanged from last visit. They do not endorse any cognitive or functional decline. No problems with falls. He is tolerating PO well and has had some weight gain  actually. - Denies agitation, violence, wandering, pain  PMH Alcohol Abuse in remission - has been sober for years.  Health Maintenance: - Due for Flu Shot, will receive today - Due for 2nd dose pneumonia vaccine age > 88 - will receive Prevnar-13 today (received opposite order, pneumovax23 on 10/14/15)  Depression screen [facility] 2/9 02/24/2017 12/02/2015 10/14/2015  Decreased Interest 0 0 0  Down, Depressed, Hopeless 0 0 0  PHQ - 2 Score 0 0 0    Social History  Substance Use Topics  . Smoking status: Current Every Day Smoker    Packs/day: 0.25    Types: Cigarettes  . Smokeless tobacco: Current User  . Alcohol use No    Review of Systems Per HPI unless specifically indicated above     Objective:    BP (!) 142/78 (BP Location: Left Arm, Cuff Size: Normal)   Pulse (!) 57   Temp 97.6 F (36.4 C) (Oral)   Resp 16   Ht 6\' 1"  (1.854 m)   Wt 150 lb (68 kg)   BMI 19.79 kg/m   Wt Readings from Last 3 Encounters:  10/06/17 150 lb (68 kg)  08/06/17 133 lb 12.8 oz (60.7 kg)  05/13/17 148 lb (67.1 kg)    Physical Exam  Constitutional: He appears well-developed and well-nourished. No distress.  Elderly appearing 71 year male, currently well-appearing, comfortable, cooperative.  HENT:  Head: Normocephalic and atraumatic.  Mouth/Throat: Oropharynx is clear and moist.  Poor dentition without any permanent teeth. No dentures currently  Eyes: Pupils are equal, round, and reactive to light. Conjunctivae and EOM are normal.  Arcus senilis bilateral  Neck: Normal range of motion. Neck supple.  Cardiovascular: Normal rate, regular rhythm, normal heart sounds and intact distal pulses.   No murmur heard. Pulmonary/Chest: Effort normal and breath sounds normal. No respiratory distress. He has no wheezes. He has no rales.  Abdominal: Soft. Bowel sounds are normal. He exhibits no distension. There is no tenderness.  Musculoskeletal: Normal range of motion. He exhibits no edema.   Neurological: He is alert. Coordination normal.  Skin: Skin is warm and dry. No rash noted. He is not diaphoretic.  See DM foot exam for toenails below  Psychiatric: He has a normal mood and affect. His behavior is normal.  Limited verbal during history, speech is appropriate but sparse. Participates in exam and follows commands well. Limited insight into condition.  Nursing note and vitals reviewed.    Diabetic Foot Exam - Simple   Simple Foot Form Diabetic Foot exam was performed with the following findings:  Yes 10/06/2017 11:45 AM  Visual Inspection See comments:  Yes Sensation Testing Intact to touch and monofilament testing bilaterally:  Yes Pulse Check Posterior Tibialis and Dorsalis pulse intact bilaterally:  Yes Comments Only abnormality limited to toenails bilaterally with thick onychomycosis great toenails and all toenails are thick dry overgrown curved inward. No evidence of secondary infection. No ulceration.     Results for orders placed or performed during the hospital encounter of 08/06/17  MRSA PCR Screening  Result Value Ref Range   MRSA by PCR NEGATIVE NEGATIVE  Basic metabolic panel  Result Value Ref Range   Sodium 138 135 - 145 mmol/L   Potassium 4.4 3.5 - 5.1 mmol/L   Chloride 108 101 - 111 mmol/L   CO2 22 22 - 32 mmol/L   Glucose, Bld 105 (H) 65 - 99 mg/dL   BUN 45 (H) 6 - 20 mg/dL   Creatinine, Ser 3.16 (H) 0.61 - 1.24 mg/dL   Calcium 9.4 8.9 - 10.3 mg/dL   GFR calc non Af Amer 19 (L) >60 mL/min   GFR calc Af Amer 22 (L) >60 mL/min   Anion gap 8 5 - 15  CBC  Result Value Ref Range   WBC 5.1 3.8 - 10.6 K/uL   RBC 3.90 (L) 4.40 - 5.90 MIL/uL   Hemoglobin 11.0 (L) 13.0 - 18.0 g/dL   HCT 33.5 (L) 40.0 - 52.0 %   MCV 85.8 80.0 - 100.0 fL   MCH 28.2 26.0 - 34.0 pg   MCHC 32.9 32.0 - 36.0 g/dL   RDW 14.2 11.5 - 14.5 %   Platelets 228 150 - 440 K/uL  Urinalysis, Complete w Microscopic  Result Value Ref Range   Color, Urine YELLOW (A) YELLOW    APPearance CLOUDY (A) CLEAR   Specific Gravity, Urine 1.013 1.005 - 1.030   pH 5.0 5.0 - 8.0   Glucose, UA NEGATIVE NEGATIVE mg/dL   Hgb urine dipstick NEGATIVE NEGATIVE   Bilirubin Urine NEGATIVE NEGATIVE   Ketones, ur NEGATIVE NEGATIVE mg/dL   Protein, ur NEGATIVE NEGATIVE mg/dL   Nitrite POSITIVE (A) NEGATIVE   Leukocytes, UA LARGE (A) NEGATIVE   RBC / HPF 0-5 0 - 5 RBC/hpf   WBC, UA TOO NUMEROUS TO COUNT 0 - 5 WBC/hpf   Bacteria, UA RARE (A) NONE SEEN   Squamous Epithelial / LPF 0-5 (A) NONE SEEN   WBC Clumps PRESENT    Mucus PRESENT   Troponin I  Result Value Ref Range   Troponin I 0.07 (HH) <0.03 ng/mL  Glucose, capillary  Result Value Ref Range   Glucose-Capillary  93 65 - 99 mg/dL  Troponin I  Result Value Ref Range   Troponin I 0.07 (HH) <0.03 ng/mL  Troponin I  Result Value Ref Range   Troponin I 0.07 (HH) <0.03 ng/mL  Troponin I  Result Value Ref Range   Troponin I 0.08 (HH) <0.03 ng/mL  TSH  Result Value Ref Range   TSH 3.814 0.350 - 4.500 uIU/mL  Hemoglobin A1c  Result Value Ref Range   Hgb A1c MFr Bld 6.5 (H) 4.8 - 5.6 %   Mean Plasma Glucose 139.85 mg/dL  Basic metabolic panel  Result Value Ref Range   Sodium 138 135 - 145 mmol/L   Potassium 4.3 3.5 - 5.1 mmol/L   Chloride 110 101 - 111 mmol/L   CO2 21 (L) 22 - 32 mmol/L   Glucose, Bld 88 65 - 99 mg/dL   BUN 42 (H) 6 - 20 mg/dL   Creatinine, Ser 2.70 (H) 0.61 - 1.24 mg/dL   Calcium 8.7 (L) 8.9 - 10.3 mg/dL   GFR calc non Af Amer 22 (L) >60 mL/min   GFR calc Af Amer 26 (L) >60 mL/min   Anion gap 7 5 - 15  CBC  Result Value Ref Range   WBC 6.3 3.8 - 10.6 K/uL   RBC 3.45 (L) 4.40 - 5.90 MIL/uL   Hemoglobin 9.8 (L) 13.0 - 18.0 g/dL   HCT 29.3 (L) 40.0 - 52.0 %   MCV 84.8 80.0 - 100.0 fL   MCH 28.4 26.0 - 34.0 pg   MCHC 33.5 32.0 - 36.0 g/dL   RDW 14.3 11.5 - 14.5 %   Platelets 210 150 - 440 K/uL  ECHOCARDIOGRAM COMPLETE  Result Value Ref Range   Weight 2,140.8 oz   Height 73 in   BP 164/80  mmHg      Assessment & Plan:   Problem List Items Addressed This Visit    Alcohol dependence in remission [facility])    Former history of alcohol abuse, has been sober for years Likely contributing to his current dementia      Alzheimer's dementia with behavioral disturbance    Stable without evidence of worsening. Considered FAST Stage 6, dependent on most ADLs, but can feed independently by report. Has some mobility but is limited. - Some prior concerns wandering, but not problematic or severe - No concerns disruptive or violent behavior - Not on dementia medications  Plan: 1. No change to medications - will not start therapy for memory at this time as reviewed before likely potential for more harm with side effects than benefit given reduced cognitive function currently 2. Continue current care at family care home with close supervision, total care, written updated order 3. Again advised to contact [facility] - Handout given with resources - they are waiting on call back, has been months 4. Consider future referral to Neurology as indicated if worsening, consider medications, however clinically does not seem like ideal candidate as meds may help memory but limited benefit for other aspects of illness 5. Follow-up 4 months routine      Benign essential HTN    Mild elevated BP today, improved on re-check. Concern history of orthostatic hypotension, no further episodes since - Followed by Cardiology, Nephrology  1. Agree to add one PRN dose of Hydralazine 25mg  BID PRN only if BP >160/100, otherwise he can continue current meds Hydralazine 25mg  QID, Carvedilol 12.5mg  BID, Imdur 30mg  (24 hr) 2. Encouraged improve hydration, caution sudden standing / change of  position, fall precaution, some postural dizziness may occur 3. Monitor BP at home      Relevant Medications   hydrALAZINE (APRESOLINE) 25 MG tablet   Type 2 diabetes mellitus with chronic kidney disease ([place]) -  Primary    Stable, previously controlled No changes Diet improved, weight gain Referral to [facility] for routine DM eye exams Will defer A1c until next visit 4 months A1c POC      Relevant Orders   Ambulatory referral to Ophthalmology   Ambulatory referral to Podiatry    Other Visit Diagnoses    Needs flu shot       Relevant Orders   Flu vaccine HIGH DOSE PF (Fluzone High dose) (Completed)   Need for vaccination with 13-polyvalent pneumococcal conjugate vaccine       Relevant Orders   Pneumococcal conjugate vaccine 13-valent IM (Completed)   Overgrown toenails       Relevant Orders   Ambulatory referral to Podiatry   Onychomycosis of toenail       Relevant Orders   Ambulatory referral to Podiatry      Meds ordered this encounter  Medications  . hydrALAZINE (APRESOLINE) 25 MG tablet    Sig: Take 1 tablet (25 mg total) by mouth 2 (two) times daily as needed (Elevated BP >160/100).    Dispense:  30 tablet    Refill:  0    Follow up plan: Return in about 4 months (around 02/06/2018) for DM A1c, HTN, Dementia.  Nobie Putnam, [facility] 10/06/2017, 1:26 PM

## 2017-10-07 DIAGNOSIS — F411 Generalized anxiety disorder: Secondary | ICD-10-CM | POA: Diagnosis not present

## 2017-10-07 DIAGNOSIS — F322 Major depressive disorder, single episode, severe without psychotic features: Secondary | ICD-10-CM | POA: Diagnosis not present

## 2017-10-14 ENCOUNTER — Ambulatory Visit: Payer: Self-pay | Admitting: Podiatry

## 2017-10-14 DIAGNOSIS — F411 Generalized anxiety disorder: Secondary | ICD-10-CM | POA: Diagnosis not present

## 2017-10-14 DIAGNOSIS — F322 Major depressive disorder, single episode, severe without psychotic features: Secondary | ICD-10-CM | POA: Diagnosis not present

## 2017-10-21 DIAGNOSIS — F411 Generalized anxiety disorder: Secondary | ICD-10-CM | POA: Diagnosis not present

## 2017-10-21 DIAGNOSIS — F322 Major depressive disorder, single episode, severe without psychotic features: Secondary | ICD-10-CM | POA: Diagnosis not present

## 2017-10-28 DIAGNOSIS — F411 Generalized anxiety disorder: Secondary | ICD-10-CM | POA: Diagnosis not present

## 2017-10-28 DIAGNOSIS — F322 Major depressive disorder, single episode, severe without psychotic features: Secondary | ICD-10-CM | POA: Diagnosis not present

## 2017-11-07 DIAGNOSIS — F411 Generalized anxiety disorder: Secondary | ICD-10-CM | POA: Diagnosis not present

## 2017-11-07 DIAGNOSIS — F322 Major depressive disorder, single episode, severe without psychotic features: Secondary | ICD-10-CM | POA: Diagnosis not present

## 2017-11-11 DIAGNOSIS — F322 Major depressive disorder, single episode, severe without psychotic features: Secondary | ICD-10-CM | POA: Diagnosis not present

## 2017-11-11 DIAGNOSIS — F411 Generalized anxiety disorder: Secondary | ICD-10-CM | POA: Diagnosis not present

## 2017-11-17 DIAGNOSIS — N2581 Secondary hyperparathyroidism of renal origin: Secondary | ICD-10-CM | POA: Diagnosis not present

## 2017-11-17 DIAGNOSIS — E872 Acidosis: Secondary | ICD-10-CM | POA: Diagnosis not present

## 2017-11-17 DIAGNOSIS — I129 Hypertensive chronic kidney disease with stage 1 through stage 4 chronic kidney disease, or unspecified chronic kidney disease: Secondary | ICD-10-CM | POA: Diagnosis not present

## 2017-11-17 DIAGNOSIS — E875 Hyperkalemia: Secondary | ICD-10-CM | POA: Diagnosis not present

## 2017-11-17 DIAGNOSIS — N184 Chronic kidney disease, stage 4 (severe): Secondary | ICD-10-CM | POA: Diagnosis not present

## 2017-11-18 DIAGNOSIS — F411 Generalized anxiety disorder: Secondary | ICD-10-CM | POA: Diagnosis not present

## 2017-11-18 DIAGNOSIS — F322 Major depressive disorder, single episode, severe without psychotic features: Secondary | ICD-10-CM | POA: Diagnosis not present

## 2017-11-24 ENCOUNTER — Telehealth: Payer: Self-pay | Admitting: Family Medicine

## 2017-11-24 DIAGNOSIS — F02818 Dementia in other diseases classified elsewhere, unspecified severity, with other behavioral disturbance: Secondary | ICD-10-CM

## 2017-11-24 DIAGNOSIS — F0281 Dementia in other diseases classified elsewhere with behavioral disturbance: Secondary | ICD-10-CM

## 2017-11-24 DIAGNOSIS — G308 Other Alzheimer's disease: Principal | ICD-10-CM

## 2017-11-24 NOTE — Telephone Encounter (Signed)
Care taker called wanted to  Know if you would call something in for  Pt  Behavorial. Care taker call back  # is  [phone]

## 2017-11-24 NOTE — Telephone Encounter (Signed)
Patient is currently residing at [facility] ([place]), contact person is owner of home, Melida Quitter (contact, [phone])  Could you call to clarify what the concern is? This patient has history of Dementia, and this will cause some behavior issues. If needed I can call him back later to discuss, or possibly may need to bring him back to office if it is a significant worsening new problem.  Nobie Putnam, DO [facility] 11/24/2017, 1:43 PM

## 2017-11-25 DIAGNOSIS — F411 Generalized anxiety disorder: Secondary | ICD-10-CM | POA: Diagnosis not present

## 2017-11-25 DIAGNOSIS — F322 Major depressive disorder, single episode, severe without psychotic features: Secondary | ICD-10-CM | POA: Diagnosis not present

## 2017-11-25 MED ORDER — DONEPEZIL HCL 5 MG PO TABS: 5.0000 mg | ORAL_TABLET | Freq: Every day | ORAL | 5 refills | Status: DC

## 2017-11-25 NOTE — Telephone Encounter (Signed)
Reviewed chart, previously we agreed to not rx Dementia med since his cognitive function has been appropriate. However given variety of symptoms, also refusing to take meds at times. Agreed to try initial dementia therapy with Donepezil (Aricept) 5mg  daily #30 +5 refills trial, we can review at next follow-up visit to see if progress.  Called him back LVM that he may switch Aricept to MORNING instead of PM if he refuses.  Nobie Putnam, [facility] 11/25/2017, 11:05 AM

## 2017-11-25 NOTE — Telephone Encounter (Signed)
Mr. Kyle Short states pateint is not on any medication for dementia and patient refuses to take his medication at bed time but has no issue taking in morning and sometime gets dizzy while walking his B/P yesterday was 150/82. Mr. Kyle Short wants a Rx to send for dementia.

## 2017-12-02 DIAGNOSIS — F411 Generalized anxiety disorder: Secondary | ICD-10-CM | POA: Diagnosis not present

## 2017-12-02 DIAGNOSIS — F322 Major depressive disorder, single episode, severe without psychotic features: Secondary | ICD-10-CM | POA: Diagnosis not present

## 2017-12-09 DIAGNOSIS — F411 Generalized anxiety disorder: Secondary | ICD-10-CM | POA: Diagnosis not present

## 2017-12-09 DIAGNOSIS — F322 Major depressive disorder, single episode, severe without psychotic features: Secondary | ICD-10-CM | POA: Diagnosis not present

## 2017-12-13 ENCOUNTER — Ambulatory Visit: Payer: Medicare Other | Admitting: Podiatry

## 2017-12-13 ENCOUNTER — Encounter: Payer: Self-pay | Admitting: Podiatry

## 2017-12-13 DIAGNOSIS — M79676 Pain in unspecified toe(s): Secondary | ICD-10-CM | POA: Diagnosis not present

## 2017-12-13 DIAGNOSIS — M79675 Pain in left toe(s): Principal | ICD-10-CM

## 2017-12-13 DIAGNOSIS — F1011 Alcohol abuse, in remission: Secondary | ICD-10-CM | POA: Insufficient documentation

## 2017-12-13 DIAGNOSIS — S0990XA Unspecified injury of head, initial encounter: Secondary | ICD-10-CM | POA: Insufficient documentation

## 2017-12-13 DIAGNOSIS — B351 Tinea unguium: Secondary | ICD-10-CM | POA: Diagnosis not present

## 2017-12-13 DIAGNOSIS — M79674 Pain in right toe(s): Principal | ICD-10-CM

## 2017-12-13 NOTE — Progress Notes (Signed)
   Subjective:    Patient ID: Kyle Short, male    DOB: 31-Jan-1946, 71 y.o.   MRN: [id number]  HPIthis patient presents the office with chief complaint of long thick painful nails.  These nails are painful walking and wearing his shoes.  Patient is a diabetic.  He presents the office today for an evaluation and treatment of his long nails    Review of Systems     Objective:   Physical Exam General Appearance  Alert, conversant and in no acute stress.  Vascular  Dorsalis pedis and posterior pulses are palpable  bilaterally.  Capillary return is within normal limits  bilaterally. Temperature is within normal limits  Bilaterally.  Neurologic  Senn-Weinstein monofilament wire test within normal limits  bilaterally. Muscle power within normal limits bilaterally.  Nails Thick disfigured discolored nails with subungual debris bilaterally from hallux to fifth toes bilaterally. No evidence of bacterial infection or drainage bilaterally.  Orthopedic  No limitations of motion of motion feet bilaterally.  No crepitus or effusions noted.  No bony pathology or digital deformities noted.  Skin  normotropic skin with no porokeratosis noted bilaterally.  No signs of infections or ulcers noted.          Assessment & Plan:  Onychomycosis  B/L  Diabetes with no foot complications.   IE  Debride nails x 1-10.  RTC 3 months   Gardiner Barefoot DPM

## 2017-12-16 DIAGNOSIS — F411 Generalized anxiety disorder: Secondary | ICD-10-CM | POA: Diagnosis not present

## 2017-12-16 DIAGNOSIS — F322 Major depressive disorder, single episode, severe without psychotic features: Secondary | ICD-10-CM | POA: Diagnosis not present

## 2017-12-22 ENCOUNTER — Observation Stay
Admission: EM | Admit: 2017-12-22 | Discharge: 2017-12-24 | Disposition: A | Discharge status: Home / Self Care | Payer: Medicare Other | Attending: Internal Medicine | Admitting: Internal Medicine

## 2017-12-22 ENCOUNTER — Emergency Department: Payer: Medicare Other

## 2017-12-22 ENCOUNTER — Other Ambulatory Visit: Payer: Self-pay

## 2017-12-22 ENCOUNTER — Observation Stay
Admit: 2017-12-22 | Discharge: 2017-12-22 | Disposition: A | Discharge status: Home / Self Care | Payer: Medicare Other | Attending: Internal Medicine | Admitting: Internal Medicine

## 2017-12-22 DIAGNOSIS — I509 Heart failure, unspecified: Secondary | ICD-10-CM | POA: Diagnosis not present

## 2017-12-22 DIAGNOSIS — Z7982 Long term (current) use of aspirin: Secondary | ICD-10-CM | POA: Insufficient documentation

## 2017-12-22 DIAGNOSIS — R2681 Unsteadiness on feet: Secondary | ICD-10-CM | POA: Insufficient documentation

## 2017-12-22 DIAGNOSIS — I700 Atherosclerosis of aorta: Secondary | ICD-10-CM | POA: Diagnosis not present

## 2017-12-22 DIAGNOSIS — I252 Old myocardial infarction: Secondary | ICD-10-CM | POA: Diagnosis not present

## 2017-12-22 DIAGNOSIS — E782 Mixed hyperlipidemia: Secondary | ICD-10-CM | POA: Diagnosis not present

## 2017-12-22 DIAGNOSIS — N184 Chronic kidney disease, stage 4 (severe): Secondary | ICD-10-CM | POA: Insufficient documentation

## 2017-12-22 DIAGNOSIS — R296 Repeated falls: Secondary | ICD-10-CM | POA: Insufficient documentation

## 2017-12-22 DIAGNOSIS — I130 Hypertensive heart and chronic kidney disease with heart failure and stage 1 through stage 4 chronic kidney disease, or unspecified chronic kidney disease: Secondary | ICD-10-CM | POA: Diagnosis not present

## 2017-12-22 DIAGNOSIS — Z79899 Other long term (current) drug therapy: Secondary | ICD-10-CM | POA: Diagnosis not present

## 2017-12-22 DIAGNOSIS — I2510 Atherosclerotic heart disease of native coronary artery without angina pectoris: Secondary | ICD-10-CM | POA: Insufficient documentation

## 2017-12-22 DIAGNOSIS — I426 Alcoholic cardiomyopathy: Secondary | ICD-10-CM | POA: Insufficient documentation

## 2017-12-22 DIAGNOSIS — R0789 Other chest pain: Principal | ICD-10-CM | POA: Insufficient documentation

## 2017-12-22 DIAGNOSIS — Z8673 Personal history of transient ischemic attack (TIA), and cerebral infarction without residual deficits: Secondary | ICD-10-CM | POA: Diagnosis not present

## 2017-12-22 DIAGNOSIS — R079 Chest pain, unspecified: Secondary | ICD-10-CM | POA: Diagnosis present

## 2017-12-22 DIAGNOSIS — I429 Cardiomyopathy, unspecified: Secondary | ICD-10-CM | POA: Diagnosis not present

## 2017-12-22 DIAGNOSIS — Z794 Long term (current) use of insulin: Secondary | ICD-10-CM | POA: Insufficient documentation

## 2017-12-22 DIAGNOSIS — G309 Alzheimer's disease, unspecified: Secondary | ICD-10-CM | POA: Diagnosis not present

## 2017-12-22 DIAGNOSIS — R748 Abnormal levels of other serum enzymes: Secondary | ICD-10-CM | POA: Diagnosis not present

## 2017-12-22 DIAGNOSIS — F17210 Nicotine dependence, cigarettes, uncomplicated: Secondary | ICD-10-CM | POA: Insufficient documentation

## 2017-12-22 DIAGNOSIS — D631 Anemia in chronic kidney disease: Secondary | ICD-10-CM | POA: Diagnosis not present

## 2017-12-22 DIAGNOSIS — E1122 Type 2 diabetes mellitus with diabetic chronic kidney disease: Secondary | ICD-10-CM | POA: Diagnosis not present

## 2017-12-22 DIAGNOSIS — S0990XA Unspecified injury of head, initial encounter: Secondary | ICD-10-CM | POA: Diagnosis not present

## 2017-12-22 DIAGNOSIS — F0390 Unspecified dementia without behavioral disturbance: Secondary | ICD-10-CM | POA: Diagnosis not present

## 2017-12-22 DIAGNOSIS — W19XXXA Unspecified fall, initial encounter: Secondary | ICD-10-CM

## 2017-12-22 LAB — CBC WITH DIFFERENTIAL/PLATELET
BASOS ABS: 0.1 10*3/uL (ref 0–0.1)
BASOS PCT: 1 %
EOS PCT: 2 %
Eosinophils Absolute: 0.1 10*3/uL (ref 0–0.7)
HCT: 38.9 % — ABNORMAL LOW (ref 40.0–52.0)
Hemoglobin: 12.4 g/dL — ABNORMAL LOW (ref 13.0–18.0)
Lymphocytes Relative: 32 %
Lymphs Abs: 2.3 10*3/uL (ref 1.0–3.6)
MCH: 28.1 pg (ref 26.0–34.0)
MCHC: 31.9 g/dL — AB (ref 32.0–36.0)
MCV: 88 fL (ref 80.0–100.0)
MONOS PCT: 10 %
Monocytes Absolute: 0.7 10*3/uL (ref 0.2–1.0)
Neutro Abs: 4.1 10*3/uL (ref 1.4–6.5)
Neutrophils Relative %: 55 %
PLATELETS: 223 10*3/uL (ref 150–440)
RBC: 4.42 MIL/uL (ref 4.40–5.90)
RDW: 14.1 % (ref 11.5–14.5)
WBC: 7.3 10*3/uL (ref 3.8–10.6)

## 2017-12-22 LAB — BASIC METABOLIC PANEL
ANION GAP: 8 (ref 5–15)
BUN: 35 mg/dL — ABNORMAL HIGH (ref 6–20)
CO2: 23 mmol/L (ref 22–32)
Calcium: 9.2 mg/dL (ref 8.9–10.3)
Chloride: 107 mmol/L (ref 101–111)
Creatinine, Ser: 2.97 mg/dL — ABNORMAL HIGH (ref 0.61–1.24)
GFR, EST AFRICAN AMERICAN: 23 mL/min — AB (ref >60)
GFR, EST NON AFRICAN AMERICAN: 20 mL/min — AB (ref >60)
Glucose, Bld: 124 mg/dL — ABNORMAL HIGH (ref 65–99)
Potassium: 4.5 mmol/L (ref 3.5–5.1)
Sodium: 138 mmol/L (ref 135–145)

## 2017-12-22 LAB — TROPONIN I
TROPONIN I: 0.07 ng/mL — AB (ref <0.03)
TROPONIN I: 0.07 ng/mL — AB (ref <0.03)
TROPONIN I: 0.07 ng/mL — AB (ref <0.03)

## 2017-12-22 LAB — GLUCOSE, CAPILLARY
Glucose-Capillary: 88 mg/dL (ref 65–99)
Glucose-Capillary: 130 mg/dL — ABNORMAL HIGH (ref 65–99)

## 2017-12-22 MED ORDER — INSULIN ASPART 100 UNIT/ML ~~LOC~~ SOLN
0.0000 [IU] | Freq: Three times a day (TID) | SUBCUTANEOUS | Status: DC
  Administered 2017-12-23 – 2017-12-24 (×2): 1 [IU] via SUBCUTANEOUS
  Filled 2017-12-22 (×2): qty 1

## 2017-12-22 MED ORDER — HYDRALAZINE HCL 25 MG PO TABS
25.0000 mg | ORAL_TABLET | Freq: Four times a day (QID) | ORAL | Status: DC
  Administered 2017-12-22 – 2017-12-24 (×8): 25 mg via ORAL
  Filled 2017-12-22 (×8): qty 1

## 2017-12-22 MED ORDER — ISOSORBIDE MONONITRATE ER 30 MG PO TB24
30.0000 mg | ORAL_TABLET | Freq: Every day | ORAL | Status: DC
  Administered 2017-12-23 – 2017-12-24 (×2): 30 mg via ORAL
  Filled 2017-12-22 (×2): qty 1

## 2017-12-22 MED ORDER — ASPIRIN EC 81 MG PO TBEC
81.0000 mg | DELAYED_RELEASE_TABLET | Freq: Every day | ORAL | Status: DC
  Administered 2017-12-23 – 2017-12-24 (×2): 81 mg via ORAL
  Filled 2017-12-22 (×2): qty 1

## 2017-12-22 MED ORDER — SODIUM CHLORIDE 0.9% FLUSH
3.0000 mL | Freq: Two times a day (BID) | INTRAVENOUS | Status: DC
  Administered 2017-12-22 – 2017-12-23 (×3): 3 mL via INTRAVENOUS

## 2017-12-22 MED ORDER — ACETAMINOPHEN 325 MG PO TABS: 650.0000 mg | ORAL_TABLET | Freq: Four times a day (QID) | ORAL | Status: DC | PRN

## 2017-12-22 MED ORDER — ONDANSETRON HCL 4 MG/2ML IJ SOLN: 4.0000 mg | Freq: Four times a day (QID) | INTRAMUSCULAR | Status: DC | PRN

## 2017-12-22 MED ORDER — ENOXAPARIN SODIUM 30 MG/0.3ML ~~LOC~~ SOLN: 30.0000 mg | SUBCUTANEOUS | Status: DC

## 2017-12-22 MED ORDER — HYDRALAZINE HCL 25 MG PO TABS: 25.0000 mg | ORAL_TABLET | Freq: Two times a day (BID) | ORAL | Status: DC | PRN

## 2017-12-22 MED ORDER — MAGNESIUM HYDROXIDE 400 MG/5ML PO SUSP: 15.0000 mL | Freq: Every day | ORAL | Status: DC | PRN

## 2017-12-22 MED ORDER — ATORVASTATIN CALCIUM 10 MG PO TABS
10.0000 mg | ORAL_TABLET | Freq: Every evening | ORAL | Status: DC
  Administered 2017-12-22 – 2017-12-23 (×2): 10 mg via ORAL
  Filled 2017-12-22 (×2): qty 1

## 2017-12-22 MED ORDER — HEPARIN SODIUM (PORCINE) 5000 UNIT/ML IJ SOLN
5000.0000 [IU] | Freq: Three times a day (TID) | INTRAMUSCULAR | Status: DC
  Administered 2017-12-22 – 2017-12-24 (×5): 5000 [IU] via SUBCUTANEOUS
  Filled 2017-12-22 (×6): qty 1

## 2017-12-22 MED ORDER — INSULIN ASPART 100 UNIT/ML ~~LOC~~ SOLN: 0.0000 [IU] | Freq: Every day | SUBCUTANEOUS | Status: DC

## 2017-12-22 MED ORDER — VITAMIN D 1000 UNITS PO TABS
2000.0000 [IU] | ORAL_TABLET | Freq: Every day | ORAL | Status: DC
  Administered 2017-12-23 – 2017-12-24 (×2): 2000 [IU] via ORAL
  Filled 2017-12-22 (×2): qty 2

## 2017-12-22 MED ORDER — CARVEDILOL 12.5 MG PO TABS
12.5000 mg | ORAL_TABLET | Freq: Two times a day (BID) | ORAL | Status: DC
  Administered 2017-12-22 – 2017-12-24 (×4): 12.5 mg via ORAL
  Filled 2017-12-22 (×4): qty 1

## 2017-12-22 MED ORDER — MELATONIN 5 MG PO TABS
10.0000 mg | ORAL_TABLET | Freq: Every evening | ORAL | Status: DC | PRN
  Administered 2017-12-22: 10 mg via ORAL
  Filled 2017-12-22 (×2): qty 2

## 2017-12-22 MED ORDER — LOPERAMIDE HCL 2 MG PO CAPS: 2.0000 mg | ORAL_CAPSULE | ORAL | Status: DC | PRN

## 2017-12-22 MED ORDER — DONEPEZIL HCL 5 MG PO TABS
5.0000 mg | ORAL_TABLET | Freq: Every day | ORAL | Status: DC
  Administered 2017-12-22 – 2017-12-23 (×2): 5 mg via ORAL
  Filled 2017-12-22 (×3): qty 1

## 2017-12-22 MED ORDER — BISMUTH SUBSALICYLATE 262 MG/15ML PO SUSP
15.0000 mL | Freq: Every day | ORAL | Status: DC | PRN
  Filled 2017-12-22: qty 118

## 2017-12-22 NOTE — ED Notes (Signed)
Recalled the daughter of the pt and let her know the new room assignment

## 2017-12-22 NOTE — ED Notes (Signed)
Patient transported to CT 

## 2017-12-22 NOTE — ED Provider Notes (Signed)
[facility] Emergency Department Provider Note  ____________________________________________   First MD Initiated Contact with Patient 12/22/17 1159     (approximate)  I have reviewed the triage vital signs and the nursing notes.   HISTORY  Chief Complaint Chest Pain   HPI Kyle Short is a 72 y.o. male myocardial infarction, history of alcohol abuse, chronic kidney disease as well as Alzheimer's dementia who is presenting to the emergency department today with central chest pain.  He says that the pain started after he fell in the bathroom.  He said that he tripped and fell but does not remember what he hit.  He says that soon after he started having a burning chest pain to the center of his chest.  EMS was called.  The patient was given 324 of aspirin as well as 2 nitro's and is now pain-free.  He denies radiation of the pain.  Denies associated, nausea, vomiting, shortness of breath or diaphoresis.  Does not report any exacerbating factors.   Past Medical History:  Diagnosis Date  . Alcohol abuse   . Alzheimer's dementia with behavioral disturbance   . Anemia of chronic disease   . Cardiomyopathy due to hypertension, without heart failure ([place])   . Chronic kidney disease (CKD) stage G3b/A1, moderately decreased glomerular filtration rate (GFR) between 30-44 mL/min/1.73 square meter and albuminuria creatinine ratio less than 30 mg/g (HCC)   . Hyperlipidemia   . Lives in group home   . Myocardial infarction ([place])   . Stroke [facility])     Patient Active Problem List   Diagnosis Date Noted  . Head trauma 12/13/2017  . History of alcohol abuse 12/13/2017  . Hypertension 12/13/2017  . Lives in group home 02/10/2017  . Secondary cardiomyopathy ([place]) 10/14/2015  . History of MI (myocardial infarction) 10/14/2015  . History of orthostatic hypotension 09/11/2015  . Syncope 09/11/2015  . Chronic kidney disease, stage 4, severely decreased GFR (HCC)  07/26/2015  . Benign essential HTN 07/26/2015  . Type 2 diabetes mellitus with chronic kidney disease ([place]) 07/25/2015  . Alcohol dependence in remission ([place]) 07/25/2015  . HLD (hyperlipidemia) 07/25/2015  . Cerebrovascular accident, old 07/25/2015  . Alzheimer's dementia with behavioral disturbance 06/07/2015  . Current tobacco use 02/26/2015  . Bilateral leg weakness 02/26/2015    Past Surgical History:  Procedure Laterality Date  . TONSILLECTOMY      Prior to Admission medications   Medication Sig Start Date End Date Taking? Authorizing Provider  aspirin (ASPIRIN LOW DOSE) 81 MG chewable tablet Chew 1 tablet (81 mg total) by mouth daily. 02/10/17   Karamalegos, Devonne Doughty, DO  atorvastatin (LIPITOR) 10 MG tablet Take 1 tablet (10 mg total) by mouth daily. 02/10/17   Karamalegos, Devonne Doughty, DO  bismuth subsalicylate (PEPTO BISMOL) 262 MG chewable tablet Chew 262 mg by mouth daily as needed for indigestion or diarrhea or loose stools.    [provider]  Blood Glucose Monitoring Suppl (ACCU-CHEK NANO SMARTVIEW) w/Device KIT Use only for as needed glucose checks if symptomatic. 02/24/17   Karamalegos, Devonne Doughty, DO  carvedilol (COREG) 12.5 MG tablet Take 1 tablet (12.5 mg total) by mouth 2 (two) times daily with a meal. 02/10/17   Karamalegos, Devonne Doughty, DO  Cholecalciferol (HM VITAMIN D3) 2000 units CAPS Take 1 capsule (2,000 Units total) by mouth daily. 02/10/17   Karamalegos, Devonne Doughty, DO  donepezil (ARICEPT) 5 MG tablet Take 1 tablet (5 mg total) by mouth at bedtime. 11/25/17  Karamalegos, Devonne Doughty, DO  glucose blood (ACCU-CHEK SMARTVIEW) test strip Check once as needed only if symptoms of hypoglycemia (dizziness, tremors, shaking) 02/24/17   Karamalegos, Devonne Doughty, DO  hydrALAZINE (APRESOLINE) 25 MG tablet Take 1 tablet (25 mg total) by mouth 4 (four) times daily. 02/10/17   Karamalegos, Devonne Doughty, DO  hydrALAZINE (APRESOLINE) 25 MG tablet Take 1 tablet (25 mg  total) by mouth 2 (two) times daily as needed (Elevated BP >160/100). 10/06/17   Karamalegos, Devonne Doughty, DO  isosorbide mononitrate (IMDUR) 30 MG 24 hr tablet Take 1 tablet (30 mg total) by mouth daily. 02/10/17   Karamalegos, Devonne Doughty, DO  Melatonin 10 MG CAPS Take 10 mg by mouth at bedtime as needed. 02/10/17   Olin Hauser, DO    Allergies Patient has no known allergies.  Family History  Problem Relation Age of Onset  . Hypertension Other   . Hypertension Mother   . Diabetes Father     Social History Social History   Tobacco Use  . Smoking status: Current Every Day Smoker    Packs/day: 1.00    Types: Cigarettes  . Smokeless tobacco: Current User  Substance Use Topics  . Alcohol use: Yes    Alcohol/week: 6.0 oz    Types: 10 Cans of beer per week  . Drug use: No    Review of Systems  Constitutional: No fever/chills Eyes: No visual changes. ENT: No sore throat. Cardiovascular: As above Respiratory: Denies shortness of breath. Gastrointestinal: No abdominal pain.  No nausea, no vomiting.  No diarrhea.  No constipation. Genitourinary: Negative for dysuria. Musculoskeletal: Negative for back pain. Skin: Negative for rash. Neurological: Negative for headaches, focal weakness or numbness.   ____________________________________________   PHYSICAL EXAM:  VITAL SIGNS: ED Triage Vitals  Enc Vitals Group     BP 12/22/17 1203 (!) 142/97     Pulse --      Resp 12/22/17 1203 20     Temp 12/22/17 1203 98 F (36.7 C)     Temp Source 12/22/17 1203 Oral     SpO2 12/22/17 1203 100 %     Weight 12/22/17 1206 148 lb (67.1 kg)     Height 12/22/17 1206 6' 1" (1.854 m)     Head Circumference --      Peak Flow --      Pain Score --      Pain Loc --      Pain Edu? --      Excl. in [place]? --     Constitutional: Alert and oriented. Well appearing and in no acute distress. Eyes: Conjunctivae are normal.  Head: Atraumatic. Nose: No  congestion/rhinnorhea. Mouth/Throat: Mucous membranes are moist.  Neck: No stridor.   Cardiovascular: Normal rate, regular rhythm. Grossly normal heart sounds.  Anterior chest over the sternum with the patient is reporting the pain is not tender/reproducible to palpation. Respiratory: Normal respiratory effort.  No retractions. Lungs CTAB. Gastrointestinal: Soft and nontender. No distention.  Musculoskeletal: No lower extremity tenderness nor edema.  No joint effusions. Neurologic:  Normal speech and language. No gross focal neurologic deficits are appreciated. Skin:  Skin is warm, dry and intact. No rash noted. Psychiatric: Mood and affect are normal. Speech and behavior are normal.  ____________________________________________   LABS (all labs ordered are listed, but only abnormal results are displayed)  Labs Reviewed  CBC WITH DIFFERENTIAL/PLATELET  BASIC METABOLIC PANEL  TROPONIN I   ____________________________________________  EKG  ED ECG REPORT I, Youlanda Roys  Schaevitz, the attending physician, personally viewed and interpreted this ECG.   Date: 12/22/2017  EKG Time: 1204  Rate: 68  Rhythm: normal sinus rhythm.  EKG machine read as atrial fibrillation.  However, this is likely due to baseline static.  There are P waves visualized in V4 through V6 as well as the rhythm strip V2.  Axis: Normal  Intervals: Normal  ST&T Change: Biphasic T waves in V5 and V6.  No significant change from EKG of August 06, 2017  ____________________________________________  RADIOLOGY  No acute finding on the chest x-ray.  No acute finding on the CT head. ____________________________________________   PROCEDURES  Procedure(s) performed:   Procedures  Critical Care performed:   ____________________________________________   INITIAL IMPRESSION / ASSESSMENT AND PLAN / ED COURSE  Pertinent labs & imaging results that were available during my care of the patient were reviewed by me and  considered in my medical decision making (see chart for details).  Differential diagnosis includes, but is not limited to, ACS, aortic dissection, pulmonary embolism, cardiac tamponade, pneumothorax, pneumonia, pericarditis, myocarditis, GI-related causes including esophagitis/gastritis, and musculoskeletal chest wall pain.   As part of my medical decision making, I reviewed the following data within the electronic MEDICAL RECORD NUMBER Notes from prior ED visits  ----------------------------------------- 1:28 PM on 12/22/2017 -----------------------------------------  Patient continues to be pain-free but has a concerning story given that he had chest pain that was relieved with nitroglycerin.  Because of the patient's risk factors as well as the scarcity of accurate history because of his dementia he will be observed overnight.  He is understanding of this plan and willing to comply.  Signed out to Dr. Anselm Jungling.      ____________________________________________   FINAL CLINICAL IMPRESSION(S) / ED DIAGNOSES  Fall.  Chest pain.    NEW MEDICATIONS STARTED DURING THIS VISIT:  This SmartLink is deprecated. Use AVSMEDLIST instead to display the medication list for a patient.   Note:  This document was prepared using Dragon voice recognition software and may include unintentional dictation errors.     Orbie Pyo, MD 12/22/17 1329

## 2017-12-22 NOTE — ED Notes (Addendum)
Spoke with daughter of pt Jeshurun [place] who is also pt's legal guardian and updated her on pt's care. Will notified daughter once more information becomes available.  Phone [phone]

## 2017-12-22 NOTE — ED Notes (Signed)
This RN attempted to call legal guardian. RN received Voicemail, RN will notify attending RN to retry call.

## 2017-12-22 NOTE — ED Notes (Signed)
Assisted pt up to toilet, helped pt clean up and changed linens on bed.

## 2017-12-22 NOTE — ED Triage Notes (Addendum)
Pt arrives via [facility] from [place]  home. Pt has  Chest ain that started today at 1000 am. Pt had an unwitnessed fall in the bathroom. Pt denies hitting his head or LOC. Pt states he didn't hit anything and reports no pain at this time. Pt received 2 nitro tablets, and 324 mg Asprin in route to [facility]. Pt presents with a 20 g in the left AC. PT is A/o EDP at bedside.

## 2017-12-22 NOTE — ED Notes (Signed)
Dr Clearnce Hasten notified in person of pts troponin 0.07

## 2017-12-22 NOTE — ED Notes (Signed)
Date and time results received: 12/22/17 1327 (use smartphrase ".now" to insert current time)  Test: 0.07 Critical Value: troponin  Name of Provider Notified: MD Schaevitz  Orders Received? Or Actions Taken?: Orders Received - See Orders for details

## 2017-12-22 NOTE — ED Notes (Addendum)
Called and left message for Kyle Short, pt's daughter and legal guardian that he would be admitted to hospital and would be going to room 232 in the next 30 minutes or so.

## 2017-12-22 NOTE — Progress Notes (Signed)
   12/22/17 1600  Clinical Encounter Type  Visited With Patient not available;Health care provider  Visit Type Initial;Other (Comment) (HCPOA Request)  Referral From Nurse  Spiritual Encounters  Spiritual Needs Other (Comment)  [place] spoke with RN regarding patient; patient not able to complete HCPOA; legal guardian is out of state and it was their request; [place] unable to facilitate due to patient status.

## 2017-12-22 NOTE — ED Notes (Addendum)
Called and left message for Kyle Short from pt's residence ([place] home), Phone #[phone] in regards to pt care update. Left message stating that pt would be admitted to hospital for further evaluations.

## 2017-12-22 NOTE — Plan of Care (Signed)
  Progressing Education: Knowledge of General Education information will improve 12/22/2017 2308 - Progressing by Loran Senters, RN Safety: Ability to remain free from injury will improve 12/22/2017 2308 - Progressing by Loran Senters, RN Cardiac: Ability to achieve and maintain adequate cardiovascular perfusion will improve 12/22/2017 2308 - Progressing by Loran Senters, RN

## 2017-12-22 NOTE — ED Notes (Addendum)
Spoke with Madelynn Done from [facility] and updated him on pt's care and that he would be admitted to hospital and that I would let daughter know as well.

## 2017-12-22 NOTE — H&P (Addendum)
[place] [facility] [place] NAME: Kyle Short    MR#:  [id number]  DATE OF BIRTH:  Dec 26, 1945  DATE OF ADMISSION:  12/22/2017  PRIMARY CARE PHYSICIAN: Olin Hauser, DO   REQUESTING/REFERRING PHYSICIAN: Orbie Pyo, MD  CHIEF COMPLAINT:   Chest pain HISTORY OF PRESENT ILLNESS:  Kyle Short  is a 72 y.o. male with a known history of alcohol abuse, Alzheimer's dementia, anemia of chronic kidney disease, history of MI is brought into the ED from the group home with central chest pain.  Patient states that the pain started after he fell in the bathroom which was a mechanical fall.  Patient was given aspirin 324 mg and 2 sublingual nitroglycerin following which patient was pain-free.  Denies any nausea or vomiting.  Resting comfortably during my examination.  Patient is somewhat poor historian chronic history of dementia, discussed with patient's caregiver from the group home Mr. Madelynn Done  PAST MEDICAL HISTORY:   Past Medical History:  Diagnosis Date  . Alcohol abuse   . Alzheimer's dementia with behavioral disturbance   . Anemia of chronic disease   . Cardiomyopathy due to hypertension, without heart failure ([place])   . Chronic kidney disease (CKD) stage G3b/A1, moderately decreased glomerular filtration rate (GFR) between 30-44 mL/min/1.73 square meter and albuminuria creatinine ratio less than 30 mg/g (HCC)   . Hyperlipidemia   . Lives in group home   . Myocardial infarction ([place])   . Stroke [facility])     PAST SURGICAL HISTOIRY:   Past Surgical History:  Procedure Laterality Date  . TONSILLECTOMY      SOCIAL HISTORY:   Social History   Tobacco Use  . Smoking status: Current Every Day Smoker    Packs/day: 1.00    Types: Cigarettes  . Smokeless tobacco: Current User  Substance Use Topics  . Alcohol use: Yes    Alcohol/week: 6.0 oz    Types: 10 Cans of beer per week    FAMILY HISTORY:   Family History   Problem Relation Age of Onset  . Hypertension Other   . Hypertension Mother   . Diabetes Father     DRUG ALLERGIES:  No Known Allergies  REVIEW OF SYSTEMS:  Limited review of systems given the patient's chronic history of dementia CONSTITUTIONAL: No fever, fatigue or weakness.  RESPIRATORY: No cough, shortness of breath, wheezing or hemoptysis.  CARDIOVASCULAR: No chest pain, orthopnea, edema.  GASTROINTESTINAL: No nausea, vomiting, diarrhea or abdominal pain.  MUSCULOSKELETAL: No joint pain or arthritis.   NEUROLOGIC: No tingling, numbness, weakness.  PSYCHIATRY: No anxiety or depression.   MEDICATIONS AT HOME:   Prior to Admission medications   Medication Sig Start Date End Date Taking? Authorizing Provider  acetaminophen (TYLENOL) 325 MG tablet Take 650 mg by mouth every 6 (six) hours as needed.   Yes [provider]  aspirin (ASPIRIN LOW DOSE) 81 MG chewable tablet Chew 1 tablet (81 mg total) by mouth daily. 02/10/17  Yes Karamalegos, Devonne Doughty, DO  atorvastatin (LIPITOR) 10 MG tablet Take 1 tablet (10 mg total) by mouth daily. 02/10/17  Yes Karamalegos, Devonne Doughty, DO  carvedilol (COREG) 12.5 MG tablet Take 1 tablet (12.5 mg total) by mouth 2 (two) times daily with a meal. 02/10/17  Yes Karamalegos, Devonne Doughty, DO  Cholecalciferol (HM VITAMIN D3) 2000 units CAPS Take 1 capsule (2,000 Units total) by mouth daily. 02/10/17  Yes Karamalegos, Devonne Doughty, DO  donepezil (ARICEPT) 5 MG tablet Take  1 tablet (5 mg total) by mouth at bedtime. 11/25/17  Yes Karamalegos, Devonne Doughty, DO  hydrALAZINE (APRESOLINE) 25 MG tablet Take 1 tablet (25 mg total) by mouth 4 (four) times daily. Patient taking differently: Take 25 mg by mouth 3 (three) times daily.  02/10/17  Yes Karamalegos, Devonne Doughty, DO  isosorbide mononitrate (IMDUR) 30 MG 24 hr tablet Take 1 tablet (30 mg total) by mouth daily. 02/10/17  Yes Karamalegos, Devonne Doughty, DO  loperamide (IMODIUM) 2 MG capsule Take 2 mg by  mouth as needed for diarrhea or loose stools.   Yes [provider]  magnesium hydroxide (MILK OF MAGNESIA) 400 MG/5ML suspension Take 15 mLs by mouth daily as needed for mild constipation.   Yes [provider]  Melatonin 10 MG CAPS Take 10 mg by mouth at bedtime as needed. 02/10/17  Yes Karamalegos, Devonne Doughty, DO  bismuth subsalicylate (PEPTO BISMOL) 262 MG chewable tablet Chew 262 mg by mouth daily as needed for indigestion or diarrhea or loose stools.    [provider]  Blood Glucose Monitoring Suppl (ACCU-CHEK NANO SMARTVIEW) w/Device KIT Use only for as needed glucose checks if symptomatic. 02/24/17   Karamalegos, Devonne Doughty, DO  glucose blood (ACCU-CHEK SMARTVIEW) test strip Check once as needed only if symptoms of hypoglycemia (dizziness, tremors, shaking) 02/24/17   Karamalegos, Devonne Doughty, DO  hydrALAZINE (APRESOLINE) 25 MG tablet Take 1 tablet (25 mg total) by mouth 2 (two) times daily as needed (Elevated BP >160/100). Patient not taking: Reported on 12/22/2017 10/06/17   Olin Hauser, DO      VITAL SIGNS:  Blood pressure (!) 158/97, pulse (!) 59, temperature 97.9 F (36.6 C), temperature source Oral, resp. rate 16, height _0  (1.854 m), weight 67.1 kg (148 lb), SpO2 100 %.  PHYSICAL EXAMINATION:  GENERAL:  72 y.o.-year-old patient lying in the bed with no acute distress.  EYES: Pupils equal, round, reactive to light and accommodation. No scleral icterus. Extraocular muscles intact.  HEENT: Head atraumatic, normocephalic. Oropharynx and nasopharynx clear.  NECK:  Supple, no jugular venous distention. No thyroid enlargement, no tenderness.  LUNGS: Normal breath sounds bilaterally, no wheezing, rales,rhonchi or crepitation. No use of accessory muscles of respiration.  CARDIOVASCULAR: S1, S2 normal. No murmurs, rubs, or gallops.  ABDOMEN: Soft, nontender, nondistended. Bowel sounds present. No organomegaly or mass.  EXTREMITIES: No pedal edema,  cyanosis, or clubbing.  NEUROLOGIC: Cranial nerves II through XII are intact. Muscle strength 5/5 in all extremities. Sensation intact. Gait not checked.  PSYCHIATRIC: The patient is alert and oriented x 3.  SKIN: No obvious rash, lesion, or ulcer.   LABORATORY PANEL:   CBC Recent Labs  Lab 12/22/17 1209  WBC 7.3  HGB 12.4*  HCT 38.9*  PLT 223   ------------------------------------------------------------------------------------------------------------------  Chemistries  Recent Labs  Lab 12/22/17 1244  NA 138  K 4.5  CL 107  CO2 23  GLUCOSE 124*  BUN 35*  CREATININE 2.97*  CALCIUM 9.2   ------------------------------------------------------------------------------------------------------------------  Cardiac Enzymes Recent Labs  Lab 12/22/17 1244  TROPONINI 0.07*   ------------------------------------------------------------------------------------------------------------------  RADIOLOGY:  Dg Chest 2 View  Result Date: 12/22/2017 CLINICAL DATA:  Onset of chest pain at 10 a.m. today. Also unwitnessed fall in the bathroom. History of CHF, previous MI, previous CVA, dementia. EXAM: CHEST  2 VIEW COMPARISON:  Chest x-ray of August 06, 2017 FINDINGS: The lungs are adequately inflated. There is no focal infiltrate. There is no pleural effusion. There calcification in the wall of  the aortic arch. The heart and pulmonary vascularity are normal. The bony thorax exhibits no acute abnormality. IMPRESSION: There is no active cardiopulmonary disease. Thoracic aortic atherosclerosis. Electronically Signed   By: David  Martinique M.D.   On: 12/22/2017 12:42   Ct Head Wo Contrast  Result Date: 12/22/2017 CLINICAL DATA:  Unwitnessed fall.  Patient denies hitting head. EXAM: CT HEAD WITHOUT CONTRAST TECHNIQUE: Contiguous axial images were obtained from the base of the skull through the vertex without intravenous contrast. COMPARISON:  CT head dated July 06, 2015. FINDINGS: Brain: No  evidence of acute infarction, hemorrhage, hydrocephalus, extra-axial collection or mass lesion/mass effect. Stable mild cerebral atrophy and extensive chronic microvascular ischemic white matter disease. Bilateral basal ganglia and left cerebellar lacunar infarcts are unchanged. Vascular: Atherosclerotic calcifications of the carotid siphons. No hyperdense vessel. Skull: Normal. Negative for fracture or focal lesion. Sinuses/Orbits: No acute finding. Other: None. IMPRESSION: 1.  No acute intracranial abnormality. 2. 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 Electronically Signed   By: Titus Dubin M.D.   On: 12/22/2017 13:10    EKG:   Orders placed or performed during the hospital encounter of 08/06/17  . EKG 12-Lead  . EKG 12-Lead  . ED EKG  . ED EKG  . EKG    IMPRESSION AND PLAN:  Kyle Short  is a 72 y.o. male with a known history of alcohol abuse, Alzheimer's dementia, anemia of chronic kidney disease, history of MI is brought into the ED from the group home with central chest pain.  Patient states that the pain started after he fell in the bathroom which was a mechanical fall.  Patient was given aspirin 324 mg and 2 sublingual nitroglycerin following which patient was pain-free.   #Chest pain with elevated troponin Admit to telemetry Cycle cardiac biomarkers Consult cardiology-[facility]  will get echocardiogram Nitroglycerin as needed Continue aspirin, statin, Coreg,, Imdur   #Chronic history of dementia And any home medication Aricept  #Diabetes mellitus type 2 With diabetic diet Sliding scale insulin and check hemoglobin A1c  #History of alcoholic cardiomyopathy Continue aspirin, statin, Coreg,, Imdur  #Essential hypertension Continue Coreg and hydralazine and titrate as needed   DVT prophylaxis with heparin subcu  All the records are reviewed and case discussed with ED provider. Management plans discussed with the  patient's care given mr.Clement and VM left to daughter Davy Pique to call back for the update   CODE STATUS: fc  TOTAL TIME TAKING CARE OF THIS PATIENT: 43  minutes.   Note: This dictation was prepared with Dragon dictation along with smaller phrase technology. Any transcriptional errors that result from this process are unintentional.  Nicholes Mango M.D on 12/22/2017 at 4:41 PM  Between 7am to 6pm - Pager - [phone]  After 6pm go to [email or web address] - password [email or web address]  Office  [phone]  CC: Primary care physician; Olin Hauser, DO

## 2017-12-23 DIAGNOSIS — F411 Generalized anxiety disorder: Secondary | ICD-10-CM | POA: Diagnosis not present

## 2017-12-23 DIAGNOSIS — R55 Syncope and collapse: Secondary | ICD-10-CM | POA: Diagnosis not present

## 2017-12-23 DIAGNOSIS — F0390 Unspecified dementia without behavioral disturbance: Secondary | ICD-10-CM | POA: Diagnosis not present

## 2017-12-23 DIAGNOSIS — I429 Cardiomyopathy, unspecified: Secondary | ICD-10-CM | POA: Diagnosis not present

## 2017-12-23 DIAGNOSIS — R748 Abnormal levels of other serum enzymes: Secondary | ICD-10-CM | POA: Diagnosis not present

## 2017-12-23 DIAGNOSIS — N184 Chronic kidney disease, stage 4 (severe): Secondary | ICD-10-CM | POA: Diagnosis not present

## 2017-12-23 DIAGNOSIS — R0789 Other chest pain: Secondary | ICD-10-CM | POA: Diagnosis not present

## 2017-12-23 DIAGNOSIS — F322 Major depressive disorder, single episode, severe without psychotic features: Secondary | ICD-10-CM | POA: Diagnosis not present

## 2017-12-23 LAB — GLUCOSE, CAPILLARY
GLUCOSE-CAPILLARY: 97 mg/dL (ref 65–99)
GLUCOSE-CAPILLARY: 96 mg/dL (ref 65–99)
Glucose-Capillary: 131 mg/dL — ABNORMAL HIGH (ref 65–99)
Glucose-Capillary: 86 mg/dL (ref 65–99)

## 2017-12-23 LAB — ECHOCARDIOGRAM COMPLETE
Height: 73 in
WEIGHTICAEL: 2368 [oz_av]

## 2017-12-23 LAB — TROPONIN I: Troponin I: 0.07 ng/mL (ref <0.03)

## 2017-12-23 LAB — MRSA PCR SCREENING: MRSA BY PCR: NEGATIVE

## 2017-12-23 LAB — HEMOGLOBIN A1C
Hgb A1c MFr Bld: 6.3 % — ABNORMAL HIGH (ref 4.8–5.6)
Mean Plasma Glucose: 134.11 mg/dL

## 2017-12-23 NOTE — Progress Notes (Addendum)
[facility] NAME: Kyle Short    MR#:  [id number]  DATE OF BIRTH:  [birth year]  SUBJECTIVE:  Admitted For chest pain, fall.  Patient denies any chest pain now.  Has been falling lately at home.  He is comfortable, denies any pain.  Seen by cardiology.  CHIEF COMPLAINT:   Chief Complaint  Patient presents with  . Chest Pain    REVIEW OF SYSTEMS:   ROS CONSTITUTIONAL: No fever, fatigue or weakness.  EYES: No blurred or double vision.  EARS, NOSE, AND THROAT: No tinnitus or ear pain.  RESPIRATORY: No cough, shortness of breath, wheezing or hemoptysis.  CARDIOVASCULAR: No chest pain, orthopnea, edema.  GASTROINTESTINAL: No nausea, vomiting, diarrhea or abdominal pain.  GENITOURINARY: No dysuria, hematuria.  ENDOCRINE: No polyuria, nocturia,  HEMATOLOGY: No anemia, easy bruising or bleeding SKIN: No rash or lesion. MUSCULOSKELETAL: No joint pain or arthritis.   NEUROLOGIC: No tingling, numbness, weakness.  PSYCHIATRY: No anxiety or depression.   DRUG ALLERGIES:  No Known Allergies  VITALS:  Blood pressure (!) 141/81, pulse (!) 57, temperature 98.1 F (36.7 C), temperature source Oral, resp. rate 18, height 6\' 1"  (1.854 m), weight 67.1 kg (148 lb), SpO2 98 %.  PHYSICAL EXAMINATION:  GENERAL:  72 y.o.-year-old patient lying in the bed with no acute distress.  Appears cachectic.Marland Kitchen EYES: Pupils equal, round, reactive to light and accommodation. No scleral icterus. Extraocular muscles intact.  HEENT: Head atraumatic, normocephalic. Oropharynx and nasopharynx clear.  NECK:  Supple, no jugular venous distention. No thyroid enlargement, no tenderness.  LUNGS: Normal breath sounds bilaterally, no wheezing, rales,rhonchi or crepitation. No use of accessory muscles of respiration.  CARDIOVASCULAR: S1, S2 normal. No murmurs, rubs, or gallops.  ABDOMEN: Soft, nontender, nondistended. Bowel sounds present. No organomegaly or mass.   EXTREMITIES: No pedal edema, cyanosis, or clubbing.  NEUROLOGIC: Cranial nerves II through XII are intact. Muscle strength 5/5 in all extremities. Sensation intact. Gait not checked.  PSYCHIATRIC: The patient is alert and oriented x 3.  SKIN: No obvious rash, lesion, or ulcer.    LABORATORY PANEL:   CBC Recent Labs  Lab 12/22/17 1209  WBC 7.3  HGB 12.4*  HCT 38.9*  PLT 223   ------------------------------------------------------------------------------------------------------------------  Chemistries  Recent Labs  Lab 12/22/17 1244  NA 138  K 4.5  CL 107  CO2 23  GLUCOSE 124*  BUN 35*  CREATININE 2.97*  CALCIUM 9.2   ------------------------------------------------------------------------------------------------------------------  Cardiac Enzymes Recent Labs  Lab 12/23/17 0206  TROPONINI 0.07*   ------------------------------------------------------------------------------------------------------------------  RADIOLOGY:  Dg Chest 2 View  Result Date: 12/22/2017 CLINICAL DATA:  Onset of chest pain at 10 a.m. today. Also unwitnessed fall in the bathroom. History of CHF, previous MI, previous CVA, dementia. EXAM: CHEST  2 VIEW COMPARISON:  Chest x-ray of August 06, 2017 FINDINGS: The lungs are adequately inflated. There is no focal infiltrate. There is no pleural effusion. There calcification in the wall of the aortic arch. The heart and pulmonary vascularity are normal. The bony thorax exhibits no acute abnormality. IMPRESSION: There is no active cardiopulmonary disease. Thoracic aortic atherosclerosis. Electronically Signed   By: David  Martinique M.D.   On: 12/22/2017 12:42   Ct Head Wo Contrast  Result Date: 12/22/2017 CLINICAL DATA:  Unwitnessed fall.  Patient denies hitting head. EXAM: CT HEAD WITHOUT CONTRAST TECHNIQUE: Contiguous axial images were obtained from the base of the skull through the vertex without intravenous contrast. COMPARISON:  CT head  dated July 06, 2015. FINDINGS: Brain: No evidence of acute infarction, hemorrhage, hydrocephalus, extra-axial collection or mass lesion/mass effect. Stable mild cerebral atrophy and extensive chronic microvascular ischemic white matter disease. Bilateral basal ganglia and left cerebellar lacunar infarcts are unchanged. Vascular: Atherosclerotic calcifications of the carotid siphons. No hyperdense vessel. Skull: Normal. Negative for fracture or focal lesion. Sinuses/Orbits: No acute finding. Other: None. IMPRESSION: 1.  No acute intracranial abnormality. 2. 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 Electronically Signed   By: Titus Dubin M.D.   On: 12/22/2017 13:10    EKG:   Orders placed or performed during the hospital encounter of 08/06/17  . EKG 12-Lead  . EKG 12-Lead  . ED EKG  . ED EKG  . EKG    ASSESSMENT AND PLAN:  #1 /chest pain with slightly elevated troponin of 0.07 likely demand ischemia from fall.  Seen by cardiology, no further cardiac intervention recommended. he is on aspirin, statins, Coreg, Imdur.  Follow echocardiogram results. #2 chronic dementia: Continue Aricept, he is very poor historian although he could tell me if he had a fall at home and thoughts that he came.  Denies any chest pain now. 3.  Recurrent falls: Get physical therapy evaluation 4.  Alcoholic cardiomyopathy, essential hypertension: Continue Coreg, hydralazine. 5.  Diabetes mellitus type 2:Controlled, continue sliding scale insulin with coverage. #6. CKD stage III: Stable.  All the records are reviewed and case discussed with Care Management/Social Workerr. Management plans discussed with the patient, family and they are in agreement.  CODE STATUS: Full code  TOTAL TIME TAKING CARE OF THIS PATIENT:35 minutes.   POSSIBLE D/C IN1-2 DAYS, DEPENDING ON CLINICAL CONDITION.   Epifanio Lesches M.D on 12/23/2017 at 12:12 PM  Between 7am to 6pm - Pager -  [phone]  After 6pm go to [email or web address] - password [email or web address] [facility]  Office  [phone]  CC: Primary care physician; Olin Hauser, DO   Note: This dictation was prepared with Dragon dictation along with smaller phrase technology. Any transcriptional errors that result from this process are unintentional.

## 2017-12-23 NOTE — Care Management Obs Status (Addendum)
[facility] NOTIFICATION   Patient Details  Name: Kyle Short MRN: [id number] Date of Birth: 1946-01-14   Medicare Observation Status Notification Given:  Yes Left voicemail message for Demetrice Combes- guardian to explain notice.  Patient with alzheimer's and not able to understand or sign notice  1.11.2019- left another voicemail message for guardian to explain obs notice. Call was not returned 1.10.2019  Katrina Stack, RN 12/23/2017, 11:44 AM

## 2017-12-23 NOTE — Care Management Note (Signed)
Case Management Note  Patient Details  Name: Kyle Short MRN: [id number] Date of Birth: 19-Apr-1946  Subjective/Objective:                 Placed in observation from family care home with chest pain and unwitnessed fall..  Patient with alzheimers.  Cardiology has consulted and mild elevation in troponin thought to be related to his chronic kidney disease   Action/Plan:   Expected Discharge Date:                  Expected Discharge Plan:     In-House Referral:     Discharge planning Services     Post Acute Care Choice:    Choice offered to:     DME Arranged:    DME Agency:     HH Arranged:    HH Agency:     Status of Service:     If discussed at [employer], dates discussed:    Additional Comments:  Katrina Stack, RN 12/23/2017, 8:49 AM

## 2017-12-23 NOTE — Evaluation (Signed)
Physical Therapy Evaluation Patient Details Name: Kyle Short MRN: [id number] DOB: 09-May-1946 Today's Date: 12/23/2017   History of Present Illness  Pt admitted for chest pain and complains of fall. History includes alzhemimers' disease, CKD, HTN, CVA, and alcohol abuse  Clinical Impression  Pt is a pleasant 72 year old male who was admitted for chest pain and falls. Pt performs bed mobility with mod I, transfers with cga, and ambulation with cga and RW. Pt educated to continue use of RW at all time for safety and falls risk.  Pt demonstrates deficits with strength/balance. Would benefit from skilled PT to address above deficits and promote optimal return to PLOF. Recommend transition to [place] upon discharge from acute hospitalization.       Follow Up Recommendations Home health PT    Equipment Recommendations  None recommended by PT    Recommendations for Other Services       Precautions / Restrictions Precautions Precautions: Fall Restrictions Weight Bearing Restrictions: No      Mobility  Bed Mobility Overal bed mobility: Modified Independent             General bed mobility comments: safe technique performed with ability to sit at EOB  Transfers Overall transfer level: Needs assistance Equipment used: Rolling walker (2 wheeled) Transfers: Sit to/from Stand Sit to Stand: Min guard         General transfer comment: safe technique performed with RW. Upright posture noted  Ambulation/Gait Ambulation/Gait assistance: Min guard Ambulation Distance (Feet): 200 Feet Assistive device: Rolling walker (2 wheeled) Gait Pattern/deviations: Step-through pattern     General Gait Details: ambulated using RW and reciprocal gait pattern. Cues for keeping close to RW for stability. Slight fatigue with increased gait distance  Stairs            Wheelchair Mobility    Modified Rankin (Stroke Patients Only)       Balance Overall balance assessment: Needs  assistance Sitting-balance support: Feet supported Sitting balance-Leahy Scale: Good     Standing balance support: Bilateral upper extremity supported Standing balance-Leahy Scale: Good                               Pertinent Vitals/Pain Pain Assessment: No/denies pain    Home Living Family/patient expects to be discharged to:: Group home                      Prior Function Level of Independence: Independent with assistive device(s)         Comments: reports he uses RW for mobility, endorses falls     Hand Dominance        Extremity/Trunk Assessment   Upper Extremity Assessment Upper Extremity Assessment: Overall WFL for tasks assessed    Lower Extremity Assessment Lower Extremity Assessment: Generalized weakness(B LE grossly 4/5)       Communication   Communication: No difficulties  Cognition Arousal/Alertness: Awake/alert Behavior During Therapy: WFL for tasks assessed/performed Overall Cognitive Status: Within Functional Limits for tasks assessed                                        General Comments      Exercises Other Exercises Other Exercises: seated ther-ex performed on B LE including LAQ, ankle pumps, and SLRs. ALl ther-ex performed x 10 reps with cga and safe  technique   Assessment/Plan    PT Assessment Patient needs continued PT services  PT Problem List Decreased strength;Decreased balance;Decreased mobility       PT Treatment Interventions Gait training;Therapeutic exercise;Balance training    PT Goals (Current goals can be found in the Care Plan section)  Acute Rehab PT Goals Patient Stated Goal: to get stronger PT Goal Formulation: With patient Time For Goal Achievement: 01/06/18 Potential to Achieve Goals: Good    Frequency Min 2X/week   Barriers to discharge        Co-evaluation               AM-PAC PT "6 Clicks" Daily Activity  Outcome Measure Difficulty turning over in bed  (including adjusting bedclothes, sheets and blankets)?: None Difficulty moving from lying on back to sitting on the side of the bed? : None Difficulty sitting down on and standing up from a chair with arms (e.g., wheelchair, bedside commode, etc,.)?: Unable Help needed moving to and from a bed to chair (including a wheelchair)?: A Little Help needed walking in hospital room?: A Little Help needed climbing 3-5 steps with a railing? : A Little 6 Click Score: 18    End of Session Equipment Utilized During Treatment: Gait belt Activity Tolerance: Patient tolerated treatment well Patient left: in chair;with chair alarm set Nurse Communication: Mobility status PT Visit Diagnosis: Unsteadiness on feet (R26.81);Muscle weakness (generalized) (M62.81);Difficulty in walking, not elsewhere classified (R26.2)    Time: 0454-0981 PT Time Calculation (min) (ACUTE ONLY): 21 min   Charges:   PT Evaluation $PT Eval Low Complexity: 1 Low PT Treatments $Therapeutic Exercise: 8-22 mins   PT G Codes:   PT G-Codes **NOT FOR INPATIENT CLASS** Functional Limitation: Mobility: Walking and moving around    [place], [place], DPT [phone]   Dayrin Stallone 12/23/2017, 5:11 PM

## 2017-12-23 NOTE — Consult Note (Signed)
[facility] Consultation Note  Patient ID: Kyle Short, MRN: [id number], DOB/AGE: 1946/03/17 72 y.o. Admit date: 12/22/2017   Date of Consult: 12/23/2017 Primary Physician: Olin Hauser, DO Primary Cardiologist: Ubaldo Glassing  Chief Complaint:  Chief Complaint  Patient presents with  . Chest Pain   Reason for Consult: Fall  HPI: 72 y.o. male with known apparent cardiovascular disease with previous myocardial infarction chronic kidney disease stage IV anemia and Alzheimer's with apparent previous stroke and hypertension who is been on appropriate medication management over the last many months.  The patient has had multiple falls and near syncope as well as possible syncope with multiple hospitalizations.  The patient has been further evaluated with echocardiogram carotid Doppler and other assessments showing no primary cause of falls or syncope.  Telemetry and Holter monitor has shown occasional preventricular contractions but no evidence of rhythm disturbances or heart block.  The patient remains on appropriate antihypertensive medication management hyperlipidemia treatment and diabetes treatment when he had a apparent mechanical fall after an episode of chest discomfort.  He was given nitroglycerin had full relief of chest discomfort and was seen in the emergency room.  At that time he had a troponin of 0.07 steady and unchanged most consistent with chronic kidney disease issues rather than a syndrome.  Additionally he has an EKG and telemetry showing no evidence of causing his current problems.  If there was some discussion of a link device although this was not placed due to clinically not relevant at this time.  This will be of further consideration although this does not need to be placed at this time  Past Medical History:  Diagnosis Date  . Alcohol abuse   . Alzheimer's dementia with behavioral disturbance   . Anemia of chronic disease   . Cardiomyopathy due to  hypertension, without heart failure ([place])   . Chronic kidney disease (CKD) stage G3b/A1, moderately decreased glomerular filtration rate (GFR) between 30-44 mL/min/1.73 square meter and albuminuria creatinine ratio less than 30 mg/g ([facility])   . Hyperlipidemia   . Lives in group home   . Myocardial infarction ([place])   . Stroke [facility])       Surgical History:  Past Surgical History:  Procedure Laterality Date  . TONSILLECTOMY       Home Meds: Prior to Admission medications   Medication Sig Start Date End Date Taking? Authorizing Provider  acetaminophen (TYLENOL) 325 MG tablet Take 650 mg by mouth every 6 (six) hours as needed.   Yes [provider]  aspirin (ASPIRIN LOW DOSE) 81 MG chewable tablet Chew 1 tablet (81 mg total) by mouth daily. 02/10/17  Yes Karamalegos, Devonne Doughty, DO  atorvastatin (LIPITOR) 10 MG tablet Take 1 tablet (10 mg total) by mouth daily. 02/10/17  Yes Karamalegos, Devonne Doughty, DO  carvedilol (COREG) 12.5 MG tablet Take 1 tablet (12.5 mg total) by mouth 2 (two) times daily with a meal. 02/10/17  Yes Karamalegos, Devonne Doughty, DO  Cholecalciferol (HM VITAMIN D3) 2000 units CAPS Take 1 capsule (2,000 Units total) by mouth daily. 02/10/17  Yes Karamalegos, Alexander J, DO  donepezil (ARICEPT) 5 MG tablet Take 1 tablet (5 mg total) by mouth at bedtime. 11/25/17  Yes Karamalegos, Devonne Doughty, DO  hydrALAZINE (APRESOLINE) 25 MG tablet Take 1 tablet (25 mg total) by mouth 4 (four) times daily. Patient taking differently: Take 25 mg by mouth 3 (three) times daily.  02/10/17  Yes Karamalegos, Devonne Doughty, DO  isosorbide mononitrate (IMDUR) 30  MG 24 hr tablet Take 1 tablet (30 mg total) by mouth daily. 02/10/17  Yes Karamalegos, Devonne Doughty, DO  loperamide (IMODIUM) 2 MG capsule Take 2 mg by mouth as needed for diarrhea or loose stools.   Yes [provider]  magnesium hydroxide (MILK OF MAGNESIA) 400 MG/5ML suspension Take 15 mLs by mouth daily as needed for mild  constipation.   Yes [provider]  Melatonin 10 MG CAPS Take 10 mg by mouth at bedtime as needed. 02/10/17  Yes Karamalegos, Devonne Doughty, DO  bismuth subsalicylate (PEPTO BISMOL) 262 MG chewable tablet Chew 262 mg by mouth daily as needed for indigestion or diarrhea or loose stools.    [provider]  Blood Glucose Monitoring Suppl (ACCU-CHEK NANO SMARTVIEW) w/Device KIT Use only for as needed glucose checks if symptomatic. 02/24/17   Karamalegos, Devonne Doughty, DO  glucose blood (ACCU-CHEK SMARTVIEW) test strip Check once as needed only if symptoms of hypoglycemia (dizziness, tremors, shaking) 02/24/17   Karamalegos, Devonne Doughty, DO  hydrALAZINE (APRESOLINE) 25 MG tablet Take 1 tablet (25 mg total) by mouth 2 (two) times daily as needed (Elevated BP >160/100). Patient not taking: Reported on 12/22/2017 10/06/17   Olin Hauser, DO    Inpatient Medications:  . aspirin EC  81 mg Oral Daily  . atorvastatin  10 mg Oral QPM  . carvedilol  12.5 mg Oral BID WC  . cholecalciferol  2,000 Units Oral Daily  . donepezil  5 mg Oral QHS  . heparin injection (subcutaneous)  5,000 Units Subcutaneous Q8H  . hydrALAZINE  25 mg Oral QID  . insulin aspart  0-5 Units Subcutaneous QHS  . insulin aspart  0-9 Units Subcutaneous TID WC  . isosorbide mononitrate  30 mg Oral Daily  . sodium chloride flush  3 mL Intravenous Q12H     Allergies: No Known Allergies  Social History   Socioeconomic History  . Marital status: Single    Spouse name: Not on file  . Number of children: Not on file  . Years of education: Not on file  . Highest education level: Not on file  Social Needs  . Financial resource strain: Not on file  . Food insecurity - worry: Not on file  . Food insecurity - inability: Not on file  . Transportation needs - medical: Not on file  . Transportation needs - non-medical: Not on file  Occupational History  . Not on file  Tobacco Use  . Smoking status: Current  Every Day Smoker    Packs/day: 1.00    Types: Cigarettes  . Smokeless tobacco: Current User  Substance and Sexual Activity  . Alcohol use: Yes    Alcohol/week: 6.0 oz    Types: 10 Cans of beer per week  . Drug use: No  . Sexual activity: Yes    Birth control/protection: Condom  Other Topics Concern  . Not on file  Social History Narrative  . Not on file     Family History  Problem Relation Age of Onset  . Hypertension Other   . Hypertension Mother   . Diabetes Father      Review of Systems Positive for near syncope Negative for: General:  chills, fever, night sweats or weight changes.  Cardiovascular: PND orthopnea syncope dizziness  Dermatological skin lesions rashes Respiratory: Cough congestion Urologic: Frequent urination urination at night and hematuria Abdominal: negative for nausea, vomiting, diarrhea, bright red blood per rectum, melena, or hematemesis Neurologic: negative for visual changes, and/or hearing  changes  All other systems reviewed and are otherwise negative except as noted above.  Labs: Recent Labs    12/22/17 1244 12/22/17 1605 12/23/17 0200 12/23/17 0206  TROPONINI 0.07* 0.07* 0.07* 0.07*   Lab Results  Component Value Date   WBC 7.3 12/22/2017   HGB 12.4 (L) 12/22/2017   HCT 38.9 (L) 12/22/2017   MCV 88.0 12/22/2017   PLT 223 12/22/2017    Recent Labs  Lab 12/22/17 1244  NA 138  K 4.5  CL 107  CO2 23  BUN 35*  CREATININE 2.97*  CALCIUM 9.2  GLUCOSE 124*   Lab Results  Component Value Date   CHOL 166 04/21/2017   HDL 86 04/21/2017   LDLCALC 66 04/21/2017   TRIG 68 04/21/2017   No results found for: DDIMER  Radiology/Studies:  Dg Chest 2 View  Result Date: 12/22/2017 CLINICAL DATA:  Onset of chest pain at 10 a.m. today. Also unwitnessed fall in the bathroom. History of CHF, previous MI, previous CVA, dementia. EXAM: CHEST  2 VIEW COMPARISON:  Chest x-ray of August 06, 2017 FINDINGS: The lungs are adequately inflated.  There is no focal infiltrate. There is no pleural effusion. There calcification in the wall of the aortic arch. The heart and pulmonary vascularity are normal. The bony thorax exhibits no acute abnormality. IMPRESSION: There is no active cardiopulmonary disease. Thoracic aortic atherosclerosis. Electronically Signed   By: David  Martinique M.D.   On: 12/22/2017 12:42   Ct Head Wo Contrast  Result Date: 12/22/2017 CLINICAL DATA:  Unwitnessed fall.  Patient denies hitting head. EXAM: CT HEAD WITHOUT CONTRAST TECHNIQUE: Contiguous axial images were obtained from the base of the skull through the vertex without intravenous contrast. COMPARISON:  CT head dated July 06, 2015. FINDINGS: Brain: No evidence of acute infarction, hemorrhage, hydrocephalus, extra-axial collection or mass lesion/mass effect. Stable mild cerebral atrophy and extensive chronic microvascular ischemic white matter disease. Bilateral basal ganglia and left cerebellar lacunar infarcts are unchanged. Vascular: Atherosclerotic calcifications of the carotid siphons. No hyperdense vessel. Skull: Normal. Negative for fracture or focal lesion. Sinuses/Orbits: No acute finding. Other: None. IMPRESSION: 1.  No acute intracranial abnormality. 2. 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 Electronically Signed   By: Titus Dubin M.D.   On: 12/22/2017 13:10    EKG: Normal sinus rhythm  Weights: Filed Weights   12/22/17 1206  Weight: 67.1 kg (148 lb)     Physical Exam: Blood pressure (!) 141/81, pulse (!) 57, temperature 98.1 F (36.7 C), temperature source Oral, resp. rate 18, height '6\' 1"'$  (1.854 m), weight 67.1 kg (148 lb), SpO2 98 %. Body mass index is 19.53 kg/m. General: Well developed, well nourished, in no acute distress. Head eyes ears nose throat: Normocephalic, atraumatic, sclera non-icteric, no xanthomas, nares are without discharge. No apparent thyromegaly and/or mass  Lungs: Normal  respiratory effort.  no wheezes, no rales, no rhonchi.  Heart: RRR with normal S1 S2. no murmur gallop, no rub, PMI is normal size and placement, carotid upstroke normal without bruit, jugular venous pressure is normal Abdomen: Soft, non-tender, non-distended with normoactive bowel sounds. No hepatomegaly. No rebound/guarding. No obvious abdominal masses. Abdominal aorta is normal size without bruit Extremities: No edema. no cyanosis, no clubbing, no ulcers  Peripheral : 2+ bilateral upper extremity pulses, 2+ bilateral femoral pulses, 2+ bilateral dorsal pedal pulse Neuro: Alert and oriented. No facial asymmetry. No focal deficit. Moves all extremities spontaneously. Musculoskeletal: Normal muscle tone without kyphosis  Psych:  Responds to questions appropriately with a normal affect.    Assessment: 72 year old male with Alzheimer's essential hypertension mixed hyperlipidemia diabetes with previous cardiovascular disease having a mechanical fall and chest discomfort completely relieved at this time with no evidence of myocardial infarction ingestive heart failure or rhythm disturbances and improved  Plan: 1.  Continue supportive care with recent fall 2.  Begin ambulation following for any other rhythm disturbances or other changes of requiring further intervention 3.  No further cardiac diagnostics necessary at this time 4.  Continue high intensity cholesterol therapy for coronary artery disease history 5.  Continue hypertension medication management as before 6.  Isosorbide as necessary for chest discomfort 7.  If ambulating well okay for discharge home with follow-up with Dr. Ubaldo Glassing for further evaluation and treatment options for the possibility of link device  Signed, Corey Skains M.D. [facility] 12/23/2017, 8:32 AM

## 2017-12-24 DIAGNOSIS — N184 Chronic kidney disease, stage 4 (severe): Secondary | ICD-10-CM | POA: Diagnosis not present

## 2017-12-24 DIAGNOSIS — R748 Abnormal levels of other serum enzymes: Secondary | ICD-10-CM | POA: Diagnosis not present

## 2017-12-24 DIAGNOSIS — I429 Cardiomyopathy, unspecified: Secondary | ICD-10-CM | POA: Diagnosis not present

## 2017-12-24 DIAGNOSIS — R0789 Other chest pain: Secondary | ICD-10-CM | POA: Diagnosis not present

## 2017-12-24 DIAGNOSIS — F0390 Unspecified dementia without behavioral disturbance: Secondary | ICD-10-CM | POA: Diagnosis not present

## 2017-12-24 LAB — GLUCOSE, CAPILLARY
GLUCOSE-CAPILLARY: 90 mg/dL (ref 65–99)
GLUCOSE-CAPILLARY: 132 mg/dL — AB (ref 65–99)

## 2017-12-24 MED ORDER — INSULIN ASPART 100 UNIT/ML ~~LOC~~ SOLN: 0.0000 [IU] | Freq: Every day | SUBCUTANEOUS | 11 refills | Status: DC

## 2017-12-24 NOTE — Discharge Summary (Signed)
Kyle Short, is a 72 y.o. male  DOB 1946/04/23  MRN [id number].  Admission date:  12/22/2017  Admitting Physician  Nicholes Mango, MD  Discharge Date:  12/24/2017   Primary MD  Olin Hauser, DO  Recommendations for primary care physician for things to follow:   Follow with PCP in 1 week   Admission Diagnosis  Fall, initial encounter [W19.XXXA] Chest pain, unspecified type [R07.9]   Discharge Diagnosis  Fall, initial encounter [W19.XXXA] Chest pain, unspecified type [R07.9]   Active Problems:   Chest pain      Past Medical History:  Diagnosis Date  . Alcohol abuse   . Alzheimer's dementia with behavioral disturbance   . Anemia of chronic disease   . Cardiomyopathy due to hypertension, without heart failure ([place])   . Chronic kidney disease (CKD) stage G3b/A1, moderately decreased glomerular filtration rate (GFR) between 30-44 mL/min/1.73 square meter and albuminuria creatinine ratio less than 30 mg/g (HCC)   . Hyperlipidemia   . Lives in group home   . Myocardial infarction ([place])   . Stroke [facility])     Past Surgical History:  Procedure Laterality Date  . TONSILLECTOMY         History of present illness and  Hospital Course:     Kindly see H&P for history of present illness and admission details, please review complete Labs, Consult reports and Test reports for all details in brief  HPI  from the history and physical done on the day of admission 71 year old male patient with history of cardiovascular disease, previous MI, chronic kidney disease stage IV, Alzheimer's dementia came in because of fall.  Patient also mentioned chest pain so he is admitted for telemetry.   Hospital Course   #1 musculoskeletal chest pain.  Patient initially received aspirin, nitro tablets, admitted to telemetry.   Patient troponins are slightly elevated up to 0.07 without EKG changes seen by cardiology did not have any further cardiac workup.  Patient had echo on December 22, 2017 which showed EF 60-65% without any changes.  Patient denies any chest pain now.  Does have some cough without any hypoxia or leukocytosis. 2.  Alzheimer's dementia. 3.  Chronic kidney disease stage IV.  Baseline creatinine around 2.97.  Multiple falls due to history of CVA, Alzheimer's dementia.  Physical therapy recommended home health physical therapy.  Patient educated to use rolling walker at all times.   History of CVA: Patient can continue aspirin, statins,   Discharge Condition: Stable   Follow UP Follow-up with PCP in 1 week    Discharge Instructions  and  Discharge Medications    Allergies as of 12/24/2017   No Known Allergies     Medication List    TAKE these medications   ACCU-CHEK NANO SMARTVIEW w/Device Kit Use only for as needed glucose checks if symptomatic.   acetaminophen 325 MG tablet Commonly known as:  TYLENOL Take 650 mg by mouth every 6 (six) hours as needed.   aspirin 81 MG chewable tablet Commonly known as:  ASPIRIN LOW DOSE Chew 1 tablet (81 mg total) by mouth daily.   atorvastatin 10 MG tablet Commonly known as:  LIPITOR Take 1 tablet (10 mg total) by mouth daily.   bismuth subsalicylate 213 MG chewable tablet Commonly known as:  PEPTO BISMOL Chew 262 mg by mouth daily as needed for indigestion or diarrhea or loose stools.   carvedilol 12.5 MG tablet Commonly known as:  COREG Take 1 tablet (12.5 mg total)  by mouth 2 (two) times daily with a meal.   Cholecalciferol 2000 units Caps Commonly known as:  HM VITAMIN D3 Take 1 capsule (2,000 Units total) by mouth daily.   donepezil 5 MG tablet Commonly known as:  ARICEPT Take 1 tablet (5 mg total) by mouth at bedtime.   glucose blood test strip Commonly known as:  ACCU-CHEK SMARTVIEW Check once as needed only if symptoms of  hypoglycemia (dizziness, tremors, shaking)   hydrALAZINE 25 MG tablet Commonly known as:  APRESOLINE Take 1 tablet (25 mg total) by mouth 4 (four) times daily. What changed:  when to take this   hydrALAZINE 25 MG tablet Commonly known as:  APRESOLINE Take 1 tablet (25 mg total) by mouth 2 (two) times daily as needed (Elevated BP >160/100). What changed:  Another medication with the same name was changed. Make sure you understand how and when to take each.   insulin aspart 100 UNIT/ML injection Commonly known as:  novoLOG Inject 0-5 Units into the skin at bedtime.   isosorbide mononitrate 30 MG 24 hr tablet Commonly known as:  IMDUR Take 1 tablet (30 mg total) by mouth daily.   loperamide 2 MG capsule Commonly known as:  IMODIUM Take 2 mg by mouth as needed for diarrhea or loose stools.   magnesium hydroxide 400 MG/5ML suspension Commonly known as:  MILK OF MAGNESIA Take 15 mLs by mouth daily as needed for mild constipation.   Melatonin 10 MG Caps Take 10 mg by mouth at bedtime as needed.         Diet and Activity recommendation: See Discharge Instructions above   Consults obtained - cardiology   Major procedures and Radiology Reports - PLEASE review detailed and final reports for all details, in brief -      Dg Chest 2 View  Result Date: 12/22/2017 CLINICAL DATA:  Onset of chest pain at 10 a.m. today. Also unwitnessed fall in the bathroom. History of CHF, previous MI, previous CVA, dementia. EXAM: CHEST  2 VIEW COMPARISON:  Chest x-ray of August 06, 2017 FINDINGS: The lungs are adequately inflated. There is no focal infiltrate. There is no pleural effusion. There calcification in the wall of the aortic arch. The heart and pulmonary vascularity are normal. The bony thorax exhibits no acute abnormality. IMPRESSION: There is no active cardiopulmonary disease. Thoracic aortic atherosclerosis. Electronically Signed   By: David  Martinique M.D.   On: 12/22/2017 12:42   Ct Head  Wo Contrast  Result Date: 12/22/2017 CLINICAL DATA:  Unwitnessed fall.  Patient denies hitting head. EXAM: CT HEAD WITHOUT CONTRAST TECHNIQUE: Contiguous axial images were obtained from the base of the skull through the vertex without intravenous contrast. COMPARISON:  CT head dated July 06, 2015. FINDINGS: Brain: No evidence of acute infarction, hemorrhage, hydrocephalus, extra-axial collection or mass lesion/mass effect. Stable mild cerebral atrophy and extensive chronic microvascular ischemic white matter disease. Bilateral basal ganglia and left cerebellar lacunar infarcts are unchanged. Vascular: Atherosclerotic calcifications of the carotid siphons. No hyperdense vessel. Skull: Normal. Negative for fracture or focal lesion. Sinuses/Orbits: No acute finding. Other: None. IMPRESSION: 1.  No acute intracranial abnormality. 2. 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 Electronically Signed   By: Titus Dubin M.D.   On: 12/22/2017 13:10    Micro Results    Recent Results (from the past 240 hour(s))  MRSA PCR Screening     Status: None   Collection Time: 12/23/17  9:05 PM  Result  Value Ref Range Status   MRSA by PCR NEGATIVE NEGATIVE Final    Comment:        The GeneXpert MRSA Assay (FDA approved for NASAL specimens only), is one component of a comprehensive MRSA colonization surveillance program. It is not intended to diagnose MRSA infection nor to guide or monitor treatment for MRSA infections. Performed at [facility], [address], [place], [place] [address]        Today   Subjective:   Kyle Short today has no headache,no chest abdominal pain,no new weakness tingling or numbness, feels much better wants to go home today.   Objective:   Blood pressure (!) 144/88, pulse (!) 57, temperature 98.2 F (36.8 C), temperature source Oral, resp. rate 16, height '6\' 1"'$  (1.854 m), weight 67.1 kg (148 lb), SpO2 99  %.   Intake/Output Summary (Last 24 hours) at 12/24/2017 1228 Last data filed at 12/24/2017 1123 Gross per 24 hour  Intake 960 ml  Output 0 ml  Net 960 ml    Exam Awake Alert, Oriented x 3, No new F.N deficits, Normal affect [place].AT,PERRAL Supple Neck,No JVD, No cervical lymphadenopathy appriciated.  Symmetrical Chest wall movement, Good air movement bilaterally, CTAB RRR,No Gallops,Rubs or new Murmurs, No Parasternal Heave +ve B.Sounds, Abd Soft, Non tender, No organomegaly appriciated, No rebound -guarding or rigidity. No Cyanosis, Clubbing or edema, No new Rash or bruise  Data Review   CBC w Diff:  Lab Results  Component Value Date   WBC 7.3 12/22/2017   HGB 12.4 (L) 12/22/2017   HGB 10.7 (L) 01/17/2015   HCT 38.9 (L) 12/22/2017   HCT 33.8 (L) 01/17/2015   PLT 223 12/22/2017   PLT 186 01/17/2015   LYMPHOPCT 32 12/22/2017   LYMPHOPCT 27.8 12/15/2014   MONOPCT 10 12/22/2017   MONOPCT 19.5 12/15/2014   EOSPCT 2 12/22/2017   EOSPCT 3.7 12/15/2014   BASOPCT 1 12/22/2017   BASOPCT 0.4 12/15/2014    CMP:  Lab Results  Component Value Date   NA 138 12/22/2017   NA 143 04/14/2016   NA 140 01/17/2015   K 4.5 12/22/2017   K 5.0 01/17/2015   CL 107 12/22/2017   CL 110 (H) 01/17/2015   CO2 23 12/22/2017   CO2 24 01/17/2015   BUN 35 (H) 12/22/2017   BUN 28 (H) 04/14/2016   BUN 23 (H) 01/17/2015   CREATININE 2.97 (H) 12/22/2017   CREATININE 3.09 (H) 04/21/2017   PROT 7.7 04/21/2017   PROT 7.8 04/14/2016   PROT 7.5 01/17/2015   ALBUMIN 3.6 04/21/2017   ALBUMIN 4.1 04/14/2016   ALBUMIN 2.8 (L) 01/17/2015   BILITOT 0.3 04/21/2017   BILITOT 0.2 04/14/2016   BILITOT 0.2 01/17/2015   ALKPHOS 65 04/21/2017   ALKPHOS 113 01/17/2015   AST 17 04/21/2017   AST 57 (H) 01/17/2015   ALT 12 04/21/2017   ALT 46 01/17/2015  .   Total Time in preparing paper work, data evaluation and todays exam - 35 minutes  Epifanio Lesches M.D on 12/24/2017 at 12:28 PM    Note:  This dictation was prepared with Dragon dictation along with smaller phrase technology. Any transcriptional errors that result from this process are unintentional.

## 2017-12-24 NOTE — Plan of Care (Signed)
Patient has low urine output will bladder scan as needed to assess for retention.

## 2017-12-24 NOTE — Care Management (Signed)
Patient to discharge with home health physical therapy. Referral called to and accepted by [facility].  Have not received return call from guardian to discuss observation status and home health

## 2017-12-24 NOTE — Progress Notes (Signed)
Pt to be discharged back to group home today. Iv and tele removed. disch instructions given to care giver. Transport to be provided by facility.

## 2017-12-24 NOTE — NC FL2 (Signed)
[place] LEVEL OF CARE SCREENING TOOL     IDENTIFICATION  Patient Name: Kyle Short Birthdate: 09-03-1946 Sex: male Admission Date (Current Location): 12/22/2017  [facility] and [place] Number:  Engineering geologist and Address:  [facility], [address], [place], [place] [address]      Provider Number: [id number]  Attending Physician Name and Address:  Epifanio Lesches, MD  Relative Name and Phone Number:  Oziel, [place] Legal Guardian [phone] or Long,Rebecca Significant other [phone]     Current Level of Care: Hospital Recommended Level of Care: Family Care Home([facility].) Prior Approval Number:    Date Approved/Denied:   PASRR Number:    Discharge Plan: Domiciliary (Rest home)([facility])    Current Diagnoses: Patient Active Problem List   Diagnosis Date Noted  . Chest pain 12/22/2017  . Head trauma 12/13/2017  . History of alcohol abuse 12/13/2017  . Hypertension 12/13/2017  . Lives in group home 02/10/2017  . Secondary cardiomyopathy ([place]) 10/14/2015  . History of MI (myocardial infarction) 10/14/2015  . History of orthostatic hypotension 09/11/2015  . Syncope 09/11/2015  . Chronic kidney disease, stage 4, severely decreased GFR (HCC) 07/26/2015  . Benign essential HTN 07/26/2015  . Type 2 diabetes mellitus with chronic kidney disease ([place]) 07/25/2015  . Alcohol dependence in remission ([place]) 07/25/2015  . HLD (hyperlipidemia) 07/25/2015  . Cerebrovascular accident, old 07/25/2015  . Alzheimer's dementia with behavioral disturbance 06/07/2015  . Current tobacco use 02/26/2015  . Bilateral leg weakness 02/26/2015    Orientation RESPIRATION BLADDER Height & Weight     Self, Time  Normal Incontinent Weight: 148 lb (67.1 kg) Height:  _0  (185.4 cm)  BEHAVIORAL SYMPTOMS/MOOD NEUROLOGICAL BOWEL NUTRITION STATUS      Continent Diet(Regular)  AMBULATORY STATUS  COMMUNICATION OF NEEDS Skin   Supervision Verbally Normal                       Personal Care Assistance Level of Assistance  Bathing, Feeding, Dressing Bathing Assistance: Limited assistance Feeding assistance: Independent Dressing Assistance: Limited assistance     Functional Limitations Info  Sight, Hearing, Speech Sight Info: Adequate Hearing Info: Adequate Speech Info: Adequate    SPECIAL CARE FACTORS FREQUENCY  PT (By licensed PT)     PT Frequency: Home Health PT minimum 2x a week.              Contractures Contractures Info: Not present    Additional Factors Info  Code Status, Allergies, Insulin Sliding Scale Code Status Info: Full Code Allergies Info: NKA   Insulin Sliding Scale Info: insulin aspart (novoLOG) injection 0-9 Units 3x a day with meals       Current Medications (12/24/2017):  This is the current hospital active medication list Current Facility-Administered Medications  Medication Dose Route Frequency Provider Last Rate Last Dose  . acetaminophen (TYLENOL) tablet 650 mg  650 mg Oral Q6H PRN Gouru, Aruna, MD      . aspirin EC tablet 81 mg  81 mg Oral Daily Gouru, Aruna, MD   81 mg at 12/24/17 1017  . atorvastatin (LIPITOR) tablet 10 mg  10 mg Oral QPM Gouru, Aruna, MD   10 mg at 12/23/17 1745  . bismuth subsalicylate (PEPTO BISMOL) 262 MG/15ML suspension 15 mL  15 mL Oral Daily PRN Gouru, Aruna, MD      . carvedilol (COREG) tablet 12.5 mg  12.5 mg Oral BID WC Gouru, Aruna,  MD   12.5 mg at 12/24/17 0817  . cholecalciferol (VITAMIN D) tablet 2,000 Units  2,000 Units Oral Daily Gouru, Aruna, MD   2,000 Units at 12/24/17 1017  . donepezil (ARICEPT) tablet 5 mg  5 mg Oral QHS Gouru, Aruna, MD   5 mg at 12/23/17 2256  . heparin injection 5,000 Units  5,000 Units Subcutaneous Q8H Gouru, Illene Silver, MD   5,000 Units at 12/24/17 1309  . hydrALAZINE (APRESOLINE) tablet 25 mg  25 mg Oral QID Gouru, Aruna, MD   25 mg at 12/24/17 1309  . hydrALAZINE  (APRESOLINE) tablet 25 mg  25 mg Oral BID PRN Gouru, Aruna, MD      . insulin aspart (novoLOG) injection 0-5 Units  0-5 Units Subcutaneous QHS Gouru, Aruna, MD      . insulin aspart (novoLOG) injection 0-9 Units  0-9 Units Subcutaneous TID WC Gouru, Aruna, MD   1 Units at 12/24/17 1209  . isosorbide mononitrate (IMDUR) 24 hr tablet 30 mg  30 mg Oral Daily Gouru, Aruna, MD   30 mg at 12/24/17 1017  . loperamide (IMODIUM) capsule 2 mg  2 mg Oral PRN Gouru, Aruna, MD      . magnesium hydroxide (MILK OF MAGNESIA) suspension 15 mL  15 mL Oral Daily PRN Gouru, Aruna, MD      . Melatonin TABS 10 mg  10 mg Oral QHS PRN Gouru, Aruna, MD   10 mg at 12/22/17 2200  . ondansetron (ZOFRAN) injection 4 mg  4 mg Intravenous Q6H PRN Gouru, Aruna, MD      . sodium chloride flush (NS) 0.9 % injection 3 mL  3 mL Intravenous Q12H Epifanio Lesches, MD   3 mL at 12/23/17 2257     Discharge Medications: TAKE these medications   ACCU-CHEK NANO SMARTVIEW w/Device Kit Use only for as needed glucose checks if symptomatic.   acetaminophen 325 MG tablet Commonly known as:  TYLENOL Take 650 mg by mouth every 6 (six) hours as needed.   aspirin 81 MG chewable tablet Commonly known as:  ASPIRIN LOW DOSE Chew 1 tablet (81 mg total) by mouth daily.   atorvastatin 10 MG tablet Commonly known as:  LIPITOR Take 1 tablet (10 mg total) by mouth daily.   bismuth subsalicylate 086 MG chewable tablet Commonly known as:  PEPTO BISMOL Chew 262 mg by mouth daily as needed for indigestion or diarrhea or loose stools.   carvedilol 12.5 MG tablet Commonly known as:  COREG Take 1 tablet (12.5 mg total) by mouth 2 (two) times daily with a meal.   Cholecalciferol 2000 units Caps Commonly known as:  HM VITAMIN D3 Take 1 capsule (2,000 Units total) by mouth daily.   donepezil 5 MG tablet Commonly known as:  ARICEPT Take 1 tablet (5 mg total) by mouth at bedtime.   glucose blood test strip Commonly known as:   ACCU-CHEK SMARTVIEW Check once as needed only if symptoms of hypoglycemia (dizziness, tremors, shaking)   hydrALAZINE 25 MG tablet Commonly known as:  APRESOLINE Take 1 tablet (25 mg total) by mouth 4 (four) times daily. What changed:  when to take this   hydrALAZINE 25 MG tablet Commonly known as:  APRESOLINE Take 1 tablet (25 mg total) by mouth 2 (two) times daily as needed (Elevated BP >160/100). What changed:  Another medication with the same name was changed. Make sure you understand how and when to take each.   insulin aspart 100 UNIT/ML injection Commonly known as:  novoLOG  Inject 0-5 Units into the skin at bedtime.   isosorbide mononitrate 30 MG 24 hr tablet Commonly known as:  IMDUR Take 1 tablet (30 mg total) by mouth daily.   loperamide 2 MG capsule Commonly known as:  IMODIUM Take 2 mg by mouth as needed for diarrhea or loose stools.   magnesium hydroxide 400 MG/5ML suspension Commonly known as:  MILK OF MAGNESIA Take 15 mLs by mouth daily as needed for mild constipation.   Melatonin 10 MG Caps Take 10 mg by mouth at bedtime as needed.      Relevant Imaging Results:  Relevant Lab Results:   Additional Information SS# [id number]  Anell Barr

## 2017-12-30 ENCOUNTER — Telehealth: Payer: Self-pay | Admitting: Family Medicine

## 2017-12-30 ENCOUNTER — Inpatient Hospital Stay: Payer: Self-pay | Admitting: Family Medicine

## 2017-12-30 DIAGNOSIS — F411 Generalized anxiety disorder: Secondary | ICD-10-CM | POA: Diagnosis not present

## 2017-12-30 DIAGNOSIS — F322 Major depressive disorder, single episode, severe without psychotic features: Secondary | ICD-10-CM | POA: Diagnosis not present

## 2017-12-30 LAB — HEPATITIS C RNA, QUANTITATIVE, PCR
HCV LOG10: 4.1
HCV RNA, PCR, QN: 12600 (ref 15–100000000)

## 2017-12-30 LAB — HM HEPATITIS C SCREENING LAB: HM HEPATITIS C SCREENING: POSITIVE

## 2017-12-30 NOTE — Telephone Encounter (Signed)
This BP is acceptable if patient is asymptomatic. I would request repeat BP measurement to know more of the trend. If he has continued elevated diastolic BP >811 we may need to adjust meds.  Nobie Putnam, [facility] 12/30/2017, 5:23 PM

## 2017-12-30 NOTE — Telephone Encounter (Signed)
Received a call from [facility] state that  Pt  BP was 140/100/ [facility] call back # is  [phone].

## 2017-12-31 ENCOUNTER — Ambulatory Visit (INDEPENDENT_AMBULATORY_CARE_PROVIDER_SITE_OTHER): Payer: Medicare Other | Admitting: Family Medicine

## 2017-12-31 ENCOUNTER — Encounter: Payer: Self-pay | Admitting: Family Medicine

## 2017-12-31 VITALS — BP 110/59 | HR 68 | Temp 97.6°F | Resp 16 | Ht 73.0 in | Wt 144.6 lb

## 2017-12-31 DIAGNOSIS — I10 Essential (primary) hypertension: Secondary | ICD-10-CM

## 2017-12-31 DIAGNOSIS — G308 Other Alzheimer's disease: Secondary | ICD-10-CM | POA: Diagnosis not present

## 2017-12-31 DIAGNOSIS — R0789 Other chest pain: Secondary | ICD-10-CM | POA: Diagnosis not present

## 2017-12-31 DIAGNOSIS — F0281 Dementia in other diseases classified elsewhere with behavioral disturbance: Secondary | ICD-10-CM | POA: Diagnosis not present

## 2017-12-31 DIAGNOSIS — N184 Chronic kidney disease, stage 4 (severe): Secondary | ICD-10-CM

## 2017-12-31 DIAGNOSIS — E782 Mixed hyperlipidemia: Secondary | ICD-10-CM | POA: Diagnosis not present

## 2017-12-31 DIAGNOSIS — M62838 Other muscle spasm: Secondary | ICD-10-CM | POA: Diagnosis not present

## 2017-12-31 DIAGNOSIS — I429 Cardiomyopathy, unspecified: Secondary | ICD-10-CM

## 2017-12-31 DIAGNOSIS — F02818 Dementia in other diseases classified elsewhere, unspecified severity, with other behavioral disturbance: Secondary | ICD-10-CM

## 2017-12-31 MED ORDER — BACLOFEN 5 MG PO TABS: 5.0000 mg | ORAL_TABLET | Freq: Two times a day (BID) | ORAL | 2 refills | Status: DC | PRN

## 2017-12-31 MED ORDER — HYDRALAZINE HCL 25 MG PO TABS: 25.0000 mg | ORAL_TABLET | Freq: Two times a day (BID) | ORAL | 2 refills | Status: DC | PRN

## 2017-12-31 MED ORDER — ATORVASTATIN CALCIUM 10 MG PO TABS: 10.0000 mg | ORAL_TABLET | ORAL | 3 refills | Status: DC

## 2017-12-31 NOTE — Assessment & Plan Note (Signed)
Recent chest pain was atypical and not determined to be Cardiac - Improved ECHO results in hospital  Secondary to chronic HTN heart disease and h/o MI with ischemic cardiomyopathy, followed by Cardiology Dr Ubaldo Glassing - Clinically euvolemic - No changes today continue current meds

## 2017-12-31 NOTE — Assessment & Plan Note (Signed)
Stable Cr on last check 2.7-3 Chronic problem in setting of DM2, HTN Followed by Nephrology Dr Juleen China

## 2017-12-31 NOTE — Patient Instructions (Addendum)
Thank you for coming to the office today.   1. Reviewed history from hospital no new concerns at this time.  A1c 6.3 (January 10th)   Continue Donepezil  Changed Hydralazine PRN dose >150 and/or >100 either number  Continue other BP medications  2. If leg pain and cramping muscle aches do not improve with continued Physical Therapy - may try Baclofen 5mg  up to one dose twice daily as needed. Caution sedation.  Please schedule a Follow-up Appointment to: Return in about 3 months (around 03/31/2018) for Dementia, DM A1c, HTN med adjust.  If you have any other questions or concerns, please feel free to call the office or send a message through [facility]. You may also schedule an earlier appointment if necessary.  Additionally, you may be receiving a survey about your experience at our office within a few days to 1 week by e-mail or mail. We value your feedback.  Nobie Putnam, DO [place]

## 2017-12-31 NOTE — Assessment & Plan Note (Signed)
Stable without change See prior note for background Continue Donepezil 5mg  daily - limited diarrhea, seems to tolerate well, some improvement Follow-up as planned

## 2017-12-31 NOTE — Telephone Encounter (Signed)
As per PT, Kyle Short pt is refusing to take medication and blood pressure is one of them.

## 2017-12-31 NOTE — Assessment & Plan Note (Signed)
Improved BP, well controlled, lower end of normal, caution hypotension in past with pre-syncope - Followed by Cardiology, Nephrology  1. Continue current med list - Hydralazine 25mg  QID, Carvedilol 12.5mg  BID, Imdur 30mg  (24 hr) - will not change despite missing PM doses - Advised we can change Carvedilol 12.5mg  BID in future to a Metoprolol XL one daily dose instead - not renally cleared, would NOT use atenolol - Updated rx Hydralazine 25mg  BID PRN if BP SBP >150 and/OR DBP >100 2. Encouraged improve hydration, caution sudden standing / change of position, fall precaution, some postural dizziness may occur 3. Monitor BP at home 4. Follow-up as planned

## 2017-12-31 NOTE — Assessment & Plan Note (Signed)
Adjust dosing Atorvastatin from 10mg  PM to AM due to refuses PM meds

## 2017-12-31 NOTE — Progress Notes (Addendum)
Subjective:    Patient ID: Kyle Short, male    DOB: 1946/12/06, 72 y.o.   MRN: [id number]  Kyle Short is a 71 y.o. male presenting on 12/31/2017 for Hospitalization Follow-up (fall)  Patient accompanied by Melida Quitter (contact, [phone], owner of care home) [facility] [place]. He provides majority of history for patient.  HPI  HOSPITAL FOLLOW-UP VISIT  Hospital/Location: [facility] Date of Admission: 12/22/17 Date of Discharge: 12/24/17 Transitions of care telephone call: Not completed  Reason for Admission: Chest Pain Primary (+Secondary) Diagnosis: Atypical Chest Pain (musculoskeletal), Recurrent Falls, Alzheimer's Dementia   FOLLOW-UP - Hospital H&P and Discharge Summary have been reviewed - Patient presents today about 7 days after recent hospitalization. Brief summary of recent course, patient had symptoms of chest pain and not feeling well, fell in bathroom without syncope or other associated symptoms by report of caregivers, taken to hospital, had work up for chest pain had initial mild elevated troponin, admitted, ECHO normal LVEF otherwise no further work-up indicated, pain resolved, thought MSK, he had PT that was arranged for [facility] PT on discharge due to falls - Today reports overall has done well after discharge. Symptoms of chest pain have resolved. Also admits to some R lower extremity and R foot pain intermittently and some muscle spasms often. - He is still using rolling walker, getting more strength with HH PT started this week - No further falls  - New medications on discharge: None - Changes to current meds on discharge: None  Follow-up Dementia Reports since last visit he was started on Donepezil 5mg  daily in AM for memory, seems to tolerate well only mild occasional diarrhea, not regular. Seems to improve his cognitive function, difficult to determine, would like to continue - Additionally he reached out to Dementia resource and they were not  available any longer is what he mentioned  CHRONIC HTN: Reports patient still declining PM dosing meds. Including BP pills. Recent HH PT had reading >140/100, they used PRN Hydralazine 25mg  as written, asking to clarify BP for when to use PRN instead of >160/100 and consider changing other PM meds Current Meds - Carvedilol 12.5mg  BID (missing PM dose often), Hydralazine 25mg  QID (often missing PM dose), has Hydralazine 25mg  BID PRN elevated BP, Imdur 30mg  daily Reports good compliance, took meds today. Tolerating well, w/o complaints. - Limited activity Denies CP, dyspnea, HA, edema, dizziness / lightheadedness  Depression screen [facility] 2/9 02/24/2017 12/02/2015 10/14/2015  Decreased Interest 0 0 0  Down, Depressed, Hopeless 0 0 0  PHQ - 2 Score 0 0 0    Social History   Tobacco Use  . Smoking status: Current Every Day Smoker    Packs/day: 1.00    Types: Cigarettes  . Smokeless tobacco: Current User  Substance Use Topics  . Alcohol use: Yes    Alcohol/week: 6.0 oz    Types: 10 Cans of beer per week  . Drug use: No    Review of Systems Per HPI unless specifically indicated above     Objective:    BP (!) 110/59   Pulse 68   Temp 97.6 F (36.4 C) (Oral)   Resp 16   Ht 6\' 1"  (1.854 m)   Wt 144 lb 9.6 oz (65.6 kg)   BMI 19.08 kg/m   Wt Readings from Last 3 Encounters:  12/31/17 144 lb 9.6 oz (65.6 kg)  12/22/17 148 lb (67.1 kg)  10/06/17 150 lb (68 kg)    Physical Exam  Constitutional: He appears well-developed and well-nourished. No distress.  Elderly appearing male, currently well-appearing, comfortable, cooperative.  HENT:  Head: Normocephalic and atraumatic.  Mouth/Throat: Oropharynx is clear and moist.  Poor dentition without any permanent teeth. No dentures currently  Eyes: Conjunctivae and EOM are normal. Pupils are equal, round, and reactive to light.  Arcus senilis bilateral  Neck: Normal range of motion. Neck supple.  Cardiovascular: Normal rate, regular  rhythm, normal heart sounds and intact distal pulses.  No murmur heard. Pulmonary/Chest: Effort normal and breath sounds normal. No respiratory distress. He has no wheezes. He has no rales.  Abdominal: Soft. Bowel sounds are normal. He exhibits no distension. There is no tenderness.  Musculoskeletal: Normal range of motion. He exhibits no edema.  Rolling walker available needs assistance to stand  Right lower extremity Thin muscle atrophy ,similar to other larger muscle groups, no asymmetry No bony tenderness or deformity R knee ankle  No erythema or effusion No pain on R hip compression  Neurological: He is alert. Coordination normal.  Skin: Skin is warm and dry. No rash noted. He is not diaphoretic.  See DM foot exam for toenails below  Psychiatric: He has a normal mood and affect. His behavior is normal.  Limited verbal during history, speech is appropriate but sparse. Participates in exam and follows commands well. Limited insight into condition.  Nursing note and vitals reviewed.  Results for orders placed or performed during the hospital encounter of 12/22/17  MRSA PCR Screening  Result Value Ref Range   MRSA by PCR NEGATIVE NEGATIVE  CBC with Differential  Result Value Ref Range   WBC 7.3 3.8 - 10.6 K/uL   RBC 4.42 4.40 - 5.90 MIL/uL   Hemoglobin 12.4 (L) 13.0 - 18.0 g/dL   HCT 38.9 (L) 40.0 - 52.0 %   MCV 88.0 80.0 - 100.0 fL   MCH 28.1 26.0 - 34.0 pg   MCHC 31.9 (L) 32.0 - 36.0 g/dL   RDW 14.1 11.5 - 14.5 %   Platelets 223 150 - 440 K/uL   Neutrophils Relative % 55 %   Neutro Abs 4.1 1.4 - 6.5 K/uL   Lymphocytes Relative 32 %   Lymphs Abs 2.3 1.0 - 3.6 K/uL   Monocytes Relative 10 %   Monocytes Absolute 0.7 0.2 - 1.0 K/uL   Eosinophils Relative 2 %   Eosinophils Absolute 0.1 0 - 0.7 K/uL   Basophils Relative 1 %   Basophils Absolute 0.1 0 - 0.1 K/uL  Basic metabolic panel  Result Value Ref Range   Sodium 138 135 - 145 mmol/L   Potassium 4.5 3.5 - 5.1 mmol/L    Chloride 107 101 - 111 mmol/L   CO2 23 22 - 32 mmol/L   Glucose, Bld 124 (H) 65 - 99 mg/dL   BUN 35 (H) 6 - 20 mg/dL   Creatinine, Ser 2.97 (H) 0.61 - 1.24 mg/dL   Calcium 9.2 8.9 - 10.3 mg/dL   GFR calc non Af Amer 20 (L) >60 mL/min   GFR calc Af Amer 23 (L) >60 mL/min   Anion gap 8 5 - 15  Troponin I  Result Value Ref Range   Troponin I 0.07 (HH) <0.03 ng/mL  Troponin I-serum (0, 3, 6 hours)  Result Value Ref Range   Troponin I 0.07 (HH) <0.03 ng/mL  Troponin I-serum (0, 3, 6 hours)  Result Value Ref Range   Troponin I 0.07 (HH) <0.03 ng/mL  Hemoglobin A1c  Result Value Ref Range  Hgb A1c MFr Bld 6.3 (H) 4.8 - 5.6 %   Mean Plasma Glucose 134.11 mg/dL  Glucose, capillary  Result Value Ref Range   Glucose-Capillary 88 65 - 99 mg/dL  Glucose, capillary  Result Value Ref Range   Glucose-Capillary 130 (H) 65 - 99 mg/dL  Troponin I  Result Value Ref Range   Troponin I 0.07 (HH) <0.03 ng/mL  Glucose, capillary  Result Value Ref Range   Glucose-Capillary 97 65 - 99 mg/dL  Glucose, capillary  Result Value Ref Range   Glucose-Capillary 131 (H) 65 - 99 mg/dL  Glucose, capillary  Result Value Ref Range   Glucose-Capillary 86 65 - 99 mg/dL  Glucose, capillary  Result Value Ref Range   Glucose-Capillary 96 65 - 99 mg/dL  Glucose, capillary  Result Value Ref Range   Glucose-Capillary 90 65 - 99 mg/dL   Comment 1 Notify RN   Glucose, capillary  Result Value Ref Range   Glucose-Capillary 132 (H) 65 - 99 mg/dL  ECHOCARDIOGRAM COMPLETE  Result Value Ref Range   Weight 2,368 oz   Height 73 in   BP 171/97 mmHg      Assessment & Plan:   Problem List Items Addressed This Visit    Alzheimer's dementia with behavioral disturbance    Stable without evidence of worsening. Considered FAST Stage 6, dependent on most ADLs, but can feed independently by report. Has some mobility but is limited. - Some prior concerns wandering, but not problematic or severe - No concerns disruptive  or violent behavior  Patient will benefit from continued [place] services from home care, including PT, RN, and other [facility] services due to his dementia and cognitive decline  Continue Donepezil 5mg  daily - limited diarrhea, seems to tolerate well, some improvement Follow-up as planned - Consider future referral to Neurology as indicated if worsening, consider medications, however clinically does not seem like ideal candidate as meds may help memory but limited benefit for other aspects of illness       Relevant Medications   baclofen 5 MG TABS   Benign essential HTN - Primary    Improved BP, well controlled, lower end of normal, caution hypotension in past with pre-syncope - Followed by Cardiology, Nephrology  1. Continue current med list - Hydralazine 25mg  QID, Carvedilol 12.5mg  BID, Imdur 30mg  (24 hr) - will not change despite missing PM doses - Advised we can change Carvedilol 12.5mg  BID in future to a Metoprolol XL one daily dose instead - not renally cleared, would NOT use atenolol - Updated rx Hydralazine 25mg  BID PRN if BP SBP >150 and/OR DBP >100 2. Encouraged improve hydration, caution sudden standing / change of position, fall precaution, some postural dizziness may occur 3. Monitor BP at home 4. Follow-up as planned      Relevant Medications   atorvastatin (LIPITOR) 10 MG tablet   hydrALAZINE (APRESOLINE) 25 MG tablet   Chronic kidney disease, stage 4, severely decreased GFR (HCC)    Stable Cr on last check 2.7-3 Chronic problem in setting of DM2, HTN Followed by Nephrology Dr Juleen China      HLD (hyperlipidemia)    Adjust dosing Atorvastatin from 10mg  PM to AM due to refuses PM meds      Relevant Medications   atorvastatin (LIPITOR) 10 MG tablet   hydrALAZINE (APRESOLINE) 25 MG tablet   Secondary cardiomyopathy ([place])    Recent chest pain was atypical and not determined to be Cardiac - Improved ECHO results in hospital  Secondary to  chronic HTN heart disease and  h/o MI with ischemic cardiomyopathy, followed by Cardiology Dr Ubaldo Glassing - Clinically euvolemic - No changes today continue current meds      Relevant Medications   atorvastatin (LIPITOR) 10 MG tablet   hydrALAZINE (APRESOLINE) 25 MG tablet    Other Visit Diagnoses    Muscle spasm of right leg     Uncertain exact etiology, seems MSK related likely with some deconditioning weakness having muscle spasm and ache in leg, no evidence of deformity or bony injury. - Consider x-ray imaging in future if focal injury or not improving - Rx low dose baclofen 5mg  tabs BID PRN - caution dose >10mg  in 24 hour due to renal clearance    Relevant Medications   baclofen 5 MG TABS   Chest pain, atypical   RESOLVED - suspected atypical pain, negative work up in hospital Follow-up with Cardiology as indicated Continue current meds       Meds ordered this encounter  Medications  . atorvastatin (LIPITOR) 10 MG tablet    Sig: Take 1 tablet (10 mg total) by mouth every morning.    Dispense:  90 tablet    Refill:  3  . hydrALAZINE (APRESOLINE) 25 MG tablet    Sig: Take 1 tablet (25 mg total) by mouth 2 (two) times daily as needed (Elevated BP >121 systolic and/or >975 diastolic).    Dispense:  30 tablet    Refill:  2  . baclofen 5 MG TABS    Sig: Take 5 mg by mouth 2 (two) times daily as needed for muscle spasms.    Dispense:  60 tablet    Refill:  2    Follow up plan: Return in about 3 months (around 03/31/2018) for Dementia, DM A1c, HTN med adjust.  Nobie Putnam, DO [facility] 12/31/2017, 2:13 PM

## 2018-01-06 DIAGNOSIS — F411 Generalized anxiety disorder: Secondary | ICD-10-CM | POA: Diagnosis not present

## 2018-01-06 DIAGNOSIS — F322 Major depressive disorder, single episode, severe without psychotic features: Secondary | ICD-10-CM | POA: Diagnosis not present

## 2018-01-12 ENCOUNTER — Emergency Department: Payer: Medicare Other

## 2018-01-12 ENCOUNTER — Emergency Department
Admission: EM | Admit: 2018-01-12 | Discharge: 2018-01-13 | Disposition: A | Discharge status: Home / Self Care | Payer: Medicare Other | Attending: Emergency Medicine | Admitting: Emergency Medicine

## 2018-01-12 ENCOUNTER — Other Ambulatory Visit: Payer: Self-pay

## 2018-01-12 ENCOUNTER — Telehealth: Payer: Self-pay | Admitting: Family Medicine

## 2018-01-12 DIAGNOSIS — E1122 Type 2 diabetes mellitus with diabetic chronic kidney disease: Secondary | ICD-10-CM | POA: Insufficient documentation

## 2018-01-12 DIAGNOSIS — G308 Other Alzheimer's disease: Secondary | ICD-10-CM | POA: Insufficient documentation

## 2018-01-12 DIAGNOSIS — Z79899 Other long term (current) drug therapy: Secondary | ICD-10-CM | POA: Insufficient documentation

## 2018-01-12 DIAGNOSIS — I129 Hypertensive chronic kidney disease with stage 1 through stage 4 chronic kidney disease, or unspecified chronic kidney disease: Secondary | ICD-10-CM | POA: Insufficient documentation

## 2018-01-12 DIAGNOSIS — Z7982 Long term (current) use of aspirin: Secondary | ICD-10-CM | POA: Diagnosis not present

## 2018-01-12 DIAGNOSIS — R079 Chest pain, unspecified: Secondary | ICD-10-CM | POA: Insufficient documentation

## 2018-01-12 DIAGNOSIS — F17210 Nicotine dependence, cigarettes, uncomplicated: Secondary | ICD-10-CM | POA: Insufficient documentation

## 2018-01-12 DIAGNOSIS — F0281 Dementia in other diseases classified elsewhere with behavioral disturbance: Secondary | ICD-10-CM | POA: Insufficient documentation

## 2018-01-12 DIAGNOSIS — N184 Chronic kidney disease, stage 4 (severe): Secondary | ICD-10-CM | POA: Diagnosis not present

## 2018-01-12 DIAGNOSIS — R0789 Other chest pain: Secondary | ICD-10-CM | POA: Diagnosis not present

## 2018-01-12 DIAGNOSIS — R5381 Other malaise: Secondary | ICD-10-CM | POA: Diagnosis not present

## 2018-01-12 DIAGNOSIS — F0390 Unspecified dementia without behavioral disturbance: Secondary | ICD-10-CM | POA: Diagnosis not present

## 2018-01-12 LAB — BASIC METABOLIC PANEL
Anion gap: 13 (ref 5–15)
BUN: 53 mg/dL — AB (ref 6–20)
CALCIUM: 9.6 mg/dL (ref 8.9–10.3)
CO2: 18 mmol/L — ABNORMAL LOW (ref 22–32)
CREATININE: 3.91 mg/dL — AB (ref 0.61–1.24)
Chloride: 107 mmol/L (ref 101–111)
GFR calc Af Amer: 17 mL/min — ABNORMAL LOW (ref >60)
GFR, EST NON AFRICAN AMERICAN: 14 mL/min — AB (ref >60)
GLUCOSE: 134 mg/dL — AB (ref 65–99)
POTASSIUM: 4.2 mmol/L (ref 3.5–5.1)
SODIUM: 138 mmol/L (ref 135–145)

## 2018-01-12 LAB — CBC
HEMATOCRIT: 39.2 % — AB (ref 40.0–52.0)
Hemoglobin: 12.4 g/dL — ABNORMAL LOW (ref 13.0–18.0)
MCH: 27.4 pg (ref 26.0–34.0)
MCHC: 31.7 g/dL — AB (ref 32.0–36.0)
MCV: 86.5 fL (ref 80.0–100.0)
PLATELETS: 208 10*3/uL (ref 150–440)
RBC: 4.53 MIL/uL (ref 4.40–5.90)
RDW: 14.2 % (ref 11.5–14.5)
WBC: 18.9 10*3/uL — AB (ref 3.8–10.6)

## 2018-01-12 LAB — TROPONIN I: Troponin I: 0.15 ng/mL (ref <0.03)

## 2018-01-12 MED ORDER — ASPIRIN 81 MG PO CHEW
162.0000 mg | CHEWABLE_TABLET | Freq: Once | ORAL | Status: AC
  Administered 2018-01-13: 162 mg via ORAL
  Filled 2018-01-12: qty 2

## 2018-01-12 MED ORDER — HYDROCODONE-ACETAMINOPHEN 5-325 MG PO TABS
1.0000 | ORAL_TABLET | Freq: Once | ORAL | Status: AC
  Administered 2018-01-13: 1 via ORAL
  Filled 2018-01-12: qty 1

## 2018-01-12 NOTE — ED Notes (Signed)
Owner of group home Madelynn Done [phone] wants someone to call him when pt is ready for discharge, if pt is getting admitted or if there are any questions.

## 2018-01-12 NOTE — Telephone Encounter (Signed)
Received fax from [employer] for a lab result for Hepatitis C - HCV Quant test that is positive for a patient, who was being provided care for by an employee, and the employee had a needle stick exposure by report on 12/23/17. They were following blood borne pathogen protocol, and sent [place] copy of this test result.  He has not had prior Hep C screening in past.  Lab result is from [facility] and was abstracted to chart.  He is scheduled for apt tomorrow 1/31, otherwise we will reach out to patient and his primary caregiver / group care home, Melida Quitter to review this result, and discuss referral to GI specialist for further evaluation and treatment.  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  Nobie Putnam, [facility] 01/12/2018, 5:15 PM

## 2018-01-12 NOTE — ED Triage Notes (Signed)
Pt has felt generally weak today - c/o bilat rib pain and chest pain

## 2018-01-12 NOTE — ED Provider Notes (Signed)
[facility] Emergency Department Provider Note  ____________________________________________   First MD Initiated Contact with Patient 01/12/18 2348     (approximate)  I have reviewed the triage vital signs and the nursing notes.   HISTORY  Chief Complaint Chest Pain and Weakness  Level 5 exemption history limited by the patient's dementia  HPI Kyle Short is a 72 y.o. male who comes to the emergency department via EMS for generalized malaise, fatigue, and bilateral upper chest pain.  The pain is difficult for him to quantify.  It seems to be mostly constant somewhat worse with deep breath.  He denies fevers or chills.  He denies radiation.  He denies crushing pain.  He denies exertional pain.  Past Medical History:  Diagnosis Date  . Alcohol abuse   . Alzheimer's dementia with behavioral disturbance   . Anemia of chronic disease   . Cardiomyopathy due to hypertension, without heart failure ([place])   . Chronic kidney disease (CKD) stage G3b/A1, moderately decreased glomerular filtration rate (GFR) between 30-44 mL/min/1.73 square meter and albuminuria creatinine ratio less than 30 mg/g (HCC)   . Hyperlipidemia   . Lives in group home   . Myocardial infarction ([place])   . Stroke [facility])     Patient Active Problem List   Diagnosis Date Noted  . Chest pain 12/22/2017  . Head trauma 12/13/2017  . History of alcohol abuse 12/13/2017  . Hypertension 12/13/2017  . Lives in group home 02/10/2017  . Secondary cardiomyopathy ([place]) 10/14/2015  . History of MI (myocardial infarction) 10/14/2015  . History of orthostatic hypotension 09/11/2015  . Syncope 09/11/2015  . Chronic kidney disease, stage 4, severely decreased GFR (HCC) 07/26/2015  . Benign essential HTN 07/26/2015  . 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 Alcohol dependence in remission ([place]) 07/25/2015  . HLD (hyperlipidemia) 07/25/2015  . Cerebrovascular  accident, old 07/25/2015  . Alzheimer's dementia with behavioral disturbance 06/07/2015  . Current tobacco use 02/26/2015  . Bilateral leg weakness 02/26/2015    Past Surgical History:  Procedure Laterality Date  . TONSILLECTOMY      Prior to Admission medications   Medication Sig Start Date End Date Taking? Authorizing Provider  acetaminophen (TYLENOL) 325 MG tablet Take 650 mg by mouth every 6 (six) hours as needed.   Yes [provider]  aspirin (ASPIRIN LOW DOSE) 81 MG chewable tablet Chew 1 tablet (81 mg total) by mouth daily. 02/10/17  Yes Karamalegos, Devonne Doughty, DO  atorvastatin (LIPITOR) 10 MG tablet Take 1 tablet (10 mg total) by mouth every morning. 12/31/17  Yes Karamalegos, Devonne Doughty, DO  baclofen 5 MG TABS Take 5 mg by mouth 2 (two) times daily as needed for muscle spasms. 12/31/17  Yes Karamalegos, Devonne Doughty, DO  bismuth subsalicylate (PEPTO BISMOL) 262 MG chewable tablet Chew 262 mg by mouth daily as needed for indigestion or diarrhea or loose stools.   Yes [provider]  carvedilol (COREG) 12.5 MG tablet Take 1 tablet (12.5 mg total) by mouth 2 (two) times daily with a meal. 02/10/17  Yes Karamalegos, Devonne Doughty, DO  Cholecalciferol (HM VITAMIN D3) 2000 units CAPS Take 1 capsule (2,000 Units total) by mouth daily. 02/10/17  Yes Karamalegos, Alexander J, DO  donepezil (ARICEPT) 5 MG tablet Take 1 tablet (5 mg total) by mouth at bedtime. 11/25/17  Yes Karamalegos, Devonne Doughty, DO  hydrALAZINE (APRESOLINE) 25 MG tablet Take 1 tablet (25 mg total) by mouth 4 (  four) times daily. Patient taking differently: Take 25 mg by mouth 3 (three) times daily.  02/10/17  Yes Karamalegos, Devonne Doughty, DO  hydrALAZINE (APRESOLINE) 25 MG tablet Take 1 tablet (25 mg total) by mouth 2 (two) times daily as needed (Elevated BP >494 systolic and/or >496 diastolic). 12/31/17  Yes Karamalegos, Devonne Doughty, DO  isosorbide mononitrate (IMDUR) 30 MG 24 hr tablet Take 1 tablet (30 mg  total) by mouth daily. 02/10/17  Yes Karamalegos, Devonne Doughty, DO  loperamide (IMODIUM) 2 MG capsule Take 2 mg by mouth as needed for diarrhea or loose stools.   Yes [provider]  magnesium hydroxide (MILK OF MAGNESIA) 400 MG/5ML suspension Take 15 mLs by mouth daily as needed for mild constipation.   Yes [provider]  Melatonin 10 MG CAPS Take 10 mg by mouth at bedtime as needed. 02/10/17  Yes Karamalegos, Devonne Doughty, DO  Blood Glucose Monitoring Suppl (ACCU-CHEK NANO SMARTVIEW) w/Device KIT Use only for as needed glucose checks if symptomatic. 02/24/17   Karamalegos, Devonne Doughty, DO  glucose blood (ACCU-CHEK SMARTVIEW) test strip Check once as needed only if symptoms of hypoglycemia (dizziness, tremors, shaking) 02/24/17   Parks Ranger, Devonne Doughty, DO    Allergies Patient has no known allergies.  Family History  Problem Relation Age of Onset  . Hypertension Other   . Hypertension Mother   . Diabetes Father     Social History Social History   Tobacco Use  . Smoking status: Current Every Day Smoker    Packs/day: 1.00    Types: Cigarettes  . Smokeless tobacco: Current User  Substance Use Topics  . Alcohol use: Yes    Alcohol/week: 6.0 oz    Types: 10 Cans of beer per week  . Drug use: No    Review of Systems Level 5 exemption history limited by the patient's dementia  ____________________________________________   PHYSICAL EXAM:  VITAL SIGNS: ED Triage Vitals  Enc Vitals Group     BP 01/12/18 2143 (!) 156/102     Pulse Rate 01/12/18 2143 98     Resp 01/12/18 2143 15     Temp 01/12/18 2143 97.6 F (36.4 C)     Temp Source 01/12/18 2143 Oral     SpO2 01/12/18 2143 98 %     Weight 01/12/18 2141 160 lb (72.6 kg)     Height 01/12/18 2141 '6\' 1"'$  (1.854 m)     Head Circumference --      Peak Flow --      Pain Score --      Pain Loc --      Pain Edu? --      Excl. in [place]? --     Constitutional: Significant dementia.  Pleasant cooperative no acute  distress Eyes: PERRL EOMI. Head: Atraumatic. Nose: No congestion/rhinnorhea. Mouth/Throat: No trismus Neck: No stridor.   Cardiovascular: Normal rate, regular rhythm. Grossly normal heart sounds.  Good peripheral circulation. Quite tender over left greater than right anterior chest Respiratory: Normal respiratory effort.  No retractions. Lungs CTAB and moving good air Gastrointestinal: Soft nontender Musculoskeletal: No lower extremity edema   Neurologic:   No gross focal neurologic deficits are appreciated. Skin:  Skin is warm, dry and intact. No rash noted. Psychiatric: Significant dementia    ____________________________________________   DIFFERENTIAL includes but not limited to  Acute coronary syndrome, aortic dissection, pulmonary embolism, myocarditis, Boerhaave syndrome, musculoskeletal pain ____________________________________________   LABS (all labs ordered are listed, but only abnormal results are displayed)  Labs Reviewed  BASIC METABOLIC PANEL - Abnormal; Notable for the following components:      Result Value   CO2 18 (*)    Glucose, Bld 134 (*)    BUN 53 (*)    Creatinine, Ser 3.91 (*)    GFR calc non Af Amer 14 (*)    GFR calc Af Amer 17 (*)    All other components within normal limits  CBC - Abnormal; Notable for the following components:   WBC 18.9 (*)    Hemoglobin 12.4 (*)    HCT 39.2 (*)    MCHC 31.7 (*)    All other components within normal limits  TROPONIN I - Abnormal; Notable for the following components:   Troponin I 0.15 (*)    All other components within normal limits  TROPONIN I - Abnormal; Notable for the following components:   Troponin I 0.17 (*)    All other components within normal limits  URINALYSIS, COMPLETE (UACMP) WITH MICROSCOPIC    Lab work reviewed by me with elevated troponin although in the setting of end-stage renal disease no significant rise.  Elevated white count is nonspecific and likely secondary to pain and  stress __________________________________________  EKG  ED ECG REPORT I, Darel Hong, the attending physician, personally viewed and interpreted this ECG.  Date: 01/12/2018 EKG Time:  Rate: 99 Rhythm: normal sinus rhythm QRS Axis: Leftward axis Intervals: First-degree AV block ST/T Wave abnormalities: normal Narrative Interpretation: no evidence of acute ischemia  ____________________________________________  RADIOLOGY  Chest x-ray reviewed by me with no acute disease ____________________________________________   PROCEDURES  Procedure(s) performed: no  Procedures  Critical Care performed: no  Observation: no ____________________________________________   INITIAL IMPRESSION / ASSESSMENT AND PLAN / ED COURSE  Pertinent labs & imaging results that were available during my care of the patient were reviewed by me and considered in my medical decision making (see chart for details).  History is challenging to obtain secondary to the patient's dementia but his history does not sound like coronary artery disease.  He will require at least 2 troponins.  I appreciate the patient's first troponin is elevated but he has chronic kidney disease.  Second troponin is pending.  The patient's second troponin is roughly equivalent with the first.  He has been on monitor multiple hours with no ectopy.  His pain is improved.  At this point he is medically stable for outpatient management with cardiology follow-up.  He is discharged home in improved condition.  We have left messages for the patient's legal guardian      ____________________________________________   FINAL CLINICAL IMPRESSION(S) / ED DIAGNOSES  Final diagnoses:  Chest pain, unspecified type      NEW MEDICATIONS STARTED DURING THIS VISIT:  New Prescriptions   No medications on file     Note:  This document was prepared using Dragon voice recognition software and may include unintentional dictation  errors.     Darel Hong, MD 01/13/18 [phone]

## 2018-01-13 ENCOUNTER — Ambulatory Visit (INDEPENDENT_AMBULATORY_CARE_PROVIDER_SITE_OTHER): Payer: Medicare Other | Admitting: Family Medicine

## 2018-01-13 ENCOUNTER — Encounter: Payer: Self-pay | Admitting: Family Medicine

## 2018-01-13 VITALS — BP 101/61 | HR 80 | Temp 98.2°F | Resp 16 | Ht 73.0 in

## 2018-01-13 DIAGNOSIS — B182 Chronic viral hepatitis C: Secondary | ICD-10-CM

## 2018-01-13 DIAGNOSIS — G308 Other Alzheimer's disease: Secondary | ICD-10-CM | POA: Diagnosis not present

## 2018-01-13 DIAGNOSIS — N184 Chronic kidney disease, stage 4 (severe): Secondary | ICD-10-CM

## 2018-01-13 DIAGNOSIS — F0281 Dementia in other diseases classified elsewhere with behavioral disturbance: Secondary | ICD-10-CM

## 2018-01-13 DIAGNOSIS — R5383 Other fatigue: Secondary | ICD-10-CM | POA: Diagnosis not present

## 2018-01-13 DIAGNOSIS — F322 Major depressive disorder, single episode, severe without psychotic features: Secondary | ICD-10-CM | POA: Diagnosis not present

## 2018-01-13 DIAGNOSIS — R5381 Other malaise: Secondary | ICD-10-CM | POA: Diagnosis not present

## 2018-01-13 DIAGNOSIS — F411 Generalized anxiety disorder: Secondary | ICD-10-CM | POA: Diagnosis not present

## 2018-01-13 DIAGNOSIS — R531 Weakness: Secondary | ICD-10-CM

## 2018-01-13 DIAGNOSIS — N179 Acute kidney failure, unspecified: Secondary | ICD-10-CM

## 2018-01-13 DIAGNOSIS — R079 Chest pain, unspecified: Secondary | ICD-10-CM | POA: Diagnosis not present

## 2018-01-13 LAB — TROPONIN I: Troponin I: 0.17 ng/mL (ref <0.03)

## 2018-01-13 NOTE — ED Notes (Addendum)
Spoke with Kyle Short at Group home and he can not come get patient until 7:30am

## 2018-01-13 NOTE — ED Notes (Signed)
sleeping

## 2018-01-13 NOTE — ED Notes (Signed)
Call to [facility] for discharge. No answer

## 2018-01-13 NOTE — ED Notes (Signed)
Clement from [facility] to pick up pt, d/c instructions and follow up went over with caregiver. Pt in NAD at time of d/c, VS stable. Per primary nurse legal guardian has been contacted with no answer.

## 2018-01-13 NOTE — Progress Notes (Addendum)
Subjective:    Patient ID: Kyle Short, male    DOB: 03/11/1946, 72 y.o.   MRN: [id number]  Kyle Short is a 72 y.o. 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 Brief summary of recent course, patient had symptoms of weakness, and general malaise, found by side of bed not active as per usual, also had some muscle pain and chest pains, presented to ED, testing in ED with labs showed mild elevated troponin, negative chest x-ray, chemistry with worsening Cr 3.9 and elevated BUN 53, treated with aspirin, pain medicine, and monitoring, delta troponin stable and discharged back to ALF. - Today reports overall has not improved significantly after discharge, seems about same, now does not endorse chest pain anymore, but seems tired and weak still - Denies edema, dyspnea, cough, dysuria, nausea vomiting, abdominal pain, headache, syncope  - New medications on discharge: None - Changes to current meds on discharge: None  AoCKD-IV Followed by Dr Juleen China ([facility]), he has been followed for this problem, previously kidney function stable, now recent elevated labs Cr and BUN in ED.  Muscle Pain / Weakness Recently seen by me in office 1-2 weeks ago started on Baclofen low dose renal dosing 5mg  BID PRN, uncertain if helpful. He is also on atorvastatin among other medicines.  Additionally History of Positive Hepatitis C Quant HCV RNA - tested during hospital 12/23/17 after [facility] employee had accidental needle stick. He has not had prior dx hep C in past.  Depression screen [facility] 2/9 02/24/2017 12/02/2015 10/14/2015  Decreased Interest 0 0 0  Down, Depressed, Hopeless 0 0 0  PHQ  - 2 Score 0 0 0    Social History   Tobacco Use  . Smoking status: Current Every Day Smoker    Packs/day: 1.00    Types: Cigarettes  . Smokeless tobacco: Current User  Substance Use Topics  . Alcohol use: Yes    Alcohol/week: 6.0 oz    Types: 10 Cans of beer per week  . Drug use: No    Review of Systems Per HPI unless specifically indicated above     Objective:    BP 101/61   Pulse 80   Temp 98.2 F (36.8 C) (Oral)   Resp 16   Ht 6\' 1"  (1.854 m)   SpO2 93%   BMI 21.11 kg/m   Wt Readings from Last 3 Encounters:  01/12/18 160 lb (72.6 kg)  12/31/17 144 lb 9.6 oz (65.6 kg)  12/22/17 148 lb (67.1 kg)    Physical Exam  Constitutional: He appears well-developed and well-nourished. No distress.  Chronically ill appearing elderly male, currently tired-appearing wheelchair bound  HENT:  Head: Normocephalic and atraumatic.  Mouth/Throat: Oropharynx is clear and moist.  Poor dentition  Eyes: Conjunctivae and EOM are normal. Pupils are equal, round, and reactive to light.  Arcus senilis bilateral  Neck: Normal range of motion. Neck supple.  Cardiovascular: Normal rate, regular rhythm, normal heart sounds and intact distal pulses.  No murmur heard. Pulmonary/Chest: Effort normal and breath sounds normal. No respiratory distress. He has no wheezes. He has no rales.  Abdominal: Soft. Bowel sounds are normal. He exhibits no distension. There is  no tenderness.  Musculoskeletal: Normal range of motion. He exhibits no edema.  Neurological: He is alert.  Skin: Skin is warm and dry. No rash noted. He is not diaphoretic.  Psychiatric: His behavior is normal.  Limited verbal during history, speech is appropriate but sparse. Less active today but does still participate in exam and follows commands. Limited insight into condition.  Nursing note and vitals reviewed.  Results for orders placed or performed during the hospital encounter of 42/70/62  Basic metabolic panel  Result Value  Ref Range   Sodium 138 135 - 145 mmol/L   Potassium 4.2 3.5 - 5.1 mmol/L   Chloride 107 101 - 111 mmol/L   CO2 18 (L) 22 - 32 mmol/L   Glucose, Bld 134 (H) 65 - 99 mg/dL   BUN 53 (H) 6 - 20 mg/dL   Creatinine, Ser 3.91 (H) 0.61 - 1.24 mg/dL   Calcium 9.6 8.9 - 10.3 mg/dL   GFR calc non Af Amer 14 (L) >60 mL/min   GFR calc Af Amer 17 (L) >60 mL/min   Anion gap 13 5 - 15  CBC  Result Value Ref Range   WBC 18.9 (H) 3.8 - 10.6 K/uL   RBC 4.53 4.40 - 5.90 MIL/uL   Hemoglobin 12.4 (L) 13.0 - 18.0 g/dL   HCT 39.2 (L) 40.0 - 52.0 %   MCV 86.5 80.0 - 100.0 fL   MCH 27.4 26.0 - 34.0 pg   MCHC 31.7 (L) 32.0 - 36.0 g/dL   RDW 14.2 11.5 - 14.5 %   Platelets 208 150 - 440 K/uL  Troponin I  Result Value Ref Range   Troponin I 0.15 (HH) <0.03 ng/mL  Troponin I  Result Value Ref Range   Troponin I 0.17 (HH) <0.03 ng/mL      Assessment & Plan:   Problem List Items Addressed This Visit    Alzheimer's dementia with behavioral disturbance   Hepatitis C, chronic (HCC)    Other Visit Diagnoses    Generalized weakness    -  Primary   Malaise and fatigue       Acute renal failure superimposed on stage 4 chronic kidney disease, unspecified acute renal failure type (HCC)          Currently seems stable after ED visit, hemodynamically stable, CP seems resolved. No evidence of acute infection or fluid overload. Mental status seems at his cognitive baseline (dementia). Concern AoCKD-IV with abnormal Cr, however still somewhat within range of his known declining kidney function  Plan Called [facility] - spoke with on call Dr Juleen China, reviewed case, agreed based on lab results and clinical picture did not seem like he acutely needed direct admit to hospital in current state, does not seem to be uremic or otherwise acutely ill. Agreed to adjust meds - HOLD Baclofen, suspect may be building up in system still with reduced kidney function may be causing sedation and weakness, also next would recommend holding  atorvastatin in future if still symptomatic. Arranged close outpatient follow-up on Monday 01/17/18 with Dr Juleen China, likely repeat labs will be needed - Adjusted paperwork for his ALF - Updated new [place] may need to arrange short-term SNF in future - Also advised we may consider Palliative Care vs Hospice eval in future if not improving from this episode  Also recent Hepatitis C dx based on HCV RNA from hospital needlestick - he suspected to have chronic Hep C given his age and history. Suspect he may have been  asymptomatic from it. - Discussed with Melida Quitter, agree that we may pursue further eval / testing and treatment with referral once he starts to feel better. Likely refer to [facility] for further Hep C - will defer for now  No orders of the defined types were placed in this encounter.   Follow up plan: Return in about 2 weeks (around 01/27/2018), or if symptoms worsen or fail to improve, for weakness.  Nobie Putnam, [facility] 01/13/2018, 9:15 PM

## 2018-01-13 NOTE — ED Notes (Addendum)
Attempted to contact patient's legal guardian to let them know patient is going to be d/ced back to group home

## 2018-01-13 NOTE — Discharge Instructions (Signed)
Please follow-up with your cardiologist this coming Monday for reevaluation and return to the emergency department sooner for any concerns.  It was a pleasure to take care of you today, and thank you for coming to our emergency department.  If you have any questions or concerns before leaving please ask the nurse to grab me and I'm more than happy to go through your aftercare instructions again.  If you were prescribed any opioid pain medication today such as Norco, Vicodin, Percocet, morphine, hydrocodone, or oxycodone please make sure you do not drive when you are taking this medication as it can alter your ability to drive safely.  If you have any concerns once you are home that you are not improving or are in fact getting worse before you can make it to your follow-up appointment, please do not hesitate to call 911 and come back for further evaluation.  Darel Hong, MD  Results for orders placed or performed during the hospital encounter of 40/98/11  Basic metabolic panel  Result Value Ref Range   Sodium 138 135 - 145 mmol/L   Potassium 4.2 3.5 - 5.1 mmol/L   Chloride 107 101 - 111 mmol/L   CO2 18 (L) 22 - 32 mmol/L   Glucose, Bld 134 (H) 65 - 99 mg/dL   BUN 53 (H) 6 - 20 mg/dL   Creatinine, Ser 3.91 (H) 0.61 - 1.24 mg/dL   Calcium 9.6 8.9 - 10.3 mg/dL   GFR calc non Af Amer 14 (L) >60 mL/min   GFR calc Af Amer 17 (L) >60 mL/min   Anion gap 13 5 - 15  CBC  Result Value Ref Range   WBC 18.9 (H) 3.8 - 10.6 K/uL   RBC 4.53 4.40 - 5.90 MIL/uL   Hemoglobin 12.4 (L) 13.0 - 18.0 g/dL   HCT 39.2 (L) 40.0 - 52.0 %   MCV 86.5 80.0 - 100.0 fL   MCH 27.4 26.0 - 34.0 pg   MCHC 31.7 (L) 32.0 - 36.0 g/dL   RDW 14.2 11.5 - 14.5 %   Platelets 208 150 - 440 K/uL  Troponin I  Result Value Ref Range   Troponin I 0.15 (HH) <0.03 ng/mL  Troponin I  Result Value Ref Range   Troponin I 0.17 (HH) <0.03 ng/mL   Dg Chest 2 View  Result Date: 01/12/2018 CLINICAL DATA:  72 y/o  M; bilateral rib  and chest pain. EXAM: CHEST  2 VIEW COMPARISON:  12/22/2017 chest radiograph FINDINGS: Stable normal cardiac silhouette. Aortic atherosclerosis with calcification. Clear lungs. No pleural effusion or pneumothorax. No acute osseous abnormality is evident. IMPRESSION: No acute pulmonary process identified. Electronically Signed   By: Kristine Garbe M.D.   On: 01/12/2018 22:12   Dg Chest 2 View  Result Date: 12/22/2017 CLINICAL DATA:  Onset of chest pain at 10 a.m. today. Also unwitnessed fall in the bathroom. History of CHF, previous MI, previous CVA, dementia. EXAM: CHEST  2 VIEW COMPARISON:  Chest x-ray of August 06, 2017 FINDINGS: The lungs are adequately inflated. There is no focal infiltrate. There is no pleural effusion. There calcification in the wall of the aortic arch. The heart and pulmonary vascularity are normal. The bony thorax exhibits no acute abnormality. IMPRESSION: There is no active cardiopulmonary disease. Thoracic aortic atherosclerosis. Electronically Signed   By: David  Martinique M.D.   On: 12/22/2017 12:42   Ct Head Wo Contrast  Result Date: 12/22/2017 CLINICAL DATA:  Unwitnessed fall.  Patient denies hitting head. EXAM:  CT HEAD WITHOUT CONTRAST TECHNIQUE: Contiguous axial images were obtained from the base of the skull through the vertex without intravenous contrast. COMPARISON:  CT head dated July 06, 2015. FINDINGS: Brain: No evidence of acute infarction, hemorrhage, hydrocephalus, extra-axial collection or mass lesion/mass effect. Stable mild cerebral atrophy and extensive chronic microvascular ischemic white matter disease. Bilateral basal ganglia and left cerebellar lacunar infarcts are unchanged. Vascular: Atherosclerotic calcifications of the carotid siphons. No hyperdense vessel. Skull: Normal. Negative for fracture or focal lesion. Sinuses/Orbits: No acute finding. Other: None. IMPRESSION: 1.  No acute intracranial abnormality. 2. 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 Electronically Signed   By: Titus Dubin M.D.   On: 12/22/2017 13:10

## 2018-01-13 NOTE — Patient Instructions (Addendum)
Thank you for coming to the office today.  1. STOP Baclofen Future stop Atorvastatin if needed We may consider adjusting Donepezil as well  Follow-up with Dr Juleen China on Monday  May need repeat labs  If worsening symptoms as discussed - go to hospital ED again   Please schedule a Follow-up Appointment to: Return in about 2 weeks (around 01/27/2018), or if symptoms worsen or fail to improve, for weakness.    If you have any other questions or concerns, please feel free to call the office or send a message through [place]. You may also schedule an earlier appointment if necessary.  Additionally, you may be receiving a survey about your experience at our office within a few days to 1 week by e-mail or mail. We value your feedback.  Nobie Putnam, DO [place]

## 2018-01-13 NOTE — ED Notes (Addendum)
Called legal guardian and left message to inform them that patient would be returning to group home in the morning

## 2018-01-14 ENCOUNTER — Telehealth: Payer: Self-pay | Admitting: Family Medicine

## 2018-01-14 NOTE — Telephone Encounter (Signed)
Spoke to [place] PT and she was just letting PCP know but [facility] health PCP team has notified and taking care of patient now.

## 2018-01-14 NOTE — Telephone Encounter (Signed)
See last office visit from yesterday. My concern was his CKD worsening elevated Creatinine and generalized symptoms fatigue. Nephrology advised to HOLD Baclofen and also possibly hold Atorvastatin in future. I advised that if worsening symptoms over weekend, not improving he should return to hospital ED for re-evaluation may need repeat labs, I am concerned about him and even discussed possibility of Palliative Care in future.  Nobie Putnam, [facility] 01/14/2018, 12:33 PM

## 2018-01-14 NOTE — Telephone Encounter (Signed)
Anderson Malta with [facility] said pt's BP is 88/64 sitting in wheel chair and he is pretty lethargic.  Her call back number is [phone]

## 2018-01-17 DIAGNOSIS — E875 Hyperkalemia: Secondary | ICD-10-CM | POA: Diagnosis not present

## 2018-01-17 DIAGNOSIS — I129 Hypertensive chronic kidney disease with stage 1 through stage 4 chronic kidney disease, or unspecified chronic kidney disease: Secondary | ICD-10-CM | POA: Diagnosis not present

## 2018-01-17 DIAGNOSIS — R809 Proteinuria, unspecified: Secondary | ICD-10-CM | POA: Diagnosis not present

## 2018-01-17 DIAGNOSIS — N184 Chronic kidney disease, stage 4 (severe): Secondary | ICD-10-CM | POA: Diagnosis not present

## 2018-01-17 DIAGNOSIS — N179 Acute kidney failure, unspecified: Secondary | ICD-10-CM | POA: Diagnosis not present

## 2018-01-17 LAB — BASIC METABOLIC PANEL
BUN: 57 — AB (ref 4–21)
CREATININE: 2.8 — AB (ref <=1.3)
Glucose: 108
POTASSIUM: 4.6 (ref 3.4–5.3)
Sodium: 148 — AB (ref 137–147)

## 2018-01-17 LAB — CBC AND DIFFERENTIAL
HCT: 40 — AB (ref 41–53)
HEMOGLOBIN: 12.3 — AB (ref 13.5–17.5)
Neutrophils Absolute: 4
PLATELETS: 225 (ref 150–399)
WBC: 8.4

## 2018-01-18 ENCOUNTER — Telehealth: Payer: Self-pay | Admitting: Family Medicine

## 2018-01-18 NOTE — Telephone Encounter (Signed)
Left message for patient to call back  

## 2018-01-18 NOTE — Telephone Encounter (Signed)
Kyle Short with [facility] said pt's BP is 122/94.  Her call back numberis [phone]

## 2018-01-19 NOTE — Telephone Encounter (Signed)
Received fax with lab results from [place] Dr Juleen China for review. Patient has seen Nephrology this week in close follow-up, I am waiting to hear back from office visit information.  Abstracted lab results. Show improved Cr back to baseline it seems.  Nobie Putnam, [facility] 01/19/2018, 12:22 PM

## 2018-01-20 DIAGNOSIS — F322 Major depressive disorder, single episode, severe without psychotic features: Secondary | ICD-10-CM | POA: Diagnosis not present

## 2018-01-20 DIAGNOSIS — F411 Generalized anxiety disorder: Secondary | ICD-10-CM | POA: Diagnosis not present

## 2018-01-20 NOTE — Telephone Encounter (Signed)
Left message for patient to call back  

## 2018-01-24 ENCOUNTER — Telehealth: Payer: Self-pay

## 2018-01-24 DIAGNOSIS — I10 Essential (primary) hypertension: Secondary | ICD-10-CM

## 2018-01-24 DIAGNOSIS — R29898 Other symptoms and signs involving the musculoskeletal system: Secondary | ICD-10-CM

## 2018-01-24 MED ORDER — CARVEDILOL 6.25 MG PO TABS: 6.2500 mg | ORAL_TABLET | Freq: Two times a day (BID) | ORAL | 3 refills | Status: DC

## 2018-01-24 NOTE — Telephone Encounter (Signed)
He has had persistent issues with syncopal episodes and history of low BP, last discussion with me in 12/2017, however seen recently again 01/13/18 for hospital follow-up with similar but with Acute on Chronic Kidney injury.  ----------------------------------------------------------------------- I called and spoke with Madelynn Done caregiver to review the following he understands and agrees with plan  I am concerned that patient has limited capacity to report symptoms of hypotension, I would recommend starting to lower BP meds to see if we can avoid dropping BP in morning.  REDUCE dose Carvedilol instead of 12.5mg  pill twice daily - I have sent new rx for HALF dose - now Carvedilol 6.25mg  pill twice daily. I do understand that he often refuses PM dose, therefore, this med will likely only be morning only.  He may continue current dose Hydralazine 25mg  up to 3-4 times daily PRN for now - we may adjust this dose next if still low BP. ( did not have this listed as PRN but he reported today it is not everyday)  I would recommend checking BP BEFORE morning BP meds. If BP is >90/60 then proceed with normal meds. If BP is < 90 or bottom number < 60 then I would skip the morning dose of Carvedilol and Hydralazine. Before PM dose can check Bp again.  If he is symptomatic, lightheaded, dizzy, near passing out or worsening acute confusion (other than his baseline dementia) with a BP < 90/60 then he needs to be seen in hospital ED.   Nobie Putnam, [facility] 01/24/2018, 5:19 PM

## 2018-01-24 NOTE — Telephone Encounter (Signed)
[place] the patient caregiver called to get some type of protocol information on the patient blood pressure. He states the patient has had some low bp reading recently. He states it's been in the 80's / 60's in the morning, but the pt seems to be asymptomatic . He states this morning the patient bp was 89/69, but as the days progress his bp increases.  He wants to know what is consider to low for the patient and if his bp drops to low should he hold a medication. Please advise  [phone]

## 2018-01-27 DIAGNOSIS — F411 Generalized anxiety disorder: Secondary | ICD-10-CM | POA: Diagnosis not present

## 2018-01-27 DIAGNOSIS — F322 Major depressive disorder, single episode, severe without psychotic features: Secondary | ICD-10-CM | POA: Diagnosis not present

## 2018-01-28 ENCOUNTER — Other Ambulatory Visit: Payer: Self-pay | Admitting: Family Medicine

## 2018-01-28 DIAGNOSIS — I10 Essential (primary) hypertension: Secondary | ICD-10-CM

## 2018-01-31 ENCOUNTER — Telehealth: Payer: Self-pay | Admitting: Family Medicine

## 2018-01-31 NOTE — Telephone Encounter (Signed)
Reviewed. See last phone note 01/24/18. Reduced Carvedilol from 12.5mg  BID down to 6.25mg  BID.  Nobie Putnam, [facility] 01/31/2018, 1:23 PM

## 2018-01-31 NOTE — Telephone Encounter (Signed)
Kyle Short said pt's BP medication is working well.  His BP is good and is walking by himself now.  His call back number is [phone]

## 2018-02-03 DIAGNOSIS — F411 Generalized anxiety disorder: Secondary | ICD-10-CM | POA: Diagnosis not present

## 2018-02-03 DIAGNOSIS — F322 Major depressive disorder, single episode, severe without psychotic features: Secondary | ICD-10-CM | POA: Diagnosis not present

## 2018-02-10 DIAGNOSIS — F411 Generalized anxiety disorder: Secondary | ICD-10-CM | POA: Diagnosis not present

## 2018-02-10 DIAGNOSIS — F322 Major depressive disorder, single episode, severe without psychotic features: Secondary | ICD-10-CM | POA: Diagnosis not present

## 2018-02-14 ENCOUNTER — Telehealth: Payer: Self-pay | Admitting: Family Medicine

## 2018-02-14 NOTE — Telephone Encounter (Signed)
Kyle Short with Kyle Short said his correct fax number is [phone] for pt's order.  His call back number is [phone]

## 2018-02-15 NOTE — Telephone Encounter (Signed)
Spoke to [place] regarding patient has insurance issue not covering until 04/19 but they will work from old order.

## 2018-02-17 DIAGNOSIS — F322 Major depressive disorder, single episode, severe without psychotic features: Secondary | ICD-10-CM | POA: Diagnosis not present

## 2018-02-17 DIAGNOSIS — F411 Generalized anxiety disorder: Secondary | ICD-10-CM | POA: Diagnosis not present

## 2018-02-23 ENCOUNTER — Telehealth: Payer: Self-pay | Admitting: Family Medicine

## 2018-02-23 DIAGNOSIS — Z8673 Personal history of transient ischemic attack (TIA), and cerebral infarction without residual deficits: Secondary | ICD-10-CM

## 2018-02-23 DIAGNOSIS — F02818 Dementia in other diseases classified elsewhere, unspecified severity, with other behavioral disturbance: Secondary | ICD-10-CM

## 2018-02-23 DIAGNOSIS — G308 Other Alzheimer's disease: Principal | ICD-10-CM

## 2018-02-23 DIAGNOSIS — Z8679 Personal history of other diseases of the circulatory system: Secondary | ICD-10-CM

## 2018-02-23 DIAGNOSIS — F0281 Dementia in other diseases classified elsewhere with behavioral disturbance: Secondary | ICD-10-CM

## 2018-02-23 DIAGNOSIS — I6930 Unspecified sequelae of cerebral infarction: Secondary | ICD-10-CM

## 2018-02-23 DIAGNOSIS — R29898 Other symptoms and signs involving the musculoskeletal system: Secondary | ICD-10-CM

## 2018-02-23 NOTE — Telephone Encounter (Signed)
Kyle Short with [facility] asked if order had been sent to [facility] for 4 wheel rolling walker for pt.  Her call back number is [phone]

## 2018-02-24 DIAGNOSIS — F322 Major depressive disorder, single episode, severe without psychotic features: Secondary | ICD-10-CM | POA: Diagnosis not present

## 2018-02-24 DIAGNOSIS — F411 Generalized anxiety disorder: Secondary | ICD-10-CM | POA: Diagnosis not present

## 2018-02-24 NOTE — Telephone Encounter (Signed)
Left message for patient to call back  

## 2018-02-24 NOTE — Telephone Encounter (Signed)
Faxed paperwork 

## 2018-02-24 NOTE — Telephone Encounter (Signed)
Printed order in [place] for DME 4 wheel rolling walker.  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

## 2018-03-02 ENCOUNTER — Other Ambulatory Visit: Payer: Self-pay

## 2018-03-02 DIAGNOSIS — E559 Vitamin D deficiency, unspecified: Secondary | ICD-10-CM

## 2018-03-02 MED ORDER — CHOLECALCIFEROL 50 MCG (2000 UT) PO CAPS: 2000.0000 [IU] | ORAL_CAPSULE | Freq: Every day | ORAL | 3 refills | Status: DC

## 2018-03-03 DIAGNOSIS — F322 Major depressive disorder, single episode, severe without psychotic features: Secondary | ICD-10-CM | POA: Diagnosis not present

## 2018-03-03 DIAGNOSIS — F411 Generalized anxiety disorder: Secondary | ICD-10-CM | POA: Diagnosis not present

## 2018-03-10 ENCOUNTER — Ambulatory Visit: Payer: Medicaid Other | Admitting: Podiatry

## 2018-03-10 DIAGNOSIS — F322 Major depressive disorder, single episode, severe without psychotic features: Secondary | ICD-10-CM | POA: Diagnosis not present

## 2018-03-10 DIAGNOSIS — F411 Generalized anxiety disorder: Secondary | ICD-10-CM | POA: Diagnosis not present

## 2018-03-17 ENCOUNTER — Ambulatory Visit: Payer: Medicaid Other | Admitting: Podiatry

## 2018-03-17 ENCOUNTER — Encounter: Payer: Self-pay | Admitting: Podiatry

## 2018-03-17 DIAGNOSIS — B351 Tinea unguium: Secondary | ICD-10-CM | POA: Diagnosis not present

## 2018-03-17 DIAGNOSIS — M79675 Pain in left toe(s): Secondary | ICD-10-CM | POA: Diagnosis not present

## 2018-03-17 DIAGNOSIS — F411 Generalized anxiety disorder: Secondary | ICD-10-CM | POA: Diagnosis not present

## 2018-03-17 DIAGNOSIS — E1159 Type 2 diabetes mellitus with other circulatory complications: Secondary | ICD-10-CM

## 2018-03-17 DIAGNOSIS — M79674 Pain in right toe(s): Secondary | ICD-10-CM

## 2018-03-17 DIAGNOSIS — F322 Major depressive disorder, single episode, severe without psychotic features: Secondary | ICD-10-CM | POA: Diagnosis not present

## 2018-03-17 NOTE — Progress Notes (Signed)
Complaint:  Visit Type: Patient returns to my office for continued preventative foot care services. Complaint: Patient states" my nails have grown long and thick and become painful to walk and wear shoes" Patient has been diagnosed with DM with vascular disease. The patient presents for preventative foot care services. No changes to ROS  Podiatric Exam: Vascular: dorsalis pedis and posterior tibial pulses are not  palpable bilateral. Capillary return is immediate.  Cold feet noted. Skin turgor WNL  Sensorium: Normal Semmes Weinstein monofilament test. Normal tactile sensation bilaterally. Nail Exam: Pt has thick disfigured discolored nails with subungual debris noted bilateral entire nail hallux through fifth toenails Ulcer Exam: There is no evidence of ulcer or pre-ulcerative changes or infection. Orthopedic Exam: Muscle tone and strength are WNL. No limitations in general ROM. No crepitus or effusions noted. Foot type and digits show no abnormalities. Bony prominences are unremarkable. Skin: No Porokeratosis. No infection or ulcers  Diagnosis:  Onychomycosis, , Pain in right toe, pain in left toes  Treatment & Plan Procedures and Treatment: Consent by patient was obtained for treatment procedures.   Debridement of mycotic and hypertrophic toenails, 1 through 5 bilateral and clearing of subungual debris. No ulceration, no infection noted.  Return Visit-Office Procedure: Patient instructed to return to the office for a follow up visit 3 months for continued evaluation and treatment.    Gardiner Barefoot DPM

## 2018-03-24 DIAGNOSIS — F322 Major depressive disorder, single episode, severe without psychotic features: Secondary | ICD-10-CM | POA: Diagnosis not present

## 2018-03-24 DIAGNOSIS — F411 Generalized anxiety disorder: Secondary | ICD-10-CM | POA: Diagnosis not present

## 2018-03-30 ENCOUNTER — Other Ambulatory Visit: Payer: Self-pay | Admitting: Family Medicine

## 2018-03-30 ENCOUNTER — Ambulatory Visit: Payer: Self-pay | Admitting: Family Medicine

## 2018-03-31 DIAGNOSIS — F322 Major depressive disorder, single episode, severe without psychotic features: Secondary | ICD-10-CM | POA: Diagnosis not present

## 2018-03-31 DIAGNOSIS — F411 Generalized anxiety disorder: Secondary | ICD-10-CM | POA: Diagnosis not present

## 2018-04-06 ENCOUNTER — Ambulatory Visit: Payer: Self-pay | Admitting: Family Medicine

## 2018-04-07 ENCOUNTER — Ambulatory Visit (INDEPENDENT_AMBULATORY_CARE_PROVIDER_SITE_OTHER): Payer: Medicare Other | Admitting: Family Medicine

## 2018-04-07 ENCOUNTER — Encounter: Payer: Self-pay | Admitting: Family Medicine

## 2018-04-07 VITALS — BP 153/77 | HR 60 | Temp 97.9°F | Resp 16 | Ht 73.0 in | Wt 145.0 lb

## 2018-04-07 DIAGNOSIS — F411 Generalized anxiety disorder: Secondary | ICD-10-CM | POA: Diagnosis not present

## 2018-04-07 DIAGNOSIS — F0281 Dementia in other diseases classified elsewhere with behavioral disturbance: Secondary | ICD-10-CM

## 2018-04-07 DIAGNOSIS — F1021 Alcohol dependence, in remission: Secondary | ICD-10-CM

## 2018-04-07 DIAGNOSIS — G308 Other Alzheimer's disease: Secondary | ICD-10-CM | POA: Diagnosis not present

## 2018-04-07 DIAGNOSIS — E1122 Type 2 diabetes mellitus with diabetic chronic kidney disease: Secondary | ICD-10-CM | POA: Diagnosis not present

## 2018-04-07 DIAGNOSIS — N184 Chronic kidney disease, stage 4 (severe): Secondary | ICD-10-CM

## 2018-04-07 DIAGNOSIS — I10 Essential (primary) hypertension: Secondary | ICD-10-CM | POA: Diagnosis not present

## 2018-04-07 DIAGNOSIS — B182 Chronic viral hepatitis C: Secondary | ICD-10-CM

## 2018-04-07 DIAGNOSIS — F322 Major depressive disorder, single episode, severe without psychotic features: Secondary | ICD-10-CM | POA: Diagnosis not present

## 2018-04-07 DIAGNOSIS — F02818 Dementia in other diseases classified elsewhere, unspecified severity, with other behavioral disturbance: Secondary | ICD-10-CM

## 2018-04-07 LAB — POCT GLYCOSYLATED HEMOGLOBIN (HGB A1C): Hemoglobin A1C: 6.2 — AB (ref ?–5.7)

## 2018-04-07 MED ORDER — CARVEDILOL 6.25 MG PO TABS: 6.2500 mg | ORAL_TABLET | Freq: Two times a day (BID) | ORAL | 3 refills | Status: DC

## 2018-04-07 MED ORDER — DONEPEZIL HCL 5 MG PO TABS: 5.0000 mg | ORAL_TABLET | Freq: Every day | ORAL | 3 refills | Status: DC

## 2018-04-07 NOTE — Assessment & Plan Note (Signed)
Suspected confirmed based on recent lab test with ab positive and HCV RNA quant positive No prior history of this Prior h/o alcohol abuse At request of caregiver, will re-check Hep C ab and quant today, if positive still then proceed to [facility] referral for further eval, staging, [place] genotype and discuss treatment options

## 2018-04-07 NOTE — Progress Notes (Signed)
Subjective:    Patient ID: Kyle Short, male    DOB: 02-Dec-1946, 72 y.o.   MRN: [id number]  Kyle Short is a 72 y.o. male presenting on 04/07/2018 for Hypertension and Diabetes  Patient accompanied by Melida Quitter (contact, [phone], owner of care home) [facility] [place]. He provides majority of history for patient.  HPI   Request updated FL2 signed today.  CHRONIC HTN: Reports BP is doing overall well since med changes. He is on fewer meds, doing better. Now still checking BP BID - reviewed detailed log with avg BP 135/85 approx. Some elevated other readings lower, no evidence of hypotension. - Followed by Cardiology, Nephrology Current Meds - Carvedilol 6.25mg  BID, Imdur-24 hr 30mg  daily - OFF Hydralazine 25 QID, Lisinopril - No longer on PRN meds for BP Reports good compliance, took meds today. Tolerating well, w/o complaints. Lifestyle - Limited activity, some walking with assistance Denies CP, dyspnea, HA, edema, dizziness / lightheadedness  CHRONIC DM, Type 2 No new concerns. CBG normal. A1c today 6.2, improved to stable, off all meds including insulin. CBGs: Unremarkable, no low readings. Meds: None No longer on ACEi Complication with sensory ataxia per Neurology Due for DM eye exam needs referral. Due for DM Foot Denies hypoglycemia, polyuria, visual changes, numbness or tingling.  CKD-IV Followed by Dr Juleen China [facility]) Nephrology, last seen 01/2018, reviewed last office visit note. He was having significant side effects and sedation on multiple medications, was ultimately taken off Baclofen, Atorvastatin, Hydralazine, Lisinopril. He is doing much better.  FOLLOW-UP Alzheimer's Dementia with mild behavioral disturbance, history of CVA - Interval update - after recent history of med side effects and weakness, he has done much better cognitively Today patient/caregiver reports no new concerns. His mental status is unchanged to possibly improved  still on the Donepezil 5mg  daily, tolerating well without side effects. - They do not endorse any cognitive or functional decline. No problems with falls. He is tolerating PO well and has stable weight - No longer taking Melatonin for sleep - Denies agitation, violence, wandering, pain  Additional update - Prior lab for Hep C was positive screen antibody and quantitative test, this was a new problem for them, and due to recent illness hospitalization, they requested re test today instead of referral to GI.  PMH Alcohol Abuse in remission - has been sober for years.   Depression screen [facility] 2/9 04/07/2018 02/24/2017 12/02/2015  Decreased Interest 0 0 0  Down, Depressed, Hopeless 0 0 0  PHQ - 2 Score 0 0 0    Social History   Tobacco Use  . Smoking status: Current Every Day Smoker    Packs/day: 1.00    Types: Cigarettes  . Smokeless tobacco: Current User  Substance Use Topics  . Alcohol use: Never    Alcohol/week: 6.0 oz    Types: 10 Cans of beer per week    Frequency: Never  . Drug use: No    Review of Systems Per HPI unless specifically indicated above     Objective:    BP (!) 153/77   Pulse 60   Temp 97.9 F (36.6 C) (Oral)   Resp 16   Ht 6\' 1"  (1.854 m)   Wt 145 lb (65.8 kg)   BMI 19.13 kg/m   Wt Readings from Last 3 Encounters:  04/07/18 145 lb (65.8 kg)  01/12/18 160 lb (72.6 kg)  12/31/17 144 lb 9.6 oz (65.6 kg)    Physical Exam  Constitutional: He  appears well-developed and well-nourished. No distress.  Elderly appearing male, currently well-appearing, comfortable, cooperative.  HENT:  Head: Normocephalic and atraumatic.  Mouth/Throat: Oropharynx is clear and moist.  Poor dentition without any permanent teeth. No dentures currently  Eyes: Pupils are equal, round, and reactive to light. Conjunctivae and EOM are normal.  Neck: Normal range of motion.  Cardiovascular: Normal rate, regular rhythm, normal heart sounds and intact distal pulses.  No murmur  heard. Pulmonary/Chest: Effort normal and breath sounds normal. No respiratory distress. He has no wheezes. He has no rales.  Musculoskeletal: Normal range of motion. He exhibits no edema.  Able to ambulate w/o walker  Neurological: He is alert. Coordination normal.  Skin: Skin is warm and dry. No rash noted. He is not diaphoretic.  Psychiatric: He has a normal mood and affect. His behavior is normal.  Limited verbal during history, speech is appropriate but sparse, today he is more verbal. Participates in exam and follows commands well. Limited insight into condition.  Nursing note and vitals reviewed.    Recent Labs    08/06/17 1703 12/23/17 0206 04/07/18 1111  HGBA1C 6.5* 6.3* 6.2*    Results for orders placed or performed in visit on 04/07/18  POCT HgB A1C  Result Value Ref Range   Hemoglobin A1C 6.2 (A) 5.7      Assessment & Plan:   Problem List Items Addressed This Visit    Alcohol dependence in remission ([place])    Stable Former history of alcohol abuse, has been sober for years Likely contributing to his current dementia Follow-up as planned, will re-check Hep C after prior positive screen      Alzheimer's dementia with behavioral disturbance    Stable without change See prior note for background Continue Donepezil 5mg  daily - with notable improvement, refilled Follow-up as planned      Relevant Medications   donepezil (ARICEPT) 5 MG tablet   Benign essential HTN    Slightly elevated on average but still controlled BP Complication CKD-IV, Hypotension - Followed by Cardiology, Nephrology OFF Hydralazine, Lisinopril  1. Continue current med list - Carvedilol 6.25mg  BID - No more PRN BP medications 2. Encouraged improve hydration, caution sudden standing / change of position, fall precaution, some postural dizziness may occur 3. Monitor BP still - now reduce freq of BP checks from BID to DAILY - will reduce in future if still controlled 4. Follow-up 3 months       Relevant Medications   carvedilol (COREG) 6.25 MG tablet   Hepatitis C, chronic (HCC)    Suspected confirmed based on recent lab test with ab positive and HCV RNA quant positive No prior history of this Prior h/o alcohol abuse At request of caregiver, will re-check Hep C ab and quant today, if positive still then proceed to [facility] referral for further eval, staging, [place] genotype and discuss treatment options      Relevant Orders   Hepatitis C antibody   Type 2 diabetes mellitus with chronic kidney disease ([place]) - Primary    Well-controlled DM with A1c 6.2 from 6.3 - off all meds Complications - CKD-IV - OFF insulin  Plan:  1. Remain off all meds 2. Encourage improved lifestyle maintain appropriate diet 3. Check CBG , bring log to next visit for review 4. Continue ASA - OFF ACEi, Statin - side effects / hypotension 5. Follow-up 3 months A1c      Relevant Orders   POCT HgB A1C (Completed)      Meds  ordered this encounter  Medications  . carvedilol (COREG) 6.25 MG tablet    Sig: Take 1 tablet (6.25 mg total) by mouth 2 (two) times daily with a meal.    Dispense:  180 tablet    Refill:  3  . donepezil (ARICEPT) 5 MG tablet    Sig: Take 1 tablet (5 mg total) by mouth at bedtime.    Dispense:  90 tablet    Refill:  3    Follow up plan: Return in about 3 months (around 07/07/2018) for DM A1c, HTN, f/u Hep C.   Updated FL2 and Care Plan today, asked caregiver to provide copy of completed forms to be scanned into chart.  Nobie Putnam, [facility] 04/07/2018, 5:31 PM

## 2018-04-07 NOTE — Assessment & Plan Note (Signed)
Slightly elevated on average but still controlled BP Complication CKD-IV, Hypotension - Followed by Cardiology, Nephrology OFF Hydralazine, Lisinopril  1. Continue current med list - Carvedilol 6.25mg  BID - No more PRN BP medications 2. Encouraged improve hydration, caution sudden standing / change of position, fall precaution, some postural dizziness may occur 3. Monitor BP still - now reduce freq of BP checks from BID to DAILY - will reduce in future if still controlled 4. Follow-up 3 months

## 2018-04-07 NOTE — Assessment & Plan Note (Signed)
Stable Former history of alcohol abuse, has been sober for years Likely contributing to his current dementia Follow-up as planned, will re-check Hep C after prior positive screen

## 2018-04-07 NOTE — Assessment & Plan Note (Signed)
Well-controlled DM with A1c 6.2 from 6.3 - off all meds Complications - CKD-IV - OFF insulin  Plan:  1. Remain off all meds 2. Encourage improved lifestyle maintain appropriate diet 3. Check CBG , bring log to next visit for review 4. Continue ASA - OFF ACEi, Statin - side effects / hypotension 5. Follow-up 3 months A1c 

## 2018-04-07 NOTE — Patient Instructions (Addendum)
Thank you for coming to the office today.  A1c 6.2 - improved from 6.3, keep up the good work  BP is controlled on average, with current med, no change - reduce to DAILY BP check, then next time can reduce further  Continue Donepezil - Refilled at pharmacy  Refilled Carvedilol  Remain off the previous meds as discussed  Continue follow-up Dr Juleen China for kidney function  Re-check Hep C screening today, to confirm if this is a diagnosis, then would refer to GI Liver Specialist  [facility] [address] [place], [place] [address] Phone: [phone] [email or web address]  Please schedule a Follow-up Appointment to: Return in about 3 months (around 07/07/2018) for DM A1c, HTN, f/u Hep C.  If you have any other questions or concerns, please feel free to call the office or send a message through [place]. You may also schedule an earlier appointment if necessary.  Additionally, you may be receiving a survey about your experience at our office within a few days to 1 week by e-mail or mail. We value your feedback.  Nobie Putnam, DO [facility]

## 2018-04-07 NOTE — Assessment & Plan Note (Signed)
Stable without change See prior note for background Continue Donepezil 5mg  daily - with notable improvement, refilled Follow-up as planned

## 2018-04-08 ENCOUNTER — Telehealth: Payer: Self-pay | Admitting: Nurse Practitioner

## 2018-04-08 DIAGNOSIS — F0281 Dementia in other diseases classified elsewhere with behavioral disturbance: Secondary | ICD-10-CM

## 2018-04-08 DIAGNOSIS — G308 Other Alzheimer's disease: Principal | ICD-10-CM

## 2018-04-08 LAB — HEPATITIS C ANTIBODY
Hepatitis C Ab: REACTIVE — AB
SIGNAL TO CUT-OFF: 21 — ABNORMAL HIGH (ref <1.00)

## 2018-04-08 MED ORDER — DONEPEZIL HCL 5 MG PO TABS: 5.0000 mg | ORAL_TABLET | Freq: Every day | ORAL | 3 refills | Status: DC

## 2018-04-08 NOTE — Telephone Encounter (Signed)
Attempted to contact the pt, no answer. LMOM to return my call.  

## 2018-04-08 NOTE — Telephone Encounter (Signed)
Coreg prescription instructions will remain the same.  Morning and evening meals are okay.  This should be taken with food.  Aricept can be taken in the morning.  Instructions will change for the next fill.

## 2018-04-08 NOTE — Telephone Encounter (Signed)
Clement requesting that we change the pt prescriptions to Aricept 5MG  1 tablet am  And Coreg 6.25 (1) tablet bid morning and evening. Since its very difficult to get the patient to take medication at bedtime. He states this is how the patient been taking this medication per your previous orders. Please advise

## 2018-04-08 NOTE — Telephone Encounter (Signed)
Kyle Short brought pt to appt on Thursday and understood Dr. Raliegh Ip to say no changes to medications but there was differences when he went to pharmacy.  Also, pt does better taking meds in the morning and asked to have directions for aricept changed.  Please call [phone]

## 2018-04-11 LAB — HCV RNA,QUANTITATIVE REAL TIME PCR
HCV QUANT LOG: 3.99 {Log_IU}/mL — AB
HCV RNA, PCR, QN: 9870 [IU]/mL — AB

## 2018-04-12 ENCOUNTER — Telehealth: Payer: Self-pay | Admitting: Family Medicine

## 2018-04-12 NOTE — Telephone Encounter (Signed)
Called patient's caregiver, Melida Quitter after I received form requesting diagnosis codes and signature for [facility]). He has existing service and they need updated diagnoses and signed for continuing hours. He was just seen here in office on 04/07/18 and we have completed new FL2 with update.  We do not have a copy of the FL2, therefore I asked him to fax [place] a copy so that we can complete this new form. He will fax [facility] tomorrow 04/13/18 and we will try to complete new form by tomorrow as well.  Nobie Putnam, [facility] 04/12/2018, 3:33 PM

## 2018-04-13 NOTE — Telephone Encounter (Signed)
Received fax from Melida Quitter with copy of last FL2, to be scanned into chart. Signed 04/07/18.  The requested PCS form has been completed, and signed, dated 04/13/18. To be faxed back and Madelynn Done to be notified.  Nobie Putnam, [facility] 04/13/2018, 5:19 PM

## 2018-04-14 ENCOUNTER — Other Ambulatory Visit: Payer: Self-pay | Admitting: Family Medicine

## 2018-04-14 DIAGNOSIS — F322 Major depressive disorder, single episode, severe without psychotic features: Secondary | ICD-10-CM | POA: Diagnosis not present

## 2018-04-14 DIAGNOSIS — B1920 Unspecified viral hepatitis C without hepatic coma: Secondary | ICD-10-CM

## 2018-04-14 DIAGNOSIS — F411 Generalized anxiety disorder: Secondary | ICD-10-CM | POA: Diagnosis not present

## 2018-04-21 DIAGNOSIS — F322 Major depressive disorder, single episode, severe without psychotic features: Secondary | ICD-10-CM | POA: Diagnosis not present

## 2018-04-21 DIAGNOSIS — F411 Generalized anxiety disorder: Secondary | ICD-10-CM | POA: Diagnosis not present

## 2018-04-28 DIAGNOSIS — F411 Generalized anxiety disorder: Secondary | ICD-10-CM | POA: Diagnosis not present

## 2018-04-28 DIAGNOSIS — F322 Major depressive disorder, single episode, severe without psychotic features: Secondary | ICD-10-CM | POA: Diagnosis not present

## 2018-05-05 DIAGNOSIS — F322 Major depressive disorder, single episode, severe without psychotic features: Secondary | ICD-10-CM | POA: Diagnosis not present

## 2018-05-05 DIAGNOSIS — F411 Generalized anxiety disorder: Secondary | ICD-10-CM | POA: Diagnosis not present

## 2018-05-12 DIAGNOSIS — F411 Generalized anxiety disorder: Secondary | ICD-10-CM | POA: Diagnosis not present

## 2018-05-12 DIAGNOSIS — F322 Major depressive disorder, single episode, severe without psychotic features: Secondary | ICD-10-CM | POA: Diagnosis not present

## 2018-05-19 DIAGNOSIS — F322 Major depressive disorder, single episode, severe without psychotic features: Secondary | ICD-10-CM | POA: Diagnosis not present

## 2018-05-19 DIAGNOSIS — F411 Generalized anxiety disorder: Secondary | ICD-10-CM | POA: Diagnosis not present

## 2018-05-25 DIAGNOSIS — E872 Acidosis: Secondary | ICD-10-CM | POA: Diagnosis not present

## 2018-05-25 DIAGNOSIS — E1122 Type 2 diabetes mellitus with diabetic chronic kidney disease: Secondary | ICD-10-CM | POA: Diagnosis not present

## 2018-05-25 DIAGNOSIS — N184 Chronic kidney disease, stage 4 (severe): Secondary | ICD-10-CM | POA: Diagnosis not present

## 2018-05-25 DIAGNOSIS — I129 Hypertensive chronic kidney disease with stage 1 through stage 4 chronic kidney disease, or unspecified chronic kidney disease: Secondary | ICD-10-CM | POA: Diagnosis not present

## 2018-05-25 DIAGNOSIS — D631 Anemia in chronic kidney disease: Secondary | ICD-10-CM | POA: Diagnosis not present

## 2018-05-25 DIAGNOSIS — R809 Proteinuria, unspecified: Secondary | ICD-10-CM | POA: Diagnosis not present

## 2018-05-26 DIAGNOSIS — F411 Generalized anxiety disorder: Secondary | ICD-10-CM | POA: Diagnosis not present

## 2018-05-26 DIAGNOSIS — F322 Major depressive disorder, single episode, severe without psychotic features: Secondary | ICD-10-CM | POA: Diagnosis not present

## 2018-06-01 ENCOUNTER — Other Ambulatory Visit
Admission: RE | Admit: 2018-06-01 | Discharge: 2018-06-01 | Disposition: A | Discharge status: Home / Self Care | Payer: Medicare Other | Source: Ambulatory Visit | Attending: Gastroenterology | Admitting: Gastroenterology

## 2018-06-01 ENCOUNTER — Ambulatory Visit (INDEPENDENT_AMBULATORY_CARE_PROVIDER_SITE_OTHER): Payer: Medicare Other | Admitting: Gastroenterology

## 2018-06-01 ENCOUNTER — Encounter: Payer: Self-pay | Admitting: Gastroenterology

## 2018-06-01 ENCOUNTER — Other Ambulatory Visit: Payer: Self-pay

## 2018-06-01 VITALS — BP 158/90 | HR 62 | Ht 73.0 in | Wt 147.4 lb

## 2018-06-01 DIAGNOSIS — Z114 Encounter for screening for human immunodeficiency virus [HIV]: Secondary | ICD-10-CM

## 2018-06-01 DIAGNOSIS — B1920 Unspecified viral hepatitis C without hepatic coma: Secondary | ICD-10-CM

## 2018-06-01 LAB — COMPREHENSIVE METABOLIC PANEL
ALT: 16 U/L — ABNORMAL LOW (ref 17–63)
AST: 24 U/L (ref 15–41)
Albumin: 3.5 g/dL (ref 3.5–5.0)
Alkaline Phosphatase: 70 U/L (ref 38–126)
Anion gap: 7 (ref 5–15)
BILIRUBIN TOTAL: 0.6 mg/dL (ref 0.3–1.2)
BUN: 35 mg/dL — AB (ref 6–20)
CHLORIDE: 108 mmol/L (ref 101–111)
CO2: 24 mmol/L (ref 22–32)
Calcium: 9.1 mg/dL (ref 8.9–10.3)
Creatinine, Ser: 2.54 mg/dL — ABNORMAL HIGH (ref 0.61–1.24)
GFR calc Af Amer: 28 mL/min — ABNORMAL LOW (ref >60)
GFR calc non Af Amer: 24 mL/min — ABNORMAL LOW (ref >60)
Glucose, Bld: 85 mg/dL (ref 65–99)
POTASSIUM: 3.9 mmol/L (ref 3.5–5.1)
Sodium: 139 mmol/L (ref 135–145)
Total Protein: 8 g/dL (ref 6.5–8.1)

## 2018-06-01 LAB — PROTIME-INR
INR: 0.9
PROTHROMBIN TIME: 12.1 s (ref 11.4–15.2)

## 2018-06-01 NOTE — Patient Instructions (Signed)
You are scheduled for a RUQ abdominal US/Elastography at [facility] on Tues, June 25th @ 9:30am. Please arrive at the [facility] registration desk at 9:15am. You cannot have anything to eat or drink after midnight on Monday night.   If you need to reschedule this appointment for any reason, please contact central scheduling at [phone].

## 2018-06-01 NOTE — Progress Notes (Signed)
Jonathon Bellows MD, MRCP(U.K) [address]  [place]  [place], [place] [address]  Main: [phone]  Fax: [phone]   Primary Care Physician: Olin Hauser, DO  Primary Gastroenterologist:  Dr. Jonathon Bellows   Chief Complaint  Patient presents with  . New Patient (Initial Visit)    Hep C    HPI: KOI Kyle Short is a 72 y.o. male   He has been referred for hepatitis C. GFR 17 . Here with an aid. Denies every using drugs . Drank in excess in the past . Denies any tatoos. No Armed forces logistics/support/administrative officer. No blood transfusions. Never been treated for hepatitis.      Component     Latest Ref Rng & Units 01/17/2018 04/07/2018  Hemoglobin      13.5 - 17.5 12.3 (A)   HCT     41 - 53 40 (A)   NEUT#      4   Platelets     150 - 399 225   WBC      8.4   Glucose      108   BUN     4 - 21 57 (A)   Creatinine     0.6 - 1.3 2.8 (A)   Potassium     3.4 - 5.3 4.6   Sodium     137 - 147 148 (A)   Hepatitis C Ab     NON-REACTI  REACTIVE (A)  SIGNAL TO CUT-OFF     <1.00  21.00 (H)  HCV RNA, PCR, QN     NOT DETECT IU/mL  9,870 (H)  HCV Quantitative Log     NOT DETECT Log IU/mL  3.99 (H)  Hemoglobin A1C     5.7  6.2 (A)        Current Outpatient Medications  Medication Sig Dispense Refill  . acetaminophen (TYLENOL) 325 MG tablet Take 650 mg by mouth every 6 (six) hours as needed.    Marland Kitchen aspirin 81 MG chewable tablet TAKE 1 TABLET BY MOUTH ONCE DAILY FOR ANTICOAGULATION 90 tablet 3  . bismuth subsalicylate (PEPTO BISMOL) 262 MG chewable tablet Chew 262 mg by mouth daily as needed for indigestion or diarrhea or loose stools.    . Blood Glucose Monitoring Suppl (ACCU-CHEK NANO SMARTVIEW) w/Device KIT Use only for as needed glucose checks if symptomatic. 1 kit 0  . carvedilol (COREG) 6.25 MG tablet Take 1 tablet (6.25 mg total) by mouth 2 (two) times daily with a meal. 180 tablet 3  . Cholecalciferol (VITAMIN D3) 2000 units capsule TAKE 1 TABLET BY MOUTH ONCE DAILY FOR  SUPPLEMENT 90 capsule 3  . donepezil (ARICEPT) 5 MG tablet Take 1 tablet (5 mg total) by mouth daily. 90 tablet 3  . glucose blood (ACCU-CHEK SMARTVIEW) test strip Check once as needed only if symptoms of hypoglycemia (dizziness, tremors, shaking) 50 each 12  . isosorbide mononitrate (IMDUR) 30 MG 24 hr tablet TAKE 1 TABLET BY MOUTH ONCE DAILY FOR HEART. DO NOT CRUSH. 90 tablet 3  . loperamide (IMODIUM) 2 MG capsule Take 2 mg by mouth as needed for diarrhea or loose stools.    . magnesium hydroxide (MILK OF MAGNESIA) 400 MG/5ML suspension Take 15 mLs by mouth daily as needed for mild constipation.     No current facility-administered medications for this visit.     Allergies as of 06/01/2018  . (No Known Allergies)    ROS:  General: Negative for anorexia, weight loss, fever, chills, fatigue,  weakness. ENT: Negative for hoarseness, difficulty swallowing , nasal congestion. CV: Negative for chest pain, angina, palpitations, dyspnea on exertion, peripheral edema.  Respiratory: Negative for dyspnea at rest, dyspnea on exertion, cough, sputum, wheezing.  GI: See history of present illness. GU:  Negative for dysuria, hematuria, urinary incontinence, urinary frequency, nocturnal urination.  Endo: Negative for unusual weight change.    Physical Examination:   BP (!) 158/90   Pulse 62   Ht _0  (1.854 m)   Wt 147 lb 6.4 oz (66.9 kg)   BMI 19.45 kg/m   General: Well-nourished, well-developed in no acute distress.  Eyes: No icterus. Conjunctivae pink. Mouth: Oropharyngeal mucosa moist and pink , no lesions erythema or exudate. Lungs: Clear to auscultation bilaterally. Non-labored. Heart: Regular rate and rhythm, no murmurs rubs or gallops.  Abdomen: Bowel sounds are normal, nontender, nondistended, no hepatosplenomegaly or masses, no abdominal bruits or hernia , no rebound or guarding.   Extremities: No lower extremity edema. No clubbing or deformities. Neuro: Alert and oriented x 3.   Grossly intact. Skin: Warm and dry, no jaundice.   Psych: Alert and cooperative, ?impaired memory    Imaging Studies: No results found.  Assessment and Plan:   Kyle Short is a 72 y.o. y/o male for chronic hepatitis C. GT yet to be checked. He has advanced CKD. Await elastography to determine if he has cirrhosis. I will r/o co infection and if negative for hep B or HIV , can plan to treat with Daily fixed-dose combination of glecaprevir (300 mg)/pibrentasvir (120 mg) for 8 to 16 weeks.    Dr Jonathon Bellows  MD,MRCP [facility]) Follow up in 6 weeks

## 2018-06-02 DIAGNOSIS — F322 Major depressive disorder, single episode, severe without psychotic features: Secondary | ICD-10-CM | POA: Diagnosis not present

## 2018-06-02 DIAGNOSIS — F411 Generalized anxiety disorder: Secondary | ICD-10-CM | POA: Diagnosis not present

## 2018-06-02 LAB — HEPATITIS B SURFACE ANTIBODY, QUANTITATIVE: Hepatitis B-Post: <3.1 m[IU]/mL — ABNORMAL LOW (ref >9.9)

## 2018-06-02 LAB — HIV ANTIBODY (ROUTINE TESTING W REFLEX): HIV Screen 4th Generation wRfx: NONREACTIVE

## 2018-06-02 LAB — HEPATITIS B SURFACE ANTIGEN: HEP B S AG: NEGATIVE

## 2018-06-02 LAB — HEPATITIS A ANTIBODY, TOTAL: HEP A TOTAL AB: NEGATIVE

## 2018-06-02 LAB — HEPATITIS B CORE ANTIBODY, TOTAL: HEP B C TOTAL AB: NEGATIVE

## 2018-06-03 LAB — HEPATITIS C GENOTYPE

## 2018-06-05 ENCOUNTER — Encounter: Payer: Self-pay | Admitting: Gastroenterology

## 2018-06-07 ENCOUNTER — Ambulatory Visit: Payer: Self-pay

## 2018-06-07 ENCOUNTER — Ambulatory Visit: Payer: Medicare Other

## 2018-06-07 ENCOUNTER — Telehealth: Payer: Self-pay | Admitting: Gastroenterology

## 2018-06-07 NOTE — Telephone Encounter (Signed)
Advised Mr. Barbar of results and to advise about missing [place].   - Needs Hep A/B vaccine  Mr. Sedivy says he will rescheduled the ultrasound.

## 2018-06-07 NOTE — Telephone Encounter (Signed)
[facility] ultrasound dept called and patient was a no show for his u/s.

## 2018-06-09 DIAGNOSIS — F411 Generalized anxiety disorder: Secondary | ICD-10-CM | POA: Diagnosis not present

## 2018-06-09 DIAGNOSIS — F322 Major depressive disorder, single episode, severe without psychotic features: Secondary | ICD-10-CM | POA: Diagnosis not present

## 2018-06-13 ENCOUNTER — Ambulatory Visit: Payer: Medicaid Other | Admitting: Podiatry

## 2018-06-19 DIAGNOSIS — F411 Generalized anxiety disorder: Secondary | ICD-10-CM | POA: Diagnosis not present

## 2018-06-19 DIAGNOSIS — F322 Major depressive disorder, single episode, severe without psychotic features: Secondary | ICD-10-CM | POA: Diagnosis not present

## 2018-06-23 DIAGNOSIS — F322 Major depressive disorder, single episode, severe without psychotic features: Secondary | ICD-10-CM | POA: Diagnosis not present

## 2018-06-23 DIAGNOSIS — F411 Generalized anxiety disorder: Secondary | ICD-10-CM | POA: Diagnosis not present

## 2018-06-30 DIAGNOSIS — F322 Major depressive disorder, single episode, severe without psychotic features: Secondary | ICD-10-CM | POA: Diagnosis not present

## 2018-06-30 DIAGNOSIS — F411 Generalized anxiety disorder: Secondary | ICD-10-CM | POA: Diagnosis not present

## 2018-07-07 DIAGNOSIS — F411 Generalized anxiety disorder: Secondary | ICD-10-CM | POA: Diagnosis not present

## 2018-07-07 DIAGNOSIS — F322 Major depressive disorder, single episode, severe without psychotic features: Secondary | ICD-10-CM | POA: Diagnosis not present

## 2018-07-12 ENCOUNTER — Ambulatory Visit: Payer: Self-pay | Admitting: Family Medicine

## 2018-07-12 ENCOUNTER — Ambulatory Visit (INDEPENDENT_AMBULATORY_CARE_PROVIDER_SITE_OTHER): Payer: Medicare Other | Admitting: Family Medicine

## 2018-07-12 ENCOUNTER — Ambulatory Visit (INDEPENDENT_AMBULATORY_CARE_PROVIDER_SITE_OTHER): Payer: Medicare Other

## 2018-07-12 ENCOUNTER — Encounter: Payer: Self-pay | Admitting: Family Medicine

## 2018-07-12 VITALS — BP 152/84 | HR 60 | Temp 97.6°F | Resp 17 | Ht 73.0 in | Wt 145.8 lb

## 2018-07-12 VITALS — BP 138/74 | HR 60 | Temp 97.6°F | Resp 16 | Ht 73.0 in | Wt 145.8 lb

## 2018-07-12 DIAGNOSIS — G308 Other Alzheimer's disease: Secondary | ICD-10-CM

## 2018-07-12 DIAGNOSIS — Z0000 Encounter for general adult medical examination without abnormal findings: Secondary | ICD-10-CM | POA: Diagnosis not present

## 2018-07-12 DIAGNOSIS — B182 Chronic viral hepatitis C: Secondary | ICD-10-CM

## 2018-07-12 DIAGNOSIS — E1122 Type 2 diabetes mellitus with diabetic chronic kidney disease: Secondary | ICD-10-CM

## 2018-07-12 DIAGNOSIS — F0281 Dementia in other diseases classified elsewhere with behavioral disturbance: Secondary | ICD-10-CM

## 2018-07-12 DIAGNOSIS — N184 Chronic kidney disease, stage 4 (severe): Secondary | ICD-10-CM | POA: Diagnosis not present

## 2018-07-12 NOTE — Assessment & Plan Note (Signed)
Stable without change See prior note for background Continue Donepezil 5mg  daily - with notable improvement Follow-up as planned

## 2018-07-12 NOTE — Assessment & Plan Note (Signed)
Well-controlled DM with A1c 6.2 from 6.3 - off all meds Complications - CKD-IV - OFF insulin  Plan:  1. Remain off all meds 2. Encourage improved lifestyle maintain appropriate diet 3. Check CBG , bring log to next visit for review 4. Continue ASA - OFF ACEi, Statin - side effects / hypotension 5. Follow-up 3 months A1c

## 2018-07-12 NOTE — Assessment & Plan Note (Signed)
Follow-up as scheduled with [facility] for further testing / treatment for Hep C

## 2018-07-12 NOTE — Patient Instructions (Addendum)
Mr. Kyle Short , Thank you for taking time to come for your Medicare Wellness Visit. I appreciate your ongoing commitment to your health goals. Please review the following plan we discussed and let me know if I can assist you in the future.   Screening recommendations/referrals: Colonoscopy: declined  Recommended yearly ophthalmology/optometry visit for glaucoma screening and checkup- referral was sent to [facility]. Please call to schedule an appointment.   Recommended yearly dental visit for hygiene and checkup  Vaccinations: Influenza vaccine: due 08/2018 Pneumococcal vaccine: up to date Tdap vaccine: due, check with your insurance company for coverage  Shingles vaccine: shingrix eligible, check with your insurance company for coverage   Advanced directives: Please bring a copy of your health care power of attorney and living will to the office at your convenience.  Conditions/risks identified: smoking cessation discussed   Next appointment: Follow up in one year for your annual wellness exam.   Preventive Care 71 Years and Older, Male Preventive care refers to lifestyle choices and visits with your health care provider that can promote health and wellness. What does preventive care include?  A yearly physical exam. This is also called an annual well check.  Dental exams once or twice a year.  Routine eye exams. Ask your health care provider how often you should have your eyes checked.  Personal lifestyle choices, including:  Daily care of your teeth and gums.  Regular physical activity.  Eating a healthy diet.  Avoiding tobacco and drug use.  Limiting alcohol use.  Practicing safe sex.  Taking low doses of aspirin every day.  Taking vitamin and mineral supplements as recommended by your health care provider. What happens during an annual well check? The services and screenings done by your health care provider during your annual well check will depend on  your age, overall health, lifestyle risk factors, and family history of disease. Counseling  Your health care provider may ask you questions about your:  Alcohol use.  Tobacco use.  Drug use.  Emotional well-being.  Home and relationship well-being.  Sexual activity.  Eating habits.  History of falls.  Memory and ability to understand (cognition).  Work and work Statistician. Screening  You may have the following tests or measurements:  Height, weight, and BMI.  Blood pressure.  Lipid and cholesterol levels. These may be checked every 5 years, or more frequently if you are over 71 years old.  Skin check.  Lung cancer screening. You may have this screening every year starting at age 71 if you have a 30-pack-year history of smoking and currently smoke or have quit within the past 15 years.  Fecal occult blood test (FOBT) of the stool. You may have this test every year starting at age 71.  Flexible sigmoidoscopy or colonoscopy. You may have a sigmoidoscopy every 5 years or a colonoscopy every 10 years starting at age 71.  Prostate cancer screening. Recommendations will vary depending on your family history and other risks.  Hepatitis C blood test.  Hepatitis B blood test.  Sexually transmitted disease (STD) testing.  Diabetes screening. This is done by checking your blood sugar (glucose) after you have not eaten for a while (fasting). You may have this done every 1-3 years.  Abdominal aortic aneurysm (AAA) screening. You may need this if you are a current or former smoker.  Osteoporosis. You may be screened starting at age 71 if you are at high risk. Talk with your health care provider about your test results,  treatment options, and if necessary, the need for more tests. Vaccines  Your health care provider may recommend certain vaccines, such as:  Influenza vaccine. This is recommended every year.  Tetanus, diphtheria, and acellular pertussis (Tdap, Td) vaccine.  You may need a Td booster every 10 years.  Zoster vaccine. You may need this after age 50.  Pneumococcal 13-valent conjugate (PCV13) vaccine. One dose is recommended after age 71.  Pneumococcal polysaccharide (PPSV23) vaccine. One dose is recommended after age 71. Talk to your health care provider about which screenings and vaccines you need and how often you need them. This information is not intended to replace advice given to you by your health care provider. Make sure you discuss any questions you have with your health care provider. Document Released: 12/27/2015 Document Revised: 08/19/2016 Document Reviewed: 10/01/2015 Elsevier Interactive Patient Education  2017 [employer] Prevention in the Home Falls can cause injuries. They can happen to people of all ages. There are many things you can do to make your home safe and to help prevent falls. What can I do on the outside of my home?  Regularly fix the edges of walkways and driveways and fix any cracks.  Remove anything that might make you trip as you walk through a door, such as a raised step or threshold.  Trim any bushes or trees on the path to your home.  Use bright outdoor lighting.  Clear any walking paths of anything that might make someone trip, such as rocks or tools.  Regularly check to see if handrails are loose or broken. Make sure that both sides of any steps have handrails.  Any raised decks and porches should have guardrails on the edges.  Have any leaves, snow, or ice cleared regularly.  Use sand or salt on walking paths during winter.  Clean up any spills in your garage right away. This includes oil or grease spills. What can I do in the bathroom?  Use night lights.  Install grab bars by the toilet and in the tub and shower. Do not use towel bars as grab bars.  Use non-skid mats or decals in the tub or shower.  If you need to sit down in the shower, use a plastic, non-slip stool.  Keep the  floor dry. Clean up any water that spills on the floor as soon as it happens.  Remove soap buildup in the tub or shower regularly.  Attach bath mats securely with double-sided non-slip rug tape.  Do not have throw rugs and other things on the floor that can make you trip. What can I do in the bedroom?  Use night lights.  Make sure that you have a light by your bed that is easy to reach.  Do not use any sheets or blankets that are too big for your bed. They should not hang down onto the floor.  Have a firm chair that has side arms. You can use this for support while you get dressed.  Do not have throw rugs and other things on the floor that can make you trip. What can I do in the kitchen?  Clean up any spills right away.  Avoid walking on wet floors.  Keep items that you use a lot in easy-to-reach places.  If you need to reach something above you, use a strong step stool that has a grab bar.  Keep electrical cords out of the way.  Do not use floor polish or wax that makes floors  slippery. If you must use wax, use non-skid floor wax.  Do not have throw rugs and other things on the floor that can make you trip. What can I do with my stairs?  Do not leave any items on the stairs.  Make sure that there are handrails on both sides of the stairs and use them. Fix handrails that are broken or loose. Make sure that handrails are as long as the stairways.  Check any carpeting to make sure that it is firmly attached to the stairs. Fix any carpet that is loose or worn.  Avoid having throw rugs at the top or bottom of the stairs. If you do have throw rugs, attach them to the floor with carpet tape.  Make sure that you have a light switch at the top of the stairs and the bottom of the stairs. If you do not have them, ask someone to add them for you. What else can I do to help prevent falls?  Wear shoes that:  Do not have high heels.  Have rubber bottoms.  Are comfortable and fit  you well.  Are closed at the toe. Do not wear sandals.  If you use a stepladder:  Make sure that it is fully opened. Do not climb a closed stepladder.  Make sure that both sides of the stepladder are locked into place.  Ask someone to hold it for you, if possible.  Clearly mark and make sure that you can see:  Any grab bars or handrails.  First and last steps.  Where the edge of each step is.  Use tools that help you move around (mobility aids) if they are needed. These include:  Canes.  Walkers.  Scooters.  Crutches.  Turn on the lights when you go into a dark area. Replace any light bulbs as soon as they burn out.  Set up your furniture so you have a clear path. Avoid moving your furniture around.  If any of your floors are uneven, fix them.  If there are any pets around you, be aware of where they are.  Review your medicines with your doctor. Some medicines can make you feel dizzy. This can increase your chance of falling. Ask your doctor what other things that you can do to help prevent falls. This information is not intended to replace advice given to you by your health care provider. Make sure you discuss any questions you have with your health care provider. Document Released: 09/26/2009 Document Revised: 05/07/2016 Document Reviewed: 01/04/2015 Elsevier Interactive Patient Education  2017 [employer].

## 2018-07-12 NOTE — Patient Instructions (Addendum)
Thank you for coming to the office today.  Keep up the good work overall.  Blood sugar in past has been controlled - we will skip reading today. Next due for sugar fingerstick in 3 months.  BP is improved on re-check.  Keep on current medicines. No changes.  Please schedule a Follow-up Appointment to: Return in about 3 months (around 10/12/2018) for DM A1c, HTN, Dementia.  If you have any other questions or concerns, please feel free to call the office or send a message through [facility]. You may also schedule an earlier appointment if necessary.  Additionally, you may be receiving a survey about your experience at our office within a few days to 1 week by e-mail or mail. We value your feedback.  Nobie Putnam, DO [facility]

## 2018-07-12 NOTE — Assessment & Plan Note (Signed)
Followed by Nephrology Dr Odette Horns to DM2 and HTN No changes since previous visit Remains off Baclofen

## 2018-07-12 NOTE — Progress Notes (Signed)
Subjective:   Kyle Short is a 72 y.o. male who presents for Medicare Annual/Subsequent preventive examination.   Accompanied by Ileana Roup from [facility].   Review of Systems:     Cardiac Risk Factors include: advanced age (>46mn, >>67women);dyslipidemia;diabetes mellitus;hypertension;male gender;smoking/ tobacco exposure     Objective:    Vitals: BP (!) 152/84 (BP Location: Left Arm, Patient Position: Sitting)   Pulse 60   Temp 97.6 F (36.4 C) (Oral)   Resp 17   Ht '6\' 1"'$  (1.854 m)   Wt 145 lb 12.8 oz (66.1 kg)   BMI 19.24 kg/m   Body mass index is 19.24 kg/m.  Advanced Directives 07/12/2018 01/12/2018 12/22/2017 12/22/2017 08/06/2017 08/06/2017 08/06/2017  Does Patient Have a Medical Advance Directive? Yes Yes No No No No No  Type of [place] Living will - - - - -  Copy of HExtonin Chart? No - copy requested - - - - - -  Would patient like information on creating a medical advance directive? - - Yes (Inpatient - patient requests chaplain consult to create a medical advance directive) No - Patient declined No - Patient declined Yes (Inpatient - patient requests chaplain consult to create a medical advance directive) No - Patient declined    Tobacco Social History   Tobacco Use  Smoking Status Current Some Day Smoker  . Packs/day: 1.00  . Types: Cigarettes  Smokeless Tobacco Never Used     Ready to quit: Yes Counseling given: Yes   Clinical Intake:  Pre-visit preparation completed: Yes  Pain : No/denies pain     Nutritional Status: BMI of 19-24  Normal Nutritional Risks: None Diabetes: Yes CBG done?: No Did pt. bring in CBG monitor from home?: No  How often do you need to have someone help you when you read instructions, pamphlets, or other written materials from your doctor or pharmacy?: 1 - Never What is the last grade level you completed in school?: 11th  grade  Interpreter Needed?: No  Information entered by :: Zyan Mirkin,LPN   Past Medical History:  Diagnosis Date  . Alcohol abuse   . Alzheimer's dementia with behavioral disturbance   . Anemia of chronic disease   . Cardiomyopathy due to hypertension, without heart failure ([facility]   . Chronic kidney disease (CKD) stage G3b/A1, moderately decreased glomerular filtration rate (GFR) between 30-44 mL/min/1.73 square meter and albuminuria creatinine ratio less than 30 mg/g (HCC)   . Hyperlipidemia   . Lives in group home   . Myocardial infarction ([facility]   . St[facility] ([facility]    Past Surgical History:  Procedure Laterality Date  . TONSILLECTOMY     Family History  Problem Relation Age of Onset  . Hypertension Other   . Hypertension Mother   . Diabetes Father    Social History   Socioeconomic History  . Marital status: Divorced    Spouse name: Not on file  . Number of children: Not on file  . Years of education: Not on file  . Highest education level: 11th grade  Occupational History  . Not on file  Social Needs  . Financial resource strain: Not hard at all  . Food insecurity:    Worry: Never true    Inability: Never true  . Transportation needs:    Medical: No    Non-medical: No  Tobacco Use  . Smoking status: Current Some Day Smoker  Packs/day: 1.00    Types: Cigarettes  . Smokeless tobacco: Never Used  Substance and Sexual Activity  . Alcohol use: Not Currently    Alcohol/week: 6.0 oz    Types: 10 Cans of beer per week    Frequency: Never  . Drug use: No  . Sexual activity: Yes    Birth control/protection: Condom  Lifestyle  . Physical activity:    Days per week: 0 days    Minutes per session: 0 min  . Stress: Not at all  Relationships  . Social connections:    Talks on phone: More than three times a week    Gets together: More than three times a week    Attends religious service: More than 4 times per year    Active member of club or organization: No     Attends meetings of clubs or organizations: Never    Relationship status: Divorced  Other Topics Concern  . Not on file  Social History Narrative   Lives at [facility]     Outpatient Encounter Medications as of 07/12/2018  Medication Sig  . acetaminophen (TYLENOL) 325 MG tablet Take 650 mg by mouth every 6 (six) hours as needed.  Marland Kitchen alum & mag hydroxide-simeth (MAALOX/MYLANTA) 200-200-20 MG/5ML suspension Take by mouth every 6 (six) hours as needed for indigestion or heartburn.  Marland Kitchen aspirin 81 MG chewable tablet TAKE 1 TABLET BY MOUTH ONCE DAILY FOR ANTICOAGULATION  . baclofen (LIORESAL) 10 MG tablet Take 5 mg by mouth 2 (two) times daily.  Marland Kitchen bismuth subsalicylate (PEPTO BISMOL) 262 MG chewable tablet Chew 262 mg by mouth daily as needed for indigestion or diarrhea or loose stools.  . Blood Glucose Monitoring Suppl (ACCU-CHEK NANO SMARTVIEW) w/Device KIT Use only for as needed glucose checks if symptomatic.  . carvedilol (COREG) 6.25 MG tablet Take 1 tablet (6.25 mg total) by mouth 2 (two) times daily with a meal.  . Cholecalciferol (VITAMIN D3) 2000 units capsule TAKE 1 TABLET BY MOUTH ONCE DAILY FOR SUPPLEMENT  . donepezil (ARICEPT) 5 MG tablet Take 1 tablet (5 mg total) by mouth daily.  Marland Kitchen glucose blood (ACCU-CHEK SMARTVIEW) test strip Check once as needed only if symptoms of hypoglycemia (dizziness, tremors, shaking)  . guaifenesin (ROBITUSSIN) 100 MG/5ML syrup Take 200 mg by mouth 3 (three) times daily as needed for cough.  . isosorbide mononitrate (IMDUR) 30 MG 24 hr tablet TAKE 1 TABLET BY MOUTH ONCE DAILY FOR HEART. DO NOT CRUSH.  . loperamide (IMODIUM) 2 MG capsule Take 2 mg by mouth as needed for diarrhea or loose stools.  . magnesium hydroxide (MILK OF MAGNESIA) 400 MG/5ML suspension Take 15 mLs by mouth daily as needed for mild constipation.  . Melatonin 10 MG TABS Take by mouth at bedtime as needed.  . neomycin-bacitracin-polymyxin (NEOSPORIN) [phone] ointment Apply topically as  needed.   No facility-administered encounter medications on file as of 07/12/2018.     Activities of Daily Living In your present state of health, do you have any difficulty performing the following activities: 07/12/2018 12/22/2017  Hearing? N N  Vision? N N  Difficulty concentrating or making decisions? N Y  Walking or climbing stairs? Y Y  Comment balance issues  -  Dressing or bathing? N N  Doing errands, shopping? Tempie Donning  Comment care home provides trasnportation  -  Conservation officer, nature and eating ? N -  Using the Toilet? N -  In the past six months, have you accidently leaked urine? Y -  Comment depends  -  Do you have problems with loss of bowel control? Y -  Comment depends  -  Managing your Medications? Y -  Comment care home assists  -  Managing your Finances? N -  Housekeeping or managing your Housekeeping? Y -  Comment care home assists  -  Some recent data might be hidden    Patient Care Team: Olin Hauser, DO as PCP - General (Family Medicine) Lavonia Dana, MD as Consulting Physician (Internal Medicine) Gardiner Barefoot, DPM as Consulting Physician (Podiatry) Jonathon Bellows, MD as Consulting Physician (Gastroenterology)   Assessment:   This is a routine wellness examination for [place].  Exercise Activities and Dietary recommendations Current Exercise Habits: The patient does not participate in regular exercise at present, Exercise limited by: None identified  Goals    . Quit Smoking     Smoking cessation discussed        Fall Risk Fall Risk  07/12/2018 04/07/2018 02/24/2017 12/02/2015 10/14/2015  Falls in the past year? No No No No Yes  Number falls in past yr: - - - - 1  Injury with Fall? - - - - No  Risk for fall due to : - - - - Mental status change  Follow up - - - - Falls prevention discussed   Is the patient's home free of loose throw rugs in walkways, pet beds, electrical cords, etc?   yes      Grab bars in the bathroom? yes      Handrails on  the stairs?   no stairs       Adequate lighting?   yes  Timed Get Up and Go Performed:Completed in 8 seconds with no use of assistive devices, steady gait. No intervention needed at this time.   Depression Screen PHQ 2/9 Scores 07/12/2018 04/07/2018 02/24/2017 12/02/2015  PHQ - 2 Score 0 0 0 0    Cognitive Function MMSE - Mini Mental State Exam 05/13/2017  Orientation to time 1  Orientation to Place 2  Registration 3  Attention/ Calculation 0  Recall 2  Language- name 2 objects 2  Language- repeat 1  Language- follow 3 step command 3  Language- read & follow direction 0  Write a sentence 0  Copy design 0  Total score 14     6CIT Screen 07/12/2018  What Year? 0 points  What month? 0 points  What time? 0 points  Count back from 20 0 points  Months in reverse 0 points  Repeat phrase 2 points  Total Score 2    Immunization History  Administered Date(s) Administered  . Influenza, High Dose Seasonal PF 10/14/2015, 10/06/2017  . PPD Test 02/10/2017, 02/24/2017  . Pneumococcal Conjugate-13 10/06/2017  . Pneumococcal Polysaccharide-23 07/07/2015    Qualifies for Shingles Vaccine? Yes, discussed shingrix vaccine   Screening Tests Health Maintenance  Topic Date Due  . OPHTHALMOLOGY EXAM  01/20/2017  . COLONOSCOPY  08/04/2026 (Originally 09/29/1997)  . TETANUS/TDAP  10/30/2026 (Originally 09/29/1966)  . INFLUENZA VACCINE  07/14/2018  . FOOT EXAM  10/06/2018  . HEMOGLOBIN A1C  10/07/2018  . Hepatitis C Screening  Completed  . PNA vac Low Risk Adult  Completed   Cancer Screenings: Lung: Low Dose CT Chest recommended if Age 44-80 years, 30 pack-year currently smoking OR have quit w/in 15years. Patient does qualify. Declined  Colorectal: declined  Additional Screenings:  Hepatitis C Screening:completed 06/01/2018      Plan:    I have personally reviewed and addressed  the Medicare Annual Wellness questionnaire and have noted the following in the patient's chart:   A. Medical and social history B. Use of alcohol, tobacco or illicit drugs  C. Current medications and supplements D. Functional ability and status E.  Nutritional status F.  Physical activity G. Advance directives H. List of other physicians I.  Hospitalizations, surgeries, and ER visits in previous 12 months J.  [place] such as hearing and vision if needed, cognitive and depression L. Referrals and appointments   In addition, I have reviewed and discussed with patient certain preventive protocols, quality metrics, and best practice recommendations. A written personalized care plan for preventive services as well as general preventive health recommendations were provided to patient.   Signed,  Tyler Aas, LPN Nurse Health Advisor   Nurse Notes: discussed diabetic eye exam, care taker will make sure he sets up an appt with  eye center and have results faxed when completed.

## 2018-07-12 NOTE — Progress Notes (Signed)
Subjective:    Patient ID: Kyle Short, male    DOB: 01-14-1946, 72 y.o.   MRN: [id number]  Kyle Short is a 72 y.o. male presenting on 07/12/2018 for Hypertension   Patient accompanied by Blanch Media (contact, [phone], owner of care home) [facility] [place].She provides majority of history for patient.   HPI   Follow-up Hepatitis C Scheduled for tomorrow to return to [facility] Dr Vicente Males for Hepatitis C management. No new concerns today.  CHRONIC HTN: Monitoring BP and it has been controlled. - Followed by Cardiology, Nephrology Current Meds - Carvedilol 6.25mg  BID, Imdur-24 hr 30mg  daily Reports good compliance, took meds today. Tolerating well, w/o complaints. Lifestyle - Limited activity, some walking with assistance Denies CP, dyspnea, HA, edema, dizziness / lightheadedness  CHRONIC DM, Type 2 No falls or other new concerns No concerns on blood sugar. Last two readings A1c 6.2 to 6.3 Admits appetite is good, staying hydrated CBGs:Unremarkable, no low readings. Meds: None No longer on ACEi Complication with sensory ataxia per Neurology He will schedule visit with [facility] in October 2019 Denies hypoglycemia, polyuria, visual changes, numbness or tingling.  CKD-IV Followed by Dr Juleen China [facility]) Nephrology. He has done well off Baclofen as this was affecting him with side effects.  PMH - Alzheimer's Dementia with mild behavioral disturbance, history of CVA   Depression screen [facility] 2/9 07/12/2018 07/12/2018 04/07/2018  Decreased Interest 0 0 0  Down, Depressed, Hopeless 0 0 0  PHQ - 2 Score 0 0 0    Social History   Tobacco Use  . Smoking status: Current Some Day Smoker    Packs/day: 1.00    Types: Cigarettes  . Smokeless tobacco: Never Used  Substance Use Topics  . Alcohol use: Not Currently    Alcohol/week: 6.0 oz    Types: 10 Cans of beer per week    Frequency: Never  . Drug use: No    Review of Systems Per HPI unless specifically  indicated above     Objective:    BP 138/74 (BP Location: Left Arm, Cuff Size: Normal)   Pulse 60   Temp 97.6 F (36.4 C) (Oral)   Resp 16   Ht 6\' 1"  (1.854 m)   Wt 145 lb 12.8 oz (66.1 kg)   BMI 19.24 kg/m   Wt Readings from Last 3 Encounters:  07/12/18 145 lb 12.8 oz (66.1 kg)  07/12/18 145 lb 12.8 oz (66.1 kg)  06/01/18 147 lb 6.4 oz (66.9 kg)    Physical Exam  Constitutional: He appears well-developed and well-nourished. No distress.  Elderly appearing male, currently well-appearing, comfortable, cooperative.  HENT:  Head: Normocephalic and atraumatic.  Mouth/Throat: Oropharynx is clear and moist.  Poor dentition without any permanent teeth. No dentures currently  Eyes: Pupils are equal, round, and reactive to light. Conjunctivae and EOM are normal.  Neck: Normal range of motion.  Cardiovascular: Normal rate, regular rhythm, normal heart sounds and intact distal pulses.  No murmur heard. Pulmonary/Chest: Effort normal and breath sounds normal. No respiratory distress. He has no wheezes. He has no rales.  Musculoskeletal: Normal range of motion. He exhibits no edema.  Able to ambulate  Neurological: He is alert. Coordination normal.  Skin: Skin is warm and dry. No rash noted. He is not diaphoretic.  Psychiatric: He has a normal mood and affect. His behavior is normal.  Limited verbal during history, speech is appropriate but sparse, today he is more verbal. Participates in exam and follows  commands well. Limited insight into condition.  Nursing note and vitals reviewed.  Results for orders placed or performed during the hospital encounter of 06/01/18  HIV antibody  Result Value Ref Range   HIV Screen 4th Generation wRfx Non Reactive Non Reactive  Hepatitis B surface antibody  Result Value Ref Range   Hepatitis B-Post <3.1 (L) Immunity>9.9 mIU/mL  Hepatitis A antibody, total  Result Value Ref Range   Hep A Total Ab Negative Negative  Hepatitis B core antibody, total   Result Value Ref Range   Hep B Core Total Ab Negative Negative  Hepatitis B surface antigen  Result Value Ref Range   Hepatitis B Surface Ag Negative Negative  Protime-INR  Result Value Ref Range   Prothrombin Time 12.1 11.4 - 15.2 seconds   INR 0.90   Comprehensive metabolic panel  Result Value Ref Range   Sodium 139 135 - 145 mmol/L   Potassium 3.9 3.5 - 5.1 mmol/L   Chloride 108 101 - 111 mmol/L   CO2 24 22 - 32 mmol/L   Glucose, Bld 85 65 - 99 mg/dL   BUN 35 (H) 6 - 20 mg/dL   Creatinine, Ser 2.54 (H) 0.61 - 1.24 mg/dL   Calcium 9.1 8.9 - 10.3 mg/dL   Total Protein 8.0 6.5 - 8.1 g/dL   Albumin 3.5 3.5 - 5.0 g/dL   AST 24 15 - 41 U/L   ALT 16 (L) 17 - 63 U/L   Alkaline Phosphatase 70 38 - 126 U/L   Total Bilirubin 0.6 0.3 - 1.2 mg/dL   GFR calc non Af Amer 24 (L) >60 mL/min   GFR calc Af Amer 28 (L) >60 mL/min   Anion gap 7 5 - 15  Hepatitis C genotype  Result Value Ref Range   HCV Genotype 1a    Please Note (HCV): Comment       Assessment & Plan:   Problem List Items Addressed This Visit    Alzheimer's dementia with behavioral disturbance    Stable without change See prior note for background Continue Donepezil 5mg  daily - with notable improvement Follow-up as planned      Chronic kidney disease, stage 4, severely decreased GFR (HCC)    Followed by Nephrology Dr Juleen China [place] to DM2 and HTN No changes since previous visit Remains off Baclofen      Hepatitis C, chronic (HCC) - Primary    Follow-up as scheduled with [facility] for further testing / treatment for Hep C      Type 2 diabetes mellitus with chronic kidney disease ([place])    Well-controlled DM with A1c 6.2 from 6.3 - off all meds Complications - CKD-IV - OFF insulin  Plan:  1. Remain off all meds 2. Encourage improved lifestyle maintain appropriate diet 3. Check CBG , bring log to next visit for review 4. Continue ASA - OFF ACEi, Statin - side effects / hypotension 5. Follow-up 3 months A1c          No orders of the defined types were placed in this encounter.   Follow up plan: Return in about 3 months (around 10/12/2018) for DM A1c, HTN, Dementia.  Nobie Putnam, [facility] 07/12/2018, 1:18 PM

## 2018-07-13 ENCOUNTER — Ambulatory Visit (INDEPENDENT_AMBULATORY_CARE_PROVIDER_SITE_OTHER): Payer: Medicare Other | Admitting: Gastroenterology

## 2018-07-13 ENCOUNTER — Encounter: Payer: Self-pay | Admitting: Gastroenterology

## 2018-07-13 VITALS — BP 149/77 | HR 65 | Ht 73.0 in | Wt 145.2 lb

## 2018-07-13 DIAGNOSIS — B1920 Unspecified viral hepatitis C without hepatic coma: Secondary | ICD-10-CM

## 2018-07-13 NOTE — Progress Notes (Signed)
Jonathon Bellows MD, MRCP(U.K) [address]  [place]  [place], [place] [address]  Main: [phone]  Fax: [phone]   Primary Care Physician: Kyle Hauser, DO  Primary Gastroenterologist:  Dr. Jonathon Bellows   Chief Complaint  Patient presents with  . Follow-up    hospital Hep C    HPI: Kyle Short is a 72 y.o. male     Summary of history :  He is here today as a follow up for Chronic hepatitis C. Initially seen when referred in 05/2018. He has CKD with GFR of 17 .    Interval history   06/01/2018-  07/13/2018   Has not had the USG abdomen with elastography performed.   Labs 05/2018 : GT 1a, Cr 2.54, INR 0.90 .HIV,HbsAg,  negative   Not immune to hep A/B. Here with a care helper. Patient denies any new issues here to catch up on lab test results.   Current Outpatient Medications  Medication Sig Dispense Refill  . acetaminophen (TYLENOL) 325 MG tablet Take 650 mg by mouth every 6 (six) hours as needed.    Marland Kitchen alum & mag hydroxide-simeth (MAALOX/MYLANTA) 200-200-20 MG/5ML suspension Take by mouth every 6 (six) hours as needed for indigestion or heartburn.    Marland Kitchen aspirin 81 MG chewable tablet TAKE 1 TABLET BY MOUTH ONCE DAILY FOR ANTICOAGULATION 90 tablet 3  . baclofen (LIORESAL) 10 MG tablet Take 5 mg by mouth 2 (two) times daily.    Marland Kitchen bismuth subsalicylate (PEPTO BISMOL) 262 MG chewable tablet Chew 262 mg by mouth daily as needed for indigestion or diarrhea or loose stools.    . Blood Glucose Monitoring Suppl (ACCU-CHEK NANO SMARTVIEW) w/Device KIT Use only for as needed glucose checks if symptomatic. 1 kit 0  . carvedilol (COREG) 6.25 MG tablet Take 1 tablet (6.25 mg total) by mouth 2 (two) times daily with a meal. 180 tablet 3  . Cholecalciferol (VITAMIN D3) 2000 units capsule TAKE 1 TABLET BY MOUTH ONCE DAILY FOR SUPPLEMENT 90 capsule 3  . donepezil (ARICEPT) 5 MG tablet Take 1 tablet (5 mg total) by mouth daily. 90 tablet 3  . glucose blood (ACCU-CHEK  SMARTVIEW) test strip Check once as needed only if symptoms of hypoglycemia (dizziness, tremors, shaking) 50 each 12  . guaifenesin (ROBITUSSIN) 100 MG/5ML syrup Take 200 mg by mouth 3 (three) times daily as needed for cough.    . isosorbide mononitrate (IMDUR) 30 MG 24 hr tablet TAKE 1 TABLET BY MOUTH ONCE DAILY FOR HEART. DO NOT CRUSH. 90 tablet 3  . loperamide (IMODIUM) 2 MG capsule Take 2 mg by mouth as needed for diarrhea or loose stools.    . magnesium hydroxide (MILK OF MAGNESIA) 400 MG/5ML suspension Take 15 mLs by mouth daily as needed for mild constipation.    . Melatonin 10 MG TABS Take by mouth at bedtime as needed.    . neomycin-bacitracin-polymyxin (NEOSPORIN) [phone] ointment Apply topically as needed.     No current facility-administered medications for this visit.     Allergies as of 07/13/2018  . (No Known Allergies)    ROS:  General: Negative for anorexia, weight loss, fever, chills, fatigue, weakness. ENT: Negative for hoarseness, difficulty swallowing , nasal congestion. CV: Negative for chest pain, angina, palpitations, dyspnea on exertion, peripheral edema.  Respiratory: Negative for dyspnea at rest, dyspnea on exertion, cough, sputum, wheezing.  GI: See history of present illness. GU:  Negative for dysuria, hematuria, urinary incontinence, urinary frequency, nocturnal urination.  Endo: Negative for unusual weight change.    Physical Examination:   There were no vitals taken for this visit.  General: Well-nourished, well-developed in no acute distress.  Eyes: No icterus. Conjunctivae pink. Mouth: Oropharyngeal mucosa moist and pink , no lesions erythema or exudate. Lungs: Clear to auscultation bilaterally. Non-labored. Heart: Regular rate and rhythm, no murmurs rubs or gallops.  Abdomen: Bowel sounds are normal, nontender, nondistended, no hepatosplenomegaly or masses, no abdominal bruits or hernia , no rebound or guarding.   Extremities: No lower  extremity edema. No clubbing or deformities. Neuro: Alert and oriented x 2.  Grossly intact. Skin: Warm and dry, no jaundice.   Psych: Alert and cooperative, normal mood and affect.   Imaging Studies: No results found.  Assessment and Plan:   Kyle Short is a 72 y.o. y/o male for chronic hepatitis C. GT 1a, treatment naive . He has advanced CKD. Await elastography to determine if he has cirrhosis. Awaiting elastography to determine level of liver fibrosis(ordered last visit but not done)  and then  can plan to treat with Daily fixed-dose combination of glecaprevir (300 mg)/pibrentasvir (120 mg) for 8 to 16 weeks. He also needs Hep A/B vaccine with Kyle Hauser, DO A copy of the note will be given to the care provider who is with the patient.    Dr Jonathon Bellows  MD,MRCP [facility]) Follow up in 8 weeks

## 2018-07-14 DIAGNOSIS — F411 Generalized anxiety disorder: Secondary | ICD-10-CM | POA: Diagnosis not present

## 2018-07-14 DIAGNOSIS — F322 Major depressive disorder, single episode, severe without psychotic features: Secondary | ICD-10-CM | POA: Diagnosis not present

## 2018-07-15 ENCOUNTER — Other Ambulatory Visit: Payer: Self-pay

## 2018-07-18 ENCOUNTER — Other Ambulatory Visit: Payer: Self-pay

## 2018-07-21 ENCOUNTER — Ambulatory Visit: Payer: Medicare Other

## 2018-07-21 DIAGNOSIS — F411 Generalized anxiety disorder: Secondary | ICD-10-CM | POA: Diagnosis not present

## 2018-07-21 DIAGNOSIS — F322 Major depressive disorder, single episode, severe without psychotic features: Secondary | ICD-10-CM | POA: Diagnosis not present

## 2018-07-27 ENCOUNTER — Ambulatory Visit
Admission: RE | Admit: 2018-07-27 | Discharge: 2018-07-27 | Disposition: A | Discharge status: Home / Self Care | Payer: Medicare Other | Source: Ambulatory Visit | Attending: Gastroenterology | Admitting: Gastroenterology

## 2018-07-27 DIAGNOSIS — B1920 Unspecified viral hepatitis C without hepatic coma: Secondary | ICD-10-CM | POA: Insufficient documentation

## 2018-07-27 DIAGNOSIS — B182 Chronic viral hepatitis C: Secondary | ICD-10-CM | POA: Diagnosis not present

## 2018-07-27 DIAGNOSIS — K8020 Calculus of gallbladder without cholecystitis without obstruction: Secondary | ICD-10-CM | POA: Insufficient documentation

## 2018-07-28 DIAGNOSIS — F411 Generalized anxiety disorder: Secondary | ICD-10-CM | POA: Diagnosis not present

## 2018-07-28 DIAGNOSIS — F322 Major depressive disorder, single episode, severe without psychotic features: Secondary | ICD-10-CM | POA: Diagnosis not present

## 2018-08-04 DIAGNOSIS — F322 Major depressive disorder, single episode, severe without psychotic features: Secondary | ICD-10-CM | POA: Diagnosis not present

## 2018-08-04 DIAGNOSIS — F411 Generalized anxiety disorder: Secondary | ICD-10-CM | POA: Diagnosis not present

## 2018-08-11 DIAGNOSIS — F322 Major depressive disorder, single episode, severe without psychotic features: Secondary | ICD-10-CM | POA: Diagnosis not present

## 2018-08-11 DIAGNOSIS — F411 Generalized anxiety disorder: Secondary | ICD-10-CM | POA: Diagnosis not present

## 2018-08-17 DIAGNOSIS — E1122 Type 2 diabetes mellitus with diabetic chronic kidney disease: Secondary | ICD-10-CM | POA: Diagnosis not present

## 2018-08-17 DIAGNOSIS — N2581 Secondary hyperparathyroidism of renal origin: Secondary | ICD-10-CM | POA: Diagnosis not present

## 2018-08-17 DIAGNOSIS — I10 Essential (primary) hypertension: Secondary | ICD-10-CM | POA: Diagnosis not present

## 2018-08-17 DIAGNOSIS — N184 Chronic kidney disease, stage 4 (severe): Secondary | ICD-10-CM | POA: Diagnosis not present

## 2018-08-18 DIAGNOSIS — F322 Major depressive disorder, single episode, severe without psychotic features: Secondary | ICD-10-CM | POA: Diagnosis not present

## 2018-08-18 DIAGNOSIS — F411 Generalized anxiety disorder: Secondary | ICD-10-CM | POA: Diagnosis not present

## 2018-08-25 DIAGNOSIS — F411 Generalized anxiety disorder: Secondary | ICD-10-CM | POA: Diagnosis not present

## 2018-08-25 DIAGNOSIS — F322 Major depressive disorder, single episode, severe without psychotic features: Secondary | ICD-10-CM | POA: Diagnosis not present

## 2018-08-31 ENCOUNTER — Ambulatory Visit: Payer: Medicare Other | Admitting: Gastroenterology

## 2018-08-31 ENCOUNTER — Encounter: Payer: Self-pay | Admitting: Gastroenterology

## 2018-08-31 DIAGNOSIS — B1920 Unspecified viral hepatitis C without hepatic coma: Secondary | ICD-10-CM

## 2018-09-01 DIAGNOSIS — F411 Generalized anxiety disorder: Secondary | ICD-10-CM | POA: Diagnosis not present

## 2018-09-01 DIAGNOSIS — F322 Major depressive disorder, single episode, severe without psychotic features: Secondary | ICD-10-CM | POA: Diagnosis not present

## 2018-09-08 DIAGNOSIS — F411 Generalized anxiety disorder: Secondary | ICD-10-CM | POA: Diagnosis not present

## 2018-09-08 DIAGNOSIS — F322 Major depressive disorder, single episode, severe without psychotic features: Secondary | ICD-10-CM | POA: Diagnosis not present

## 2018-09-15 DIAGNOSIS — F322 Major depressive disorder, single episode, severe without psychotic features: Secondary | ICD-10-CM | POA: Diagnosis not present

## 2018-09-15 DIAGNOSIS — F411 Generalized anxiety disorder: Secondary | ICD-10-CM | POA: Diagnosis not present

## 2018-09-22 DIAGNOSIS — F411 Generalized anxiety disorder: Secondary | ICD-10-CM | POA: Diagnosis not present

## 2018-09-22 DIAGNOSIS — F322 Major depressive disorder, single episode, severe without psychotic features: Secondary | ICD-10-CM | POA: Diagnosis not present

## 2018-09-29 ENCOUNTER — Ambulatory Visit (INDEPENDENT_AMBULATORY_CARE_PROVIDER_SITE_OTHER): Payer: Medicare Other

## 2018-09-29 DIAGNOSIS — Z23 Encounter for immunization: Secondary | ICD-10-CM

## 2018-09-29 DIAGNOSIS — F411 Generalized anxiety disorder: Secondary | ICD-10-CM | POA: Diagnosis not present

## 2018-09-29 DIAGNOSIS — F322 Major depressive disorder, single episode, severe without psychotic features: Secondary | ICD-10-CM | POA: Diagnosis not present

## 2018-10-06 DIAGNOSIS — F411 Generalized anxiety disorder: Secondary | ICD-10-CM | POA: Diagnosis not present

## 2018-10-06 DIAGNOSIS — F322 Major depressive disorder, single episode, severe without psychotic features: Secondary | ICD-10-CM | POA: Diagnosis not present

## 2018-10-14 DIAGNOSIS — F322 Major depressive disorder, single episode, severe without psychotic features: Secondary | ICD-10-CM | POA: Diagnosis not present

## 2018-10-14 DIAGNOSIS — F411 Generalized anxiety disorder: Secondary | ICD-10-CM | POA: Diagnosis not present

## 2018-10-20 DIAGNOSIS — F411 Generalized anxiety disorder: Secondary | ICD-10-CM | POA: Diagnosis not present

## 2018-10-20 DIAGNOSIS — F322 Major depressive disorder, single episode, severe without psychotic features: Secondary | ICD-10-CM | POA: Diagnosis not present

## 2018-10-27 DIAGNOSIS — F322 Major depressive disorder, single episode, severe without psychotic features: Secondary | ICD-10-CM | POA: Diagnosis not present

## 2018-10-27 DIAGNOSIS — F411 Generalized anxiety disorder: Secondary | ICD-10-CM | POA: Diagnosis not present

## 2018-11-03 DIAGNOSIS — F411 Generalized anxiety disorder: Secondary | ICD-10-CM | POA: Diagnosis not present

## 2018-11-03 DIAGNOSIS — F322 Major depressive disorder, single episode, severe without psychotic features: Secondary | ICD-10-CM | POA: Diagnosis not present

## 2018-11-17 DIAGNOSIS — F322 Major depressive disorder, single episode, severe without psychotic features: Secondary | ICD-10-CM | POA: Diagnosis not present

## 2018-11-17 DIAGNOSIS — F411 Generalized anxiety disorder: Secondary | ICD-10-CM | POA: Diagnosis not present

## 2018-11-23 DIAGNOSIS — I110 Hypertensive heart disease with heart failure: Secondary | ICD-10-CM | POA: Diagnosis not present

## 2018-11-23 DIAGNOSIS — I509 Heart failure, unspecified: Secondary | ICD-10-CM | POA: Diagnosis not present

## 2018-11-23 DIAGNOSIS — E872 Acidosis: Secondary | ICD-10-CM | POA: Diagnosis not present

## 2018-11-23 DIAGNOSIS — N184 Chronic kidney disease, stage 4 (severe): Secondary | ICD-10-CM | POA: Diagnosis not present

## 2018-11-23 DIAGNOSIS — E1122 Type 2 diabetes mellitus with diabetic chronic kidney disease: Secondary | ICD-10-CM | POA: Diagnosis not present

## 2018-11-23 DIAGNOSIS — R809 Proteinuria, unspecified: Secondary | ICD-10-CM | POA: Diagnosis not present

## 2018-11-23 DIAGNOSIS — D631 Anemia in chronic kidney disease: Secondary | ICD-10-CM | POA: Diagnosis not present

## 2018-11-23 DIAGNOSIS — I10 Essential (primary) hypertension: Secondary | ICD-10-CM | POA: Diagnosis not present

## 2018-11-23 DIAGNOSIS — N2581 Secondary hyperparathyroidism of renal origin: Secondary | ICD-10-CM | POA: Diagnosis not present

## 2018-11-23 DIAGNOSIS — E785 Hyperlipidemia, unspecified: Secondary | ICD-10-CM | POA: Diagnosis not present

## 2018-11-23 DIAGNOSIS — E875 Hyperkalemia: Secondary | ICD-10-CM | POA: Diagnosis not present

## 2018-11-24 DIAGNOSIS — F322 Major depressive disorder, single episode, severe without psychotic features: Secondary | ICD-10-CM | POA: Diagnosis not present

## 2018-11-24 DIAGNOSIS — F411 Generalized anxiety disorder: Secondary | ICD-10-CM | POA: Diagnosis not present

## 2018-12-01 DIAGNOSIS — F411 Generalized anxiety disorder: Secondary | ICD-10-CM | POA: Diagnosis not present

## 2018-12-01 DIAGNOSIS — F322 Major depressive disorder, single episode, severe without psychotic features: Secondary | ICD-10-CM | POA: Diagnosis not present

## 2018-12-07 IMAGING — CR DG CHEST 2V
2 series · 2 of 2 positions shown · non-contrast
Comparison: Chest x-ray of August 06, 2017

CLINICAL DATA: Onset of chest pain at 10 a.m. today. Also
unwitnessed fall in the bathroom. History of CHF, previous MI,
previous CVA, dementia.

EXAM:
CHEST  2 VIEW

[chest lat]
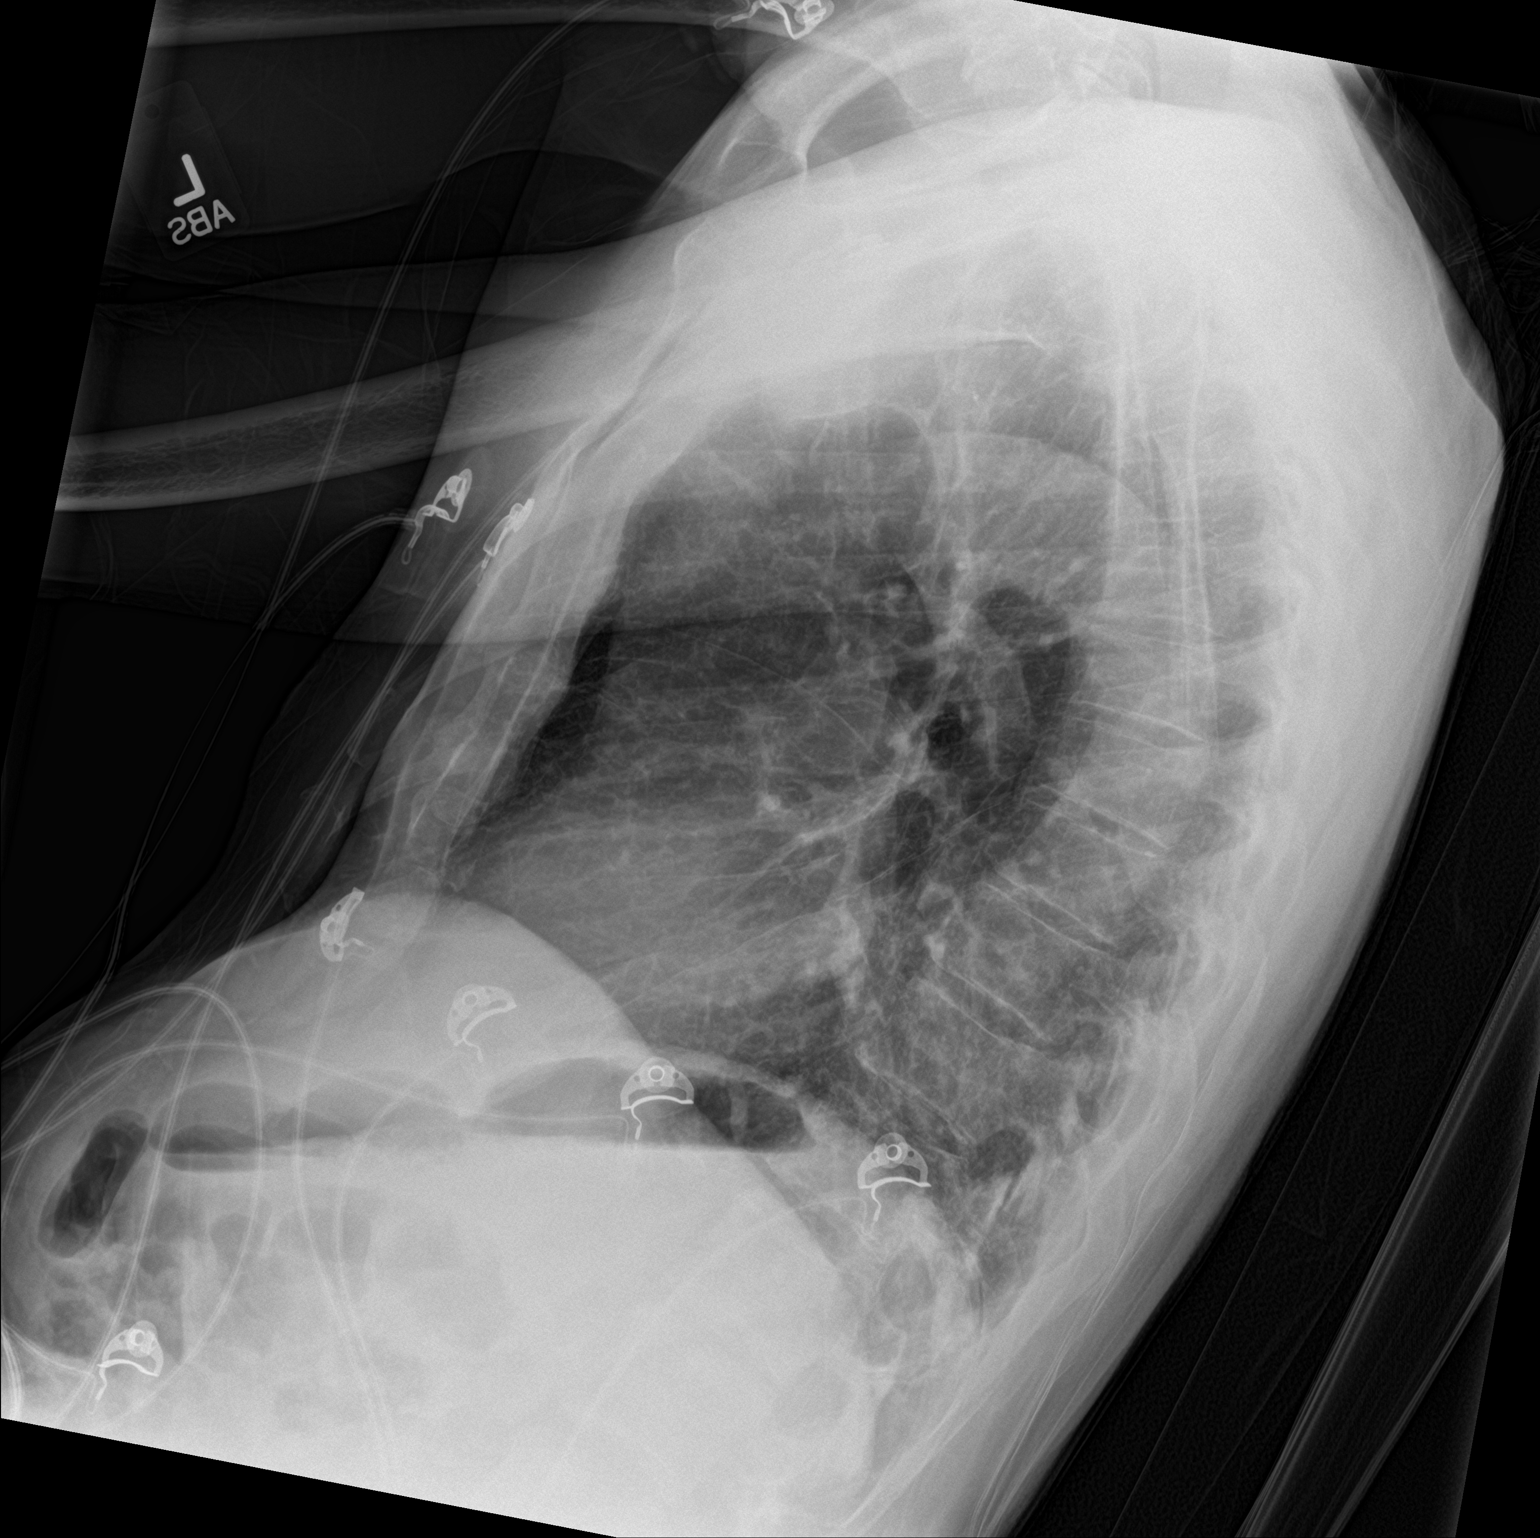

[chest ap]
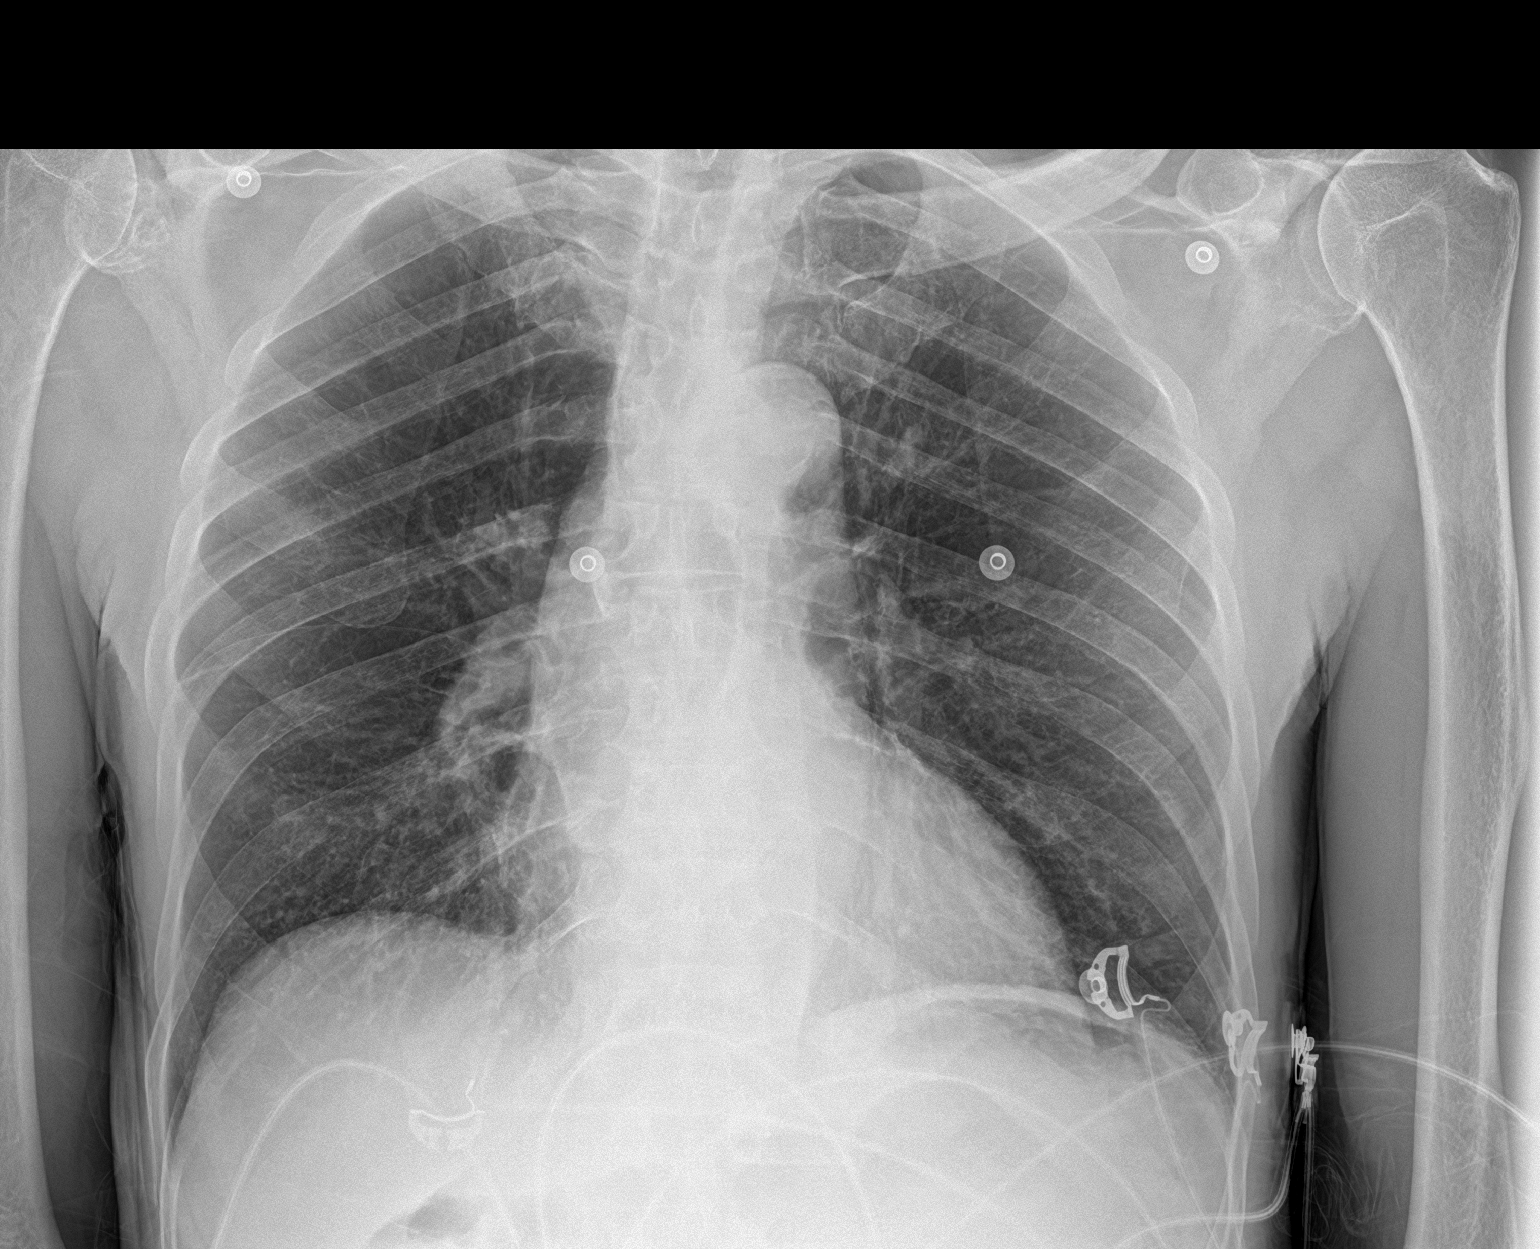

[2 of 2 positions shown; findings below may reference images not displayed]

FINDINGS: The lungs are adequately inflated. There is no focal infiltrate.
There is no pleural effusion. There calcification in the wall of the
aortic arch. The heart and pulmonary vascularity are normal. The
bony thorax exhibits no acute abnormality.
IMPRESSION: There is no active cardiopulmonary disease.

Thoracic aortic atherosclerosis.

## 2018-12-07 IMAGING — CT CT HEAD W/O CM
3 of 4 series · 16 of 47 positions shown, 19 images · non-contrast
Comparison: CT head dated July 06, 2015.

CLINICAL DATA: Unwitnessed fall.  Patient denies hitting head.

EXAM:
CT HEAD WITHOUT CONTRAST
TECHNIQUE: Contiguous axial images were obtained from the base of the skull
through the vertex without intravenous contrast.

[Series 2: head wo · axial · 0.44mm/px · z∈[+420,+550]mm · 10 of 31 slices shown, 13 images]
[im 3/31  brain]
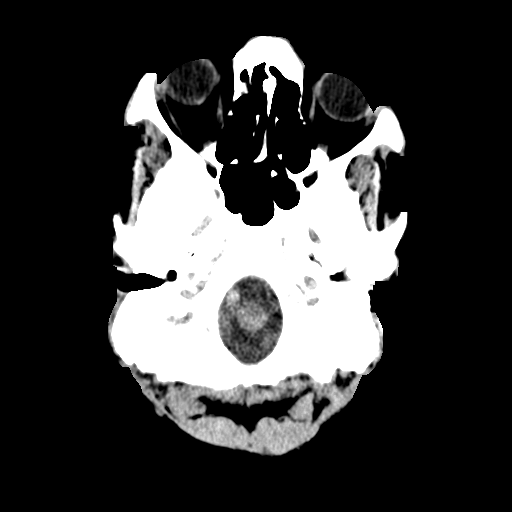
[im 3/31  bone]
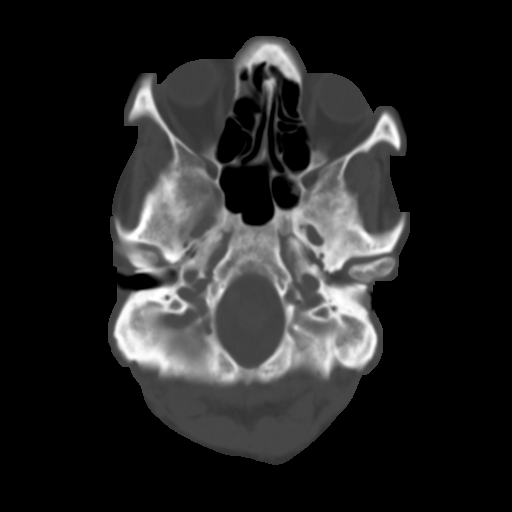
[im 7/31  brain]
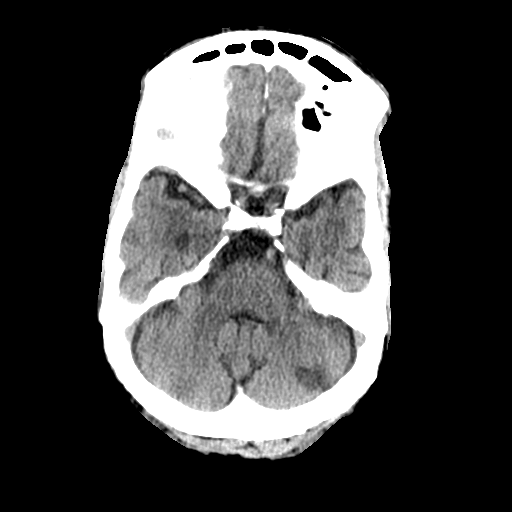
[im 9/31  brain]
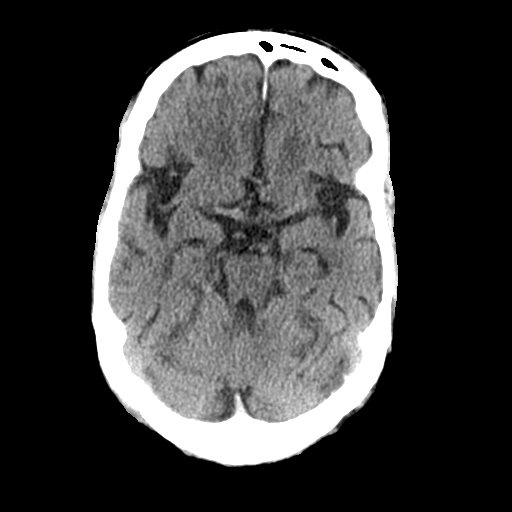
[im 11/31  brain]
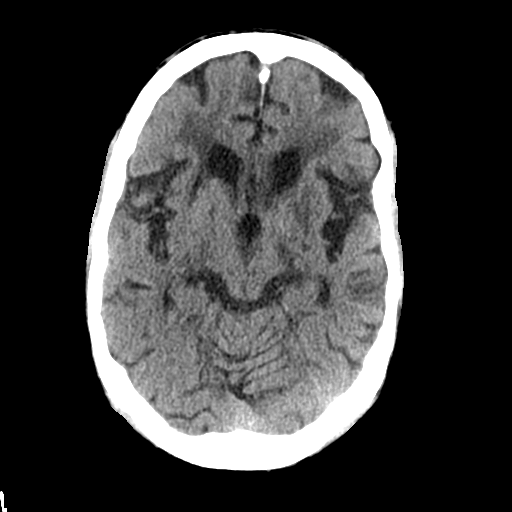
[im 15/31  brain]
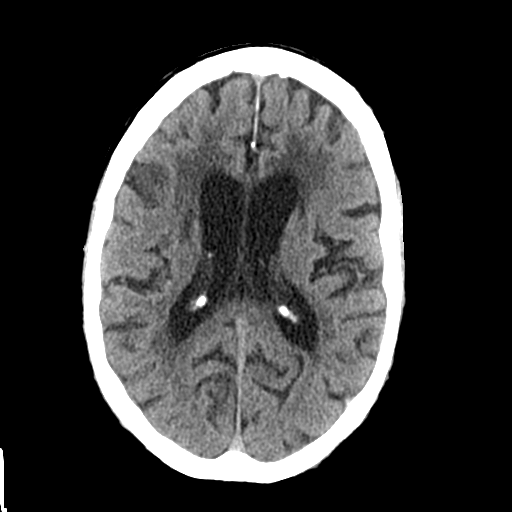
[im 15/31  bone]
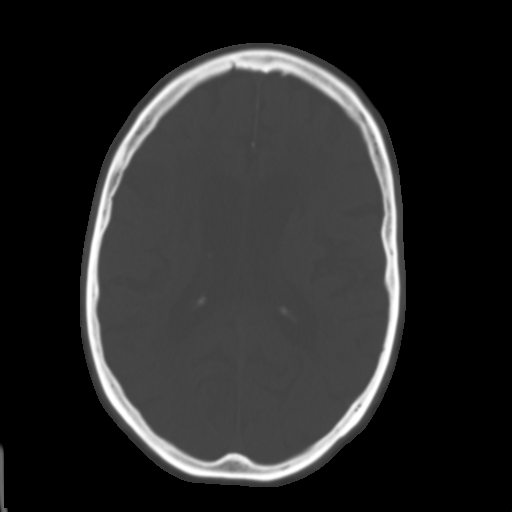
[im 17/31  brain]
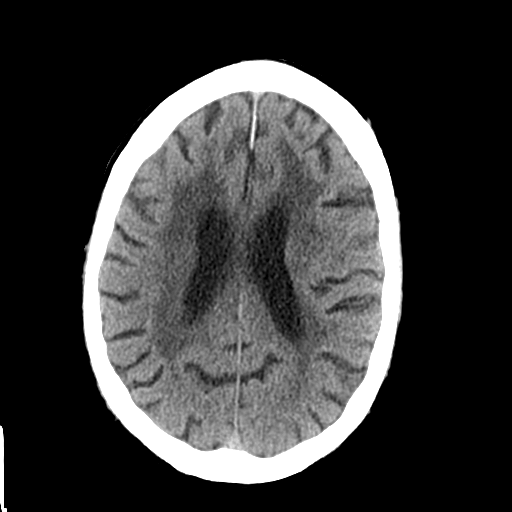
[im 21/31  brain]
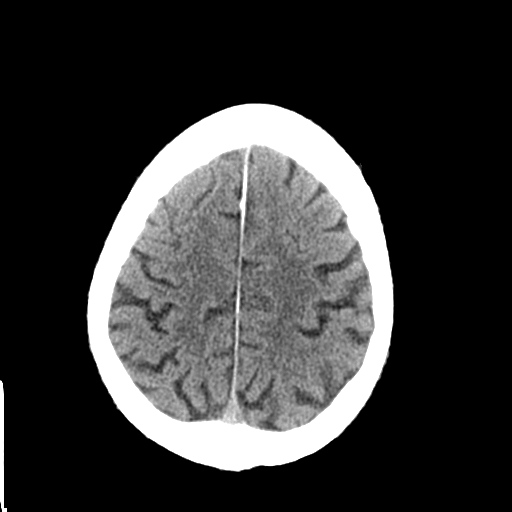
[im 23/31  brain]
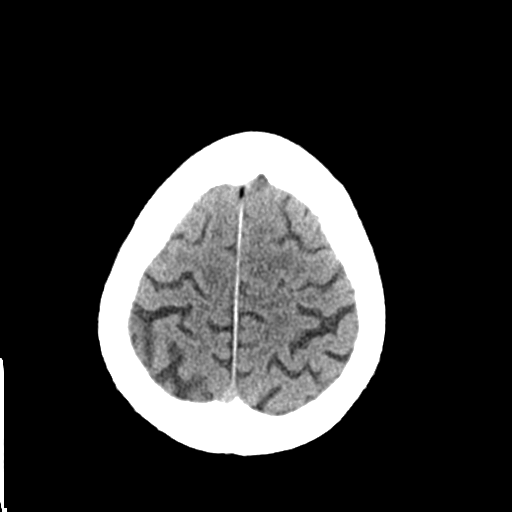
[im 25/31  brain]
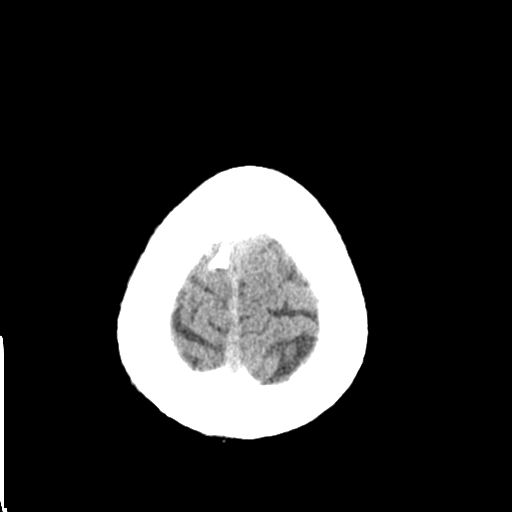
[im 25/31  bone]
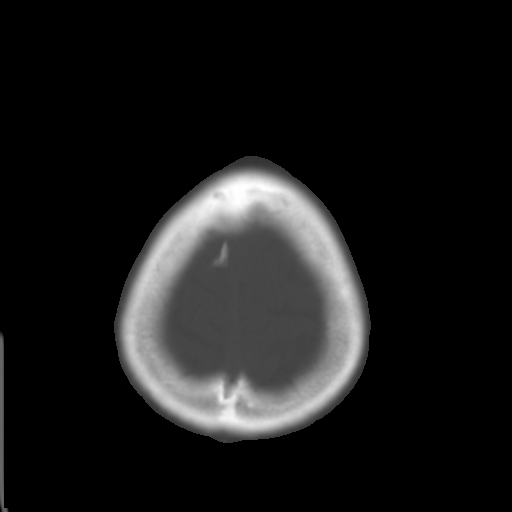
[im 29/31  brain]
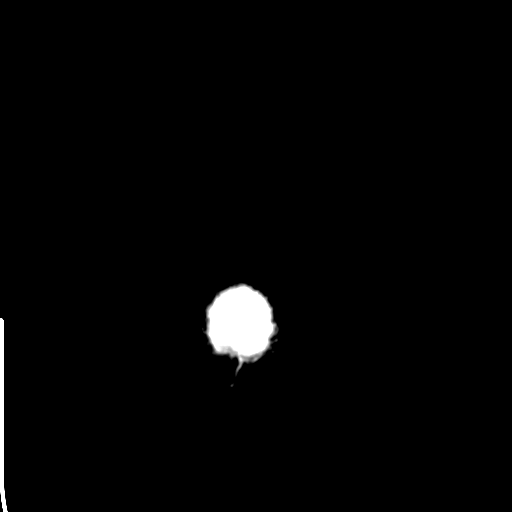

[Series 4: coronal soft tissue · coronal · 0.31mm/px · 3 of 62 slices shown]
[im 21/62  brain]
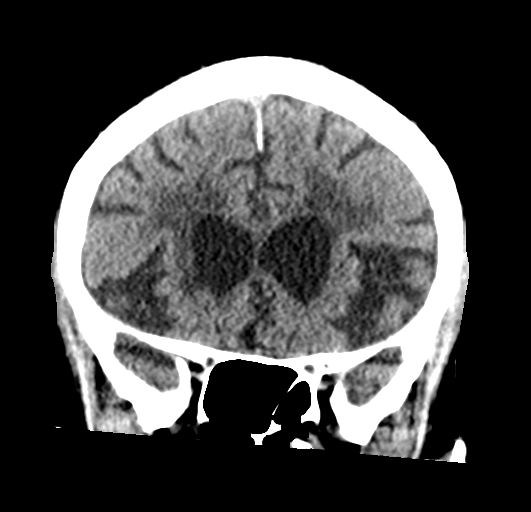
[im 28/62  brain]
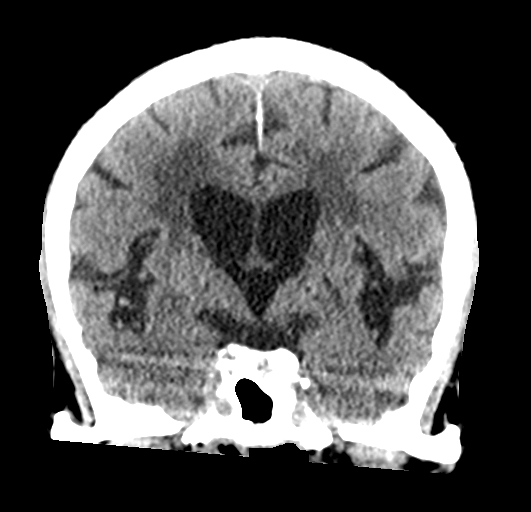
[im 34/62  brain]
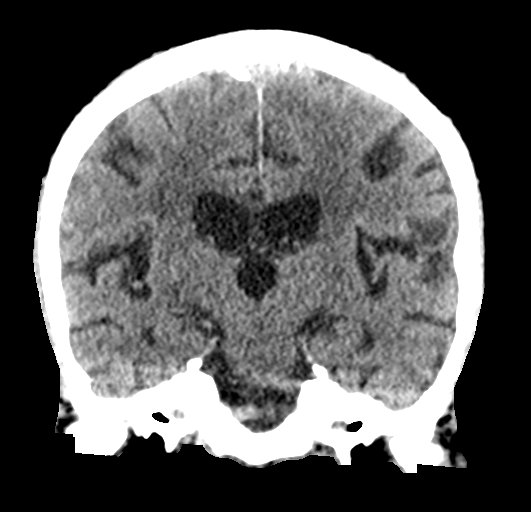

[Series 5: sagittal soft tissue · sagittal · 0.31mm/px · 3 of 50 slices shown]
[im 17/50  brain]
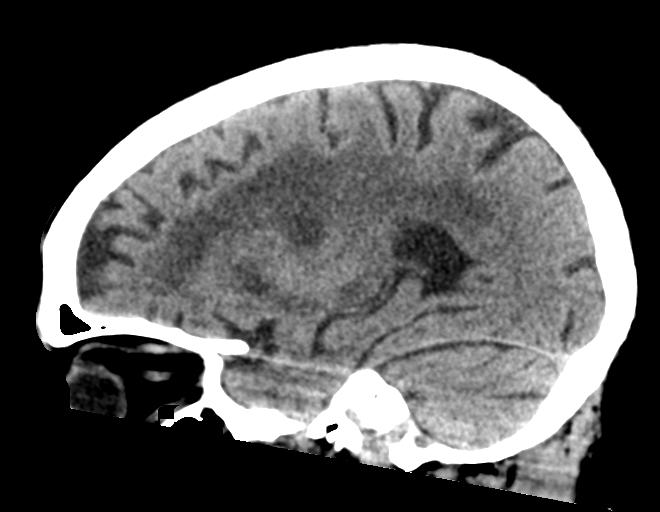
[im 25/50  brain]
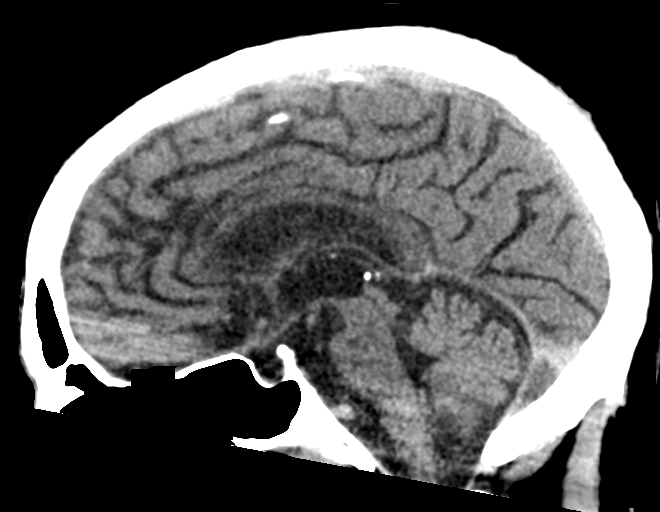
[im 33/50  brain]
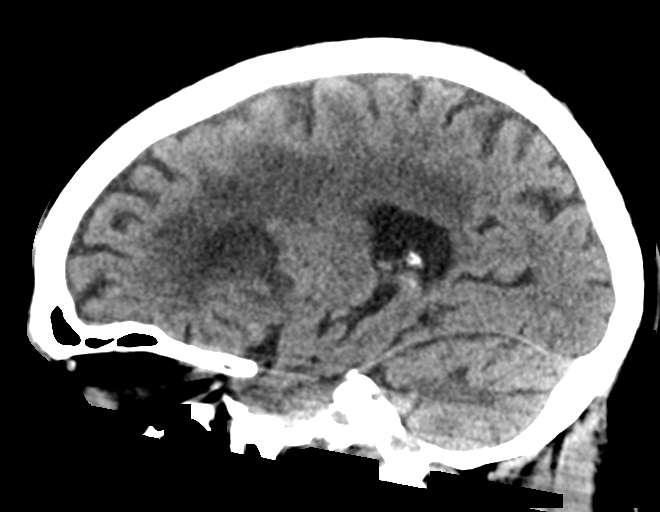

[16 of 47 positions shown; findings below may reference images not displayed]

FINDINGS: Brain: No evidence of acute infarction, hemorrhage, hydrocephalus,
extra-axial collection or mass lesion/mass effect. Stable mild
cerebral atrophy and extensive chronic microvascular ischemic white
matter disease. Bilateral basal ganglia and left cerebellar lacunar
infarcts are unchanged.

Vascular: Atherosclerotic calcifications of the carotid siphons. No
hyperdense vessel.

Skull: Normal. Negative for fracture or focal lesion.

Sinuses/Orbits: No acute finding.

Other: None.
IMPRESSION: 1.  No acute intracranial abnormality.
2. Stable mild cerebral atrophy and severe chronic microvascular
ischemic white matter disease.

## 2018-12-08 DIAGNOSIS — F322 Major depressive disorder, single episode, severe without psychotic features: Secondary | ICD-10-CM | POA: Diagnosis not present

## 2018-12-08 DIAGNOSIS — F411 Generalized anxiety disorder: Secondary | ICD-10-CM | POA: Diagnosis not present

## 2018-12-18 DIAGNOSIS — F411 Generalized anxiety disorder: Secondary | ICD-10-CM | POA: Diagnosis not present

## 2018-12-18 DIAGNOSIS — F322 Major depressive disorder, single episode, severe without psychotic features: Secondary | ICD-10-CM | POA: Diagnosis not present

## 2018-12-21 DIAGNOSIS — F322 Major depressive disorder, single episode, severe without psychotic features: Secondary | ICD-10-CM | POA: Diagnosis not present

## 2018-12-21 DIAGNOSIS — F411 Generalized anxiety disorder: Secondary | ICD-10-CM | POA: Diagnosis not present

## 2018-12-29 DIAGNOSIS — F322 Major depressive disorder, single episode, severe without psychotic features: Secondary | ICD-10-CM | POA: Diagnosis not present

## 2018-12-29 DIAGNOSIS — F411 Generalized anxiety disorder: Secondary | ICD-10-CM | POA: Diagnosis not present

## 2019-01-05 DIAGNOSIS — F411 Generalized anxiety disorder: Secondary | ICD-10-CM | POA: Diagnosis not present

## 2019-01-05 DIAGNOSIS — F322 Major depressive disorder, single episode, severe without psychotic features: Secondary | ICD-10-CM | POA: Diagnosis not present

## 2019-01-12 DIAGNOSIS — F322 Major depressive disorder, single episode, severe without psychotic features: Secondary | ICD-10-CM | POA: Diagnosis not present

## 2019-01-12 DIAGNOSIS — F411 Generalized anxiety disorder: Secondary | ICD-10-CM | POA: Diagnosis not present

## 2019-01-13 ENCOUNTER — Encounter: Payer: Self-pay | Admitting: Family Medicine

## 2019-01-13 ENCOUNTER — Ambulatory Visit (INDEPENDENT_AMBULATORY_CARE_PROVIDER_SITE_OTHER): Payer: Medicare Other | Admitting: Family Medicine

## 2019-01-13 VITALS — BP 131/75 | HR 70 | Temp 98.7°F | Resp 16 | Ht 73.0 in | Wt 143.6 lb

## 2019-01-13 DIAGNOSIS — M545 Low back pain, unspecified: Secondary | ICD-10-CM

## 2019-01-13 DIAGNOSIS — K5900 Constipation, unspecified: Secondary | ICD-10-CM

## 2019-01-13 DIAGNOSIS — J019 Acute sinusitis, unspecified: Secondary | ICD-10-CM

## 2019-01-13 DIAGNOSIS — E559 Vitamin D deficiency, unspecified: Secondary | ICD-10-CM | POA: Diagnosis not present

## 2019-01-13 DIAGNOSIS — B182 Chronic viral hepatitis C: Secondary | ICD-10-CM | POA: Diagnosis not present

## 2019-01-13 MED ORDER — IPRATROPIUM BROMIDE 0.06 % NA SOLN: 2.0000 | Freq: Four times a day (QID) | NASAL | 0 refills | Status: DC

## 2019-01-13 MED ORDER — ACETAMINOPHEN 500 MG PO TABS: 500.0000 mg | ORAL_TABLET | Freq: Three times a day (TID) | ORAL | 0 refills | Status: DC | PRN

## 2019-01-13 MED ORDER — DOCUSATE SODIUM 100 MG PO TABS: 100.0000 mg | ORAL_TABLET | Freq: Every day | ORAL | 5 refills | Status: DC | PRN

## 2019-01-13 NOTE — Patient Instructions (Addendum)
Thank you for coming to the office today.  For congestion  Start Atrovent nasal spray decongestant 2 sprays in each nostril up to 4 times daily for 7 days -----  For back pain Likely muscular if it comes and goes Try increasing Tylenol  Recommend to start taking Tylenol Extra Strength 500mg  tabs - take 1 to 2 tabs per dose (max 1000mg ) every 6-8 hours for pain (take regularly, don't skip a dose for next 7 days), max 24 hour daily dose is 6 tablets or 3000mg . In the future you can repeat the same everyday Tylenol course for 1-2 weeks at a time.   DISCONTINUE Vitamin D3 2,000 daily for now since he is not taking.  No sign of urinary tract infection. Notify if need to check it.  Follow-up with [facility] for Hep C treatment  -----  For Constipation (less frequent bowel movement that can be hard dry or involve straining).  Start Docusate generic 100mg  take 1-2 tablets daily as needed for constipation  In future can try OTC Miralax 17g = 1 capful in large glass water once daily for now, try several days to see if working, goal is soft stool or BM 1-2 times daily, if too loose then reduce dose or try every other day. If not effective may need to increase it to 2 doses at once in AM or may do 1 in morning and 1 in afternoon/evening  - This medicine is very safe and can be used often without any problem and will not make you dehydrated. It is good for use on AS NEEDED BASIS or even MAINTENANCE therapy for longer term for several days to weeks at a time to help regulate bowel movements  Other more natural remedies or preventative treatment: - Increase hydration with water - Increase fiber in diet (high fiber foods = vegetables, leafy greens, oats/grains) - May take OTC Fiber supplement (metamucil powder or pill/gummy) - May try OTC Probiotic    DUE for FASTING BLOOD WORK (no food or drink after midnight before the lab appointment, only water or coffee without cream/sugar on the morning  of)  SCHEDULE "Lab Only" visit in the morning at the clinic for lab draw in 6 MONTHS   - Make sure Lab Only appointment is at about 1 week before your next appointment, so that results will be available  For Lab Results, once available within 2-3 days of blood draw, you can can log in to MyChart online to view your results and a brief explanation. Also, we can discuss results at next follow-up visit.   Please schedule a Follow-up Appointment to: Return in about 6 months (around 07/14/2019) for Yearly Medicare Checkup.  If you have any other questions or concerns, please feel free to call the office or send a message through [place]. You may also schedule an earlier appointment if necessary.  Additionally, you may be receiving a survey about your experience at our office within a few days to 1 week by e-mail or mail. We value your feedback.  Nobie Putnam, DO [place]

## 2019-01-13 NOTE — Progress Notes (Signed)
Subjective:    Patient ID: Kyle Short, male    DOB: 1946-09-04, 73 y.o.   MRN: [id number]  Kyle Short is a 73 y.o. male presenting on 01/13/2019 for Back Pain (left side onst 4 days ) and Nasal Congestion (onset 4 days, cough)  Patient accompanied by Kyle Short (contact, [phone], owner of care home) [facility] Kyle.She provides majority of history for patient. Also Kyle Short is available today for additional history.  HPI   Low Back Pain / Left side  Reports recent new problem some soreness in L sided back with pain, positional worse if prolong seated and turn. He has had before, did not do well with baclofen muscle relaxant. Taking tylenol 325mg  1-2 times daily PRN with good results. Asking if he can take more. Denies dysuria, urinary frequency, hematuria, confusion, fever chills, nausea vomiting, numb or tingling  Constipation Reported he has some harder dry bowel movements, not every day, not using stool softener currently Denies abdominal pain, dark stool or blood  Also admits recent - Rhinosinusitis, nasal congestion  Completed Harvoni Treatment - Will follow-up with Kyle Short, awaiting test of cure   Depression screen [facility] 2/9 01/13/2019 07/12/2018 07/12/2018  Decreased Interest 0 0 0  Down, Depressed, Hopeless 0 0 0  PHQ - 2 Score 0 0 0    Social History   Tobacco Use  . Smoking status: Current Some Day Smoker    Packs/day: 1.00    Types: Cigarettes  . Smokeless tobacco: Current User  Substance Use Topics  . Alcohol use: Not Currently    Alcohol/week: 10.0 standard drinks    Types: 10 Cans of beer per week    Frequency: Never  . Drug use: No    Review of Systems Per HPI unless specifically indicated above     Objective:    BP 131/75   Pulse 70   Temp 98.7 F (37.1 C) (Oral)   Resp 16   Ht 6\' 1"  (1.854 m)   Wt 143 lb 9.6 oz (65.1 kg)   BMI 18.95 kg/m   Wt Readings from Last 3 Encounters:  01/13/19 143 lb 9.6 oz (65.1  kg)  07/13/18 145 lb 3.2 oz (65.9 kg)  07/12/18 145 lb 12.8 oz (66.1 kg)    Physical Exam Vitals signs and nursing note reviewed.  Constitutional:      General: He is not in acute distress.    Appearance: He is well-developed. He is not diaphoretic.     Comments: Elderly appearing male, currently well-appearing, comfortable, cooperative.  HENT:     Head: Normocephalic and atraumatic.  Eyes:     Conjunctiva/sclera: Conjunctivae normal.     Pupils: Pupils are equal, round, and reactive to light.  Neck:     Musculoskeletal: Normal range of motion.  Cardiovascular:     Rate and Rhythm: Normal rate and regular rhythm.     Heart sounds: Normal heart sounds. No murmur.  Pulmonary:     Effort: Pulmonary effort is normal. No respiratory distress.     Breath sounds: Normal breath sounds. No wheezing or rales.  Abdominal:     General: Bowel sounds are normal. There is no distension.     Palpations: Abdomen is soft.     Tenderness: There is no abdominal tenderness.  Musculoskeletal: Normal range of motion.     Comments: Localized mild tender Left sided lower thoracic upper lumbar paraspinal muscles, has some muscular atrophy present generalized. Reproduced tender on palpation.  Skin:    General: Skin is warm and dry.     Findings: No rash.  Neurological:     Mental Status: He is alert.     Coordination: Coordination normal.  Psychiatric:        Behavior: Behavior normal.     Comments: Limited verbal during history, speech is appropriate but sparse, today he is more verbal. Participates in exam and follows commands well. Limited insight into condition.    Results for orders placed or performed during the hospital encounter of 06/01/18  HIV antibody  Result Value Ref Range   HIV Screen 4th Generation wRfx Non Reactive Non Reactive  Hepatitis B surface antibody  Result Value Ref Range   Hepatitis B-Post <3.1 (L) Immunity>9.9 mIU/mL  Hepatitis A antibody, total  Result Value Ref Range    Hep A Total Ab Negative Negative  Hepatitis B core antibody, total  Result Value Ref Range   Hep B Core Total Ab Negative Negative  Hepatitis B surface antigen  Result Value Ref Range   Hepatitis B Surface Ag Negative Negative  Protime-INR  Result Value Ref Range   Prothrombin Time 12.1 11.4 - 15.2 seconds   INR 0.90   Comprehensive metabolic panel  Result Value Ref Range   Sodium 139 135 - 145 mmol/L   Potassium 3.9 3.5 - 5.1 mmol/L   Chloride 108 101 - 111 mmol/L   CO2 24 22 - 32 mmol/L   Glucose, Bld 85 65 - 99 mg/dL   BUN 35 (H) 6 - 20 mg/dL   Creatinine, Ser 2.54 (H) 0.61 - 1.24 mg/dL   Calcium 9.1 8.9 - 10.3 mg/dL   Total Protein 8.0 6.5 - 8.1 g/dL   Albumin 3.5 3.5 - 5.0 g/dL   AST 24 15 - 41 U/L   ALT 16 (L) 17 - 63 U/L   Alkaline Phosphatase 70 38 - 126 U/L   Total Bilirubin 0.6 0.3 - 1.2 mg/dL   GFR calc non Af Amer 24 (L) >60 mL/min   GFR calc Af Amer 28 (L) >60 mL/min   Anion gap 7 5 - 15  Hepatitis C genotype  Result Value Ref Range   HCV Genotype 1a    Please Note (HCV): Comment       Assessment & Plan:   Problem List Items Addressed This Visit    Hepatitis C, chronic (HCC) - Primary S/p treatment Harvoni Follow-up with [facility] for test of cure     Other Visit Diagnoses    Acute rhinosinusitis       Relevant Medications   ipratropium (ATROVENT) 0.06 % nasal spray   Vitamin D deficiency     Refuses to take Vitamin D capsule Hold for now DC order    Acute left-sided low back pain without sciatica    Secondary to MSK related symptoms, reproducible No sign of UTI. Question if possible constipation related.  Treat with increased Tylenol 500mg  1-2 per dose TID PRN Also treat constipation Follow-up if not improve     Relevant Medications   acetaminophen (TYLENOL) 500 MG tablet   Constipation, unspecified constipation type       Relevant Medications   Docusate Sodium 100 MG capsule      Meds ordered this encounter  Medications  . ipratropium  (ATROVENT) 0.06 % nasal spray    Sig: Place 2 sprays into both nostrils 4 (four) times daily. For up to 5-7 days then stop.    Dispense:  15 mL  Refill:  0  . acetaminophen (TYLENOL) 500 MG tablet    Sig: Take 1-2 tablets (500-1,000 mg total) by mouth every 8 (eight) hours as needed for moderate pain.    Dispense:  90 tablet    Refill:  0  . Docusate Sodium 100 MG capsule    Sig: Take 1-2 tablets (100-200 mg total) by mouth daily as needed for constipation.    Dispense:  30 tablet    Refill:  5      Follow up plan: Return in about 6 months (around 07/14/2019) for Yearly Medicare Checkup.  Future labs ordered for 6 months  Nobie Putnam, [facility] 01/13/2019, 9:38 AM

## 2019-01-19 DIAGNOSIS — F322 Major depressive disorder, single episode, severe without psychotic features: Secondary | ICD-10-CM | POA: Diagnosis not present

## 2019-01-19 DIAGNOSIS — F411 Generalized anxiety disorder: Secondary | ICD-10-CM | POA: Diagnosis not present

## 2019-01-23 ENCOUNTER — Other Ambulatory Visit: Payer: Self-pay | Admitting: Family Medicine

## 2019-01-23 DIAGNOSIS — I10 Essential (primary) hypertension: Secondary | ICD-10-CM

## 2019-01-26 DIAGNOSIS — F411 Generalized anxiety disorder: Secondary | ICD-10-CM | POA: Diagnosis not present

## 2019-01-26 DIAGNOSIS — F322 Major depressive disorder, single episode, severe without psychotic features: Secondary | ICD-10-CM | POA: Diagnosis not present

## 2019-01-31 ENCOUNTER — Other Ambulatory Visit: Payer: Self-pay | Admitting: Family Medicine

## 2019-01-31 DIAGNOSIS — G308 Other Alzheimer's disease: Principal | ICD-10-CM

## 2019-01-31 DIAGNOSIS — I6930 Unspecified sequelae of cerebral infarction: Secondary | ICD-10-CM

## 2019-01-31 DIAGNOSIS — R351 Nocturia: Secondary | ICD-10-CM

## 2019-01-31 DIAGNOSIS — N184 Chronic kidney disease, stage 4 (severe): Secondary | ICD-10-CM

## 2019-01-31 DIAGNOSIS — E1122 Type 2 diabetes mellitus with diabetic chronic kidney disease: Secondary | ICD-10-CM

## 2019-01-31 DIAGNOSIS — F0281 Dementia in other diseases classified elsewhere with behavioral disturbance: Secondary | ICD-10-CM

## 2019-01-31 DIAGNOSIS — F02818 Dementia in other diseases classified elsewhere, unspecified severity, with other behavioral disturbance: Secondary | ICD-10-CM

## 2019-01-31 DIAGNOSIS — I10 Essential (primary) hypertension: Secondary | ICD-10-CM

## 2019-01-31 DIAGNOSIS — E782 Mixed hyperlipidemia: Secondary | ICD-10-CM

## 2019-02-02 DIAGNOSIS — F411 Generalized anxiety disorder: Secondary | ICD-10-CM | POA: Diagnosis not present

## 2019-02-02 DIAGNOSIS — F322 Major depressive disorder, single episode, severe without psychotic features: Secondary | ICD-10-CM | POA: Diagnosis not present

## 2019-02-09 DIAGNOSIS — F411 Generalized anxiety disorder: Secondary | ICD-10-CM | POA: Diagnosis not present

## 2019-02-09 DIAGNOSIS — F322 Major depressive disorder, single episode, severe without psychotic features: Secondary | ICD-10-CM | POA: Diagnosis not present

## 2019-02-16 DIAGNOSIS — F411 Generalized anxiety disorder: Secondary | ICD-10-CM | POA: Diagnosis not present

## 2019-02-16 DIAGNOSIS — F322 Major depressive disorder, single episode, severe without psychotic features: Secondary | ICD-10-CM | POA: Diagnosis not present

## 2019-02-23 ENCOUNTER — Other Ambulatory Visit: Payer: Self-pay | Admitting: Family Medicine

## 2019-02-23 DIAGNOSIS — J019 Acute sinusitis, unspecified: Secondary | ICD-10-CM

## 2019-02-23 DIAGNOSIS — F411 Generalized anxiety disorder: Secondary | ICD-10-CM | POA: Diagnosis not present

## 2019-02-23 DIAGNOSIS — F322 Major depressive disorder, single episode, severe without psychotic features: Secondary | ICD-10-CM | POA: Diagnosis not present

## 2019-03-02 DIAGNOSIS — E785 Hyperlipidemia, unspecified: Secondary | ICD-10-CM | POA: Diagnosis not present

## 2019-03-02 DIAGNOSIS — E875 Hyperkalemia: Secondary | ICD-10-CM | POA: Diagnosis not present

## 2019-03-02 DIAGNOSIS — N2581 Secondary hyperparathyroidism of renal origin: Secondary | ICD-10-CM | POA: Diagnosis not present

## 2019-03-02 DIAGNOSIS — I110 Hypertensive heart disease with heart failure: Secondary | ICD-10-CM | POA: Diagnosis not present

## 2019-03-02 DIAGNOSIS — I509 Heart failure, unspecified: Secondary | ICD-10-CM | POA: Diagnosis not present

## 2019-03-02 DIAGNOSIS — E872 Acidosis: Secondary | ICD-10-CM | POA: Diagnosis not present

## 2019-03-02 DIAGNOSIS — F411 Generalized anxiety disorder: Secondary | ICD-10-CM | POA: Diagnosis not present

## 2019-03-02 DIAGNOSIS — D631 Anemia in chronic kidney disease: Secondary | ICD-10-CM | POA: Diagnosis not present

## 2019-03-02 DIAGNOSIS — E1122 Type 2 diabetes mellitus with diabetic chronic kidney disease: Secondary | ICD-10-CM | POA: Diagnosis not present

## 2019-03-02 DIAGNOSIS — R809 Proteinuria, unspecified: Secondary | ICD-10-CM | POA: Diagnosis not present

## 2019-03-02 DIAGNOSIS — F322 Major depressive disorder, single episode, severe without psychotic features: Secondary | ICD-10-CM | POA: Diagnosis not present

## 2019-03-02 DIAGNOSIS — I10 Essential (primary) hypertension: Secondary | ICD-10-CM | POA: Diagnosis not present

## 2019-03-02 DIAGNOSIS — N184 Chronic kidney disease, stage 4 (severe): Secondary | ICD-10-CM | POA: Diagnosis not present

## 2019-03-09 DIAGNOSIS — F322 Major depressive disorder, single episode, severe without psychotic features: Secondary | ICD-10-CM | POA: Diagnosis not present

## 2019-03-09 DIAGNOSIS — F411 Generalized anxiety disorder: Secondary | ICD-10-CM | POA: Diagnosis not present

## 2019-03-16 DIAGNOSIS — F322 Major depressive disorder, single episode, severe without psychotic features: Secondary | ICD-10-CM | POA: Diagnosis not present

## 2019-03-16 DIAGNOSIS — F411 Generalized anxiety disorder: Secondary | ICD-10-CM | POA: Diagnosis not present

## 2019-03-23 DIAGNOSIS — F322 Major depressive disorder, single episode, severe without psychotic features: Secondary | ICD-10-CM | POA: Diagnosis not present

## 2019-03-23 DIAGNOSIS — F411 Generalized anxiety disorder: Secondary | ICD-10-CM | POA: Diagnosis not present

## 2019-03-28 ENCOUNTER — Other Ambulatory Visit: Payer: Self-pay | Admitting: Nurse Practitioner

## 2019-03-28 ENCOUNTER — Other Ambulatory Visit: Payer: Self-pay | Admitting: Family Medicine

## 2019-03-28 DIAGNOSIS — F02818 Dementia in other diseases classified elsewhere, unspecified severity, with other behavioral disturbance: Secondary | ICD-10-CM

## 2019-03-28 DIAGNOSIS — I10 Essential (primary) hypertension: Secondary | ICD-10-CM

## 2019-03-28 DIAGNOSIS — G308 Other Alzheimer's disease: Principal | ICD-10-CM

## 2019-03-28 DIAGNOSIS — F0281 Dementia in other diseases classified elsewhere with behavioral disturbance: Secondary | ICD-10-CM

## 2019-03-30 DIAGNOSIS — F322 Major depressive disorder, single episode, severe without psychotic features: Secondary | ICD-10-CM | POA: Diagnosis not present

## 2019-03-30 DIAGNOSIS — F411 Generalized anxiety disorder: Secondary | ICD-10-CM | POA: Diagnosis not present

## 2019-04-06 DIAGNOSIS — F411 Generalized anxiety disorder: Secondary | ICD-10-CM | POA: Diagnosis not present

## 2019-04-06 DIAGNOSIS — F322 Major depressive disorder, single episode, severe without psychotic features: Secondary | ICD-10-CM | POA: Diagnosis not present

## 2019-04-13 DIAGNOSIS — F411 Generalized anxiety disorder: Secondary | ICD-10-CM | POA: Diagnosis not present

## 2019-04-13 DIAGNOSIS — F322 Major depressive disorder, single episode, severe without psychotic features: Secondary | ICD-10-CM | POA: Diagnosis not present

## 2019-04-20 DIAGNOSIS — F411 Generalized anxiety disorder: Secondary | ICD-10-CM | POA: Diagnosis not present

## 2019-04-20 DIAGNOSIS — F322 Major depressive disorder, single episode, severe without psychotic features: Secondary | ICD-10-CM | POA: Diagnosis not present

## 2019-04-21 ENCOUNTER — Encounter: Payer: Self-pay | Admitting: Family Medicine

## 2019-04-21 ENCOUNTER — Other Ambulatory Visit: Payer: Self-pay

## 2019-04-21 ENCOUNTER — Ambulatory Visit (INDEPENDENT_AMBULATORY_CARE_PROVIDER_SITE_OTHER): Payer: Medicare Other | Admitting: Family Medicine

## 2019-04-21 VITALS — BP 135/84 | HR 72

## 2019-04-21 DIAGNOSIS — N184 Chronic kidney disease, stage 4 (severe): Secondary | ICD-10-CM

## 2019-04-21 DIAGNOSIS — I129 Hypertensive chronic kidney disease with stage 1 through stage 4 chronic kidney disease, or unspecified chronic kidney disease: Secondary | ICD-10-CM | POA: Diagnosis not present

## 2019-04-21 DIAGNOSIS — M79604 Pain in right leg: Secondary | ICD-10-CM | POA: Diagnosis not present

## 2019-04-21 DIAGNOSIS — M79605 Pain in left leg: Secondary | ICD-10-CM

## 2019-04-21 NOTE — Assessment & Plan Note (Signed)
Overall controlled, some mild elevations - he was actually still taking Hydralazine, by report - difficulty swallowing it, despite it being DC'd previously Complication CKD-IV, Hypotension - Followed by Cardiology, Nephrology OFF Lisinopril  1. - DISCONTINUE Hydralazine again, sent DC order by fax to caregiver  - Continue - Carvedilol 6.25mg  BID 2. Encouraged improve hydration, caution sudden standing / change of position, fall precaution, some postural dizziness may occur 3. Monitor BP still - now reduce freq of BP checks from BID to DAILY - will reduce in future if still controlled 4. Follow-up 3 months

## 2019-04-21 NOTE — Progress Notes (Signed)
Virtual Visit via Telephone The purpose of this virtual visit is to provide medical care while limiting exposure to the novel coronavirus (COVID19) for both patient and office staff.  Consent was obtained for phone visit:  Yes.   Answered questions that patient had about telehealth interaction:  Yes.   I discussed the limitations, risks, security and privacy concerns of performing an evaluation and management service by telephone. I also discussed with the patient that there may be a patient responsible charge related to this service. The patient expressed understanding and agreed to proceed.  Patient Location: Home Provider Location: [facility])  ---------------------------------------------------------------------- Chief Complaint  Patient presents with  . Hypertension    bp 144/86, 64 pulse 04/20/19 8:00am morning, 112/64 ,68pulse 10:00am,    . Dysphagia    pt having problems with swallowing hydralazine  . Leg Pain    intermittent bilateral pain legs x 1-2 weeks. That improve with Tylenol.    S: Reviewed CMA documentation. I have called patient, spoke with Melida Quitter (contact, [phone], owner of care home, [place]) -  gathered additional HPI as follows  CHRONIC HTN: Reports some variable BP, see nursing note above. No significant new concerns except question on Hydralazine difficult swallowing Current Meds - Carvedilol 6.'25mg'$  BID - He was previously taking Hydralazine, now cannot swallow it, our records show it was discontinued by me in 2019, they still had the rx Reports good compliance, took meds today. Tolerating well, w/o complaints. Denies CP, dyspnea, HA, edema, dizziness / lightheadedness  Bilateral Lower Leg Pain Reports that symptoms still bothering him, discussed this issue and back pain 3-4 months ago, and he was asked to take higher dose Tylenol instead of 325. He has tried '500mg'$  tylenol at times with some temporary  relief, due to his CKD and other risk factors he cannot take NSAID, he has failed muscle relaxant baclofen before as well due to CKD  Denies any fevers, chills, sweats, body ache, cough, shortness of breath, sinus pain or pressure, headache, abdominal pain, diarrhea  Past Medical History:  Diagnosis Date  . Alcohol abuse   . Alzheimer's dementia with behavioral disturbance ([place])   . Anemia of chronic disease   . Cardiomyopathy due to hypertension, without heart failure ([place])   . Hyperlipidemia   . Lives in group home   . Myocardial infarction ([place])   . Stroke [facility])    Social History   Tobacco Use  . Smoking status: Current Some Day Smoker    Packs/day: 0.25    Types: Cigarettes  . Smokeless tobacco: Current User  Substance Use Topics  . Alcohol use: Not Currently    Alcohol/week: 10.0 standard drinks    Types: 10 Cans of beer per week    Frequency: Never  . Drug use: No    Current Outpatient Medications:  .  acetaminophen (TYLENOL) 500 MG tablet, Take 1-2 tablets (500-1,000 mg total) by mouth every 8 (eight) hours as needed for moderate pain., Disp: 90 tablet, Rfl: 0 .  alum & mag hydroxide-simeth (MAALOX/MYLANTA) 200-200-20 MG/5ML suspension, Take by mouth every 6 (six) hours as needed for indigestion or heartburn., Disp: , Rfl:  .  aspirin 81 MG chewable tablet, TAKE 1 TABLET BY MOUTH ONCE DAILY FOR ANTICOAGULATION, Disp: 90 tablet, Rfl: 3 .  bismuth subsalicylate (PEPTO BISMOL) 262 MG chewable tablet, Chew 262 mg by mouth daily as needed for indigestion or diarrhea or loose stools., Disp: , Rfl:  .  Blood  Glucose Monitoring Suppl (ACCU-CHEK NANO SMARTVIEW) w/Device KIT, Use only for as needed glucose checks if symptomatic., Disp: 1 kit, Rfl: 0 .  carvedilol (COREG) 6.25 MG tablet, TAKE 1 TABLET BY MOUTH TWICE DAILY FOR HR/BP, Disp: 180 tablet, Rfl: 1 .  Cholecalciferol (VITAMIN D3) 2000 units capsule, TAKE 1 TABLET BY MOUTH ONCE DAILY FOR SUPPLEMENT, Disp: 90 capsule, Rfl:  3 .  Docusate Sodium 100 MG capsule, Take 1-2 tablets (100-200 mg total) by mouth daily as needed for constipation., Disp: 30 tablet, Rfl: 5 .  donepezil (ARICEPT) 5 MG tablet, TAKE 1 TABLET BY MOUTH EVERY MORNING., Disp: 90 tablet, Rfl: 1 .  glucose blood (ACCU-CHEK SMARTVIEW) test strip, Check once as needed only if symptoms of hypoglycemia (dizziness, tremors, shaking), Disp: 50 each, Rfl: 12 .  guaifenesin (ROBITUSSIN) 100 MG/5ML syrup, Take 200 mg by mouth 3 (three) times daily as needed for cough., Disp: , Rfl:  .  isosorbide mononitrate (IMDUR) 30 MG 24 hr tablet, TAKE 1 TABLET BY MOUTH ONCE DAILY FOR HEART. DO NOT CRUSH., Disp: 90 tablet, Rfl: 3 .  loperamide (IMODIUM) 2 MG capsule, Take 2 mg by mouth as needed for diarrhea or loose stools., Disp: , Rfl:  .  magnesium hydroxide (MILK OF MAGNESIA) 400 MG/5ML suspension, Take 15 mLs by mouth daily as needed for mild constipation., Disp: , Rfl:  .  Melatonin 10 MG TABS, Take by mouth at bedtime as needed., Disp: , Rfl:  .  neomycin-bacitracin-polymyxin (NEOSPORIN) [id number] ointment, Apply topically as needed., Disp: , Rfl:   Depression screen [facility] 2/9 01/13/2019 07/12/2018 07/12/2018  Decreased Interest 0 0 0  Down, Depressed, Hopeless 0 0 0  PHQ - 2 Score 0 0 0    No flowsheet data found.  -------------------------------------------------------------------------- O: No physical exam performed due to remote telephone encounter.   No results found for this or any previous visit (from the past 2160 hour(s)).  -------------------------------------------------------------------------- A&P:  Problem List Items Addressed This Visit    Benign hypertension with CKD (chronic kidney disease) stage IV ([place]) - Primary    Overall controlled, some mild elevations - he was actually still taking Hydralazine, by report - difficulty swallowing it, despite it being DC'd previously Complication CKD-IV, Hypotension - Followed by Cardiology,  Nephrology OFF Lisinopril  1. - DISCONTINUE Hydralazine again, sent DC order by fax to caregiver  - Continue - Carvedilol 6.'25mg'$  BID 2. Encouraged improve hydration, caution sudden standing / change of position, fall precaution, some postural dizziness may occur 3. Monitor BP still - now reduce freq of BP checks from BID to DAILY - will reduce in future if still controlled 4. Follow-up 3 months       Other Visit Diagnoses    Bilateral leg pain        Similar issue as previously discussed, persistent episodic lower leg pains, can be worse with activity but present at rest, symemtrical, without swelling - Improved on Tylenol - CKD IV cannot take muscle relaxant or NSAID, limited options  Plan - INCREASE Tylenol from '500mg'$  PRN up to '1000mg'$  TID regularly for now then PRN - Try topical muscle rub and heating pad, conservative measures PRN - Follow-up if not improving  No orders of the defined types were placed in this encounter.   Follow-up: - Return as needed for leg pain, BP  Patient verbalizes understanding with the above medical recommendations including the limitation of remote medical advice.  Specific follow-up and call-back criteria were given for patient to follow-up or  seek medical care more urgently if needed.   - Time spent in direct consultation with patient on phone: 11 minutes  Nobie Putnam, [facility] 04/21/2019, 9:07 AM

## 2019-04-21 NOTE — Patient Instructions (Addendum)
AVS info given by phone, no mychart

## 2019-04-27 DIAGNOSIS — F411 Generalized anxiety disorder: Secondary | ICD-10-CM | POA: Diagnosis not present

## 2019-04-27 DIAGNOSIS — F322 Major depressive disorder, single episode, severe without psychotic features: Secondary | ICD-10-CM | POA: Diagnosis not present

## 2019-05-04 DIAGNOSIS — F411 Generalized anxiety disorder: Secondary | ICD-10-CM | POA: Diagnosis not present

## 2019-05-04 DIAGNOSIS — F322 Major depressive disorder, single episode, severe without psychotic features: Secondary | ICD-10-CM | POA: Diagnosis not present

## 2019-05-10 ENCOUNTER — Telehealth: Payer: Self-pay | Admitting: Family Medicine

## 2019-05-10 NOTE — Telephone Encounter (Signed)
@  [place] Chronic [facility] Management   Outreach Note  05/10/2019 Name: Kyle Short MRN: [id number] DOB: 06-21-1946  Referred by: Olin Hauser, DO Reason for referral : Chronic Care Management (Initial CCM outreach call was unsuccessful. )   An unsuccessful telephone outreach was attempted today. The patient was referred to the case management team by for assistance with chronic care management and care coordination.   Follow Up Plan: A HIPPA compliant phone message was left for the patient providing contact information and requesting a return call.  The CM team will reach out to the patient again over the next 7 days.  If patient returns call to provider office, please advise to call [place] at [place]  [email or web address]   ??[phone]

## 2019-05-11 DIAGNOSIS — F411 Generalized anxiety disorder: Secondary | ICD-10-CM | POA: Diagnosis not present

## 2019-05-11 DIAGNOSIS — F322 Major depressive disorder, single episode, severe without psychotic features: Secondary | ICD-10-CM | POA: Diagnosis not present

## 2019-05-15 ENCOUNTER — Telehealth: Payer: Self-pay

## 2019-05-15 ENCOUNTER — Ambulatory Visit: Payer: Self-pay | Admitting: Family Medicine

## 2019-05-15 NOTE — Telephone Encounter (Signed)
Open error 

## 2019-05-15 NOTE — Telephone Encounter (Signed)
@  [place] [facility] Management   Outreach Note  05/15/2019 Name: Kyle Short MRN: [id number] DOB: 1946/12/12  Referred by: Olin Hauser, DO Reason for referral : Chronic Care Management (Initial CCM outreach call was unsuccessful. ) and Chronic Care Management (Second CCM outreach call was unsuccessful)   A second unsuccessful telephone outreach was attempted today. The patient was referred to the case management team for assistance with chronic care management and care coordination.   Follow Up Plan: A HIPPA compliant phone message was left for the patient providing contact information and requesting a return call.  The care management team will reach out to the patient again over the next 7 days.  If patient returns call to provider office, please advise to call [place] at [place]  [email or web address]   ??[phone]

## 2019-05-15 NOTE — Chronic Care Management (AMB) (Signed)
Chronic Care Management   Note  05/15/2019 Name: Kyle Short MRN: [id number] DOB: 10-Jun-1946  Kyle Short is a 73 y.o. year old male who is a primary care patient of Olin Hauser, DO. I reached out to Tor Netters by phone today in response to a referral sent by Kyle Short's health plan.    Kyle Short was given information about Chronic Care Management services today including:  1. CCM service includes personalized support from designated clinical staff supervised by his physician, including individualized plan of care and coordination with other care providers 2. 24/7 contact phone numbers for assistance for urgent and routine care needs. 3. Service will only be billed when office clinical staff spend 20 minutes or more in a month to coordinate care. 4. Only one practitioner may furnish and bill the service in a calendar month. 5. The patient may stop CCM services at any time (effective at the end of the month) by phone call to the office staff. 6. The patient will be responsible for cost sharing (co-pay) of up to 20% of the service fee (after annual deductible is met).  Home Health Nurse agreed to services and verbal consent obtained.   Follow up plan: Telephone appointment with CCM team member scheduled for: 06/12/2019  [place]  [email or web address]   ??[phone]

## 2019-05-18 DIAGNOSIS — F322 Major depressive disorder, single episode, severe without psychotic features: Secondary | ICD-10-CM | POA: Diagnosis not present

## 2019-05-18 DIAGNOSIS — F411 Generalized anxiety disorder: Secondary | ICD-10-CM | POA: Diagnosis not present

## 2019-05-25 DIAGNOSIS — F411 Generalized anxiety disorder: Secondary | ICD-10-CM | POA: Diagnosis not present

## 2019-05-25 DIAGNOSIS — F322 Major depressive disorder, single episode, severe without psychotic features: Secondary | ICD-10-CM | POA: Diagnosis not present

## 2019-06-01 DIAGNOSIS — F411 Generalized anxiety disorder: Secondary | ICD-10-CM | POA: Diagnosis not present

## 2019-06-01 DIAGNOSIS — F322 Major depressive disorder, single episode, severe without psychotic features: Secondary | ICD-10-CM | POA: Diagnosis not present

## 2019-06-08 DIAGNOSIS — F411 Generalized anxiety disorder: Secondary | ICD-10-CM | POA: Diagnosis not present

## 2019-06-08 DIAGNOSIS — F322 Major depressive disorder, single episode, severe without psychotic features: Secondary | ICD-10-CM | POA: Diagnosis not present

## 2019-06-12 ENCOUNTER — Telehealth: Payer: Medicare Other

## 2019-06-15 DIAGNOSIS — F322 Major depressive disorder, single episode, severe without psychotic features: Secondary | ICD-10-CM | POA: Diagnosis not present

## 2019-06-15 DIAGNOSIS — F411 Generalized anxiety disorder: Secondary | ICD-10-CM | POA: Diagnosis not present

## 2019-06-22 DIAGNOSIS — F411 Generalized anxiety disorder: Secondary | ICD-10-CM | POA: Diagnosis not present

## 2019-06-22 DIAGNOSIS — F322 Major depressive disorder, single episode, severe without psychotic features: Secondary | ICD-10-CM | POA: Diagnosis not present

## 2019-06-26 ENCOUNTER — Ambulatory Visit (INDEPENDENT_AMBULATORY_CARE_PROVIDER_SITE_OTHER): Payer: Medicare Other | Admitting: *Deleted

## 2019-06-26 DIAGNOSIS — F02818 Dementia in other diseases classified elsewhere, unspecified severity, with other behavioral disturbance: Secondary | ICD-10-CM

## 2019-06-26 DIAGNOSIS — G308 Other Alzheimer's disease: Secondary | ICD-10-CM

## 2019-06-26 DIAGNOSIS — N184 Chronic kidney disease, stage 4 (severe): Secondary | ICD-10-CM | POA: Diagnosis not present

## 2019-06-26 DIAGNOSIS — I129 Hypertensive chronic kidney disease with stage 1 through stage 4 chronic kidney disease, or unspecified chronic kidney disease: Secondary | ICD-10-CM | POA: Diagnosis not present

## 2019-06-26 DIAGNOSIS — F0281 Dementia in other diseases classified elsewhere with behavioral disturbance: Secondary | ICD-10-CM | POA: Diagnosis not present

## 2019-06-26 NOTE — Chronic Care Management (AMB) (Signed)
  Chronic Care Management   Note  06/26/2019 Name: Kyle Short MRN: [id number] DOB: 24-Feb-1946  Spoke to patient's caregiver Melida Quitter at the groups home where patient lives. Explained to Mr. Sowa services provided by the embedded CCM team at PCP office. Mr. Janeth Rase reports patient is doing well right now, "in good spirait and b/p is well controlled." Mr. Janeth Rase stated the patient did not have CCM needs at this time, but will keep the CCM team number in case of future needs.   Follow up plan: The patient has been provided with contact information for the care management team and has been advised to call with any health related questions or concerns.   Plan to follow up in a few months.   Merlene Morse Doloris Servantes RN, BSN Nurse Case Pharmacist, [facility]  [phone]) [employer]

## 2019-06-26 NOTE — Patient Instructions (Signed)
Thank you allowing the Chronic Care Management Team to be a part of your care! It was a pleasure speaking with you today!   CCM (Chronic Care Management) Team   Paiden Cavell RN, BSN Nurse Care Coordinator  [phone]  Harlow Asa PharmD  Clinical Pharmacist  [phone]  Eula Fried LCSW Clinical Social Worker [phone]      The patient has been provided with contact information for the care management team and has been advised to call with any health related questions or concerns.

## 2019-06-29 DIAGNOSIS — F411 Generalized anxiety disorder: Secondary | ICD-10-CM | POA: Diagnosis not present

## 2019-06-29 DIAGNOSIS — F322 Major depressive disorder, single episode, severe without psychotic features: Secondary | ICD-10-CM | POA: Diagnosis not present

## 2019-07-06 DIAGNOSIS — F411 Generalized anxiety disorder: Secondary | ICD-10-CM | POA: Diagnosis not present

## 2019-07-06 DIAGNOSIS — F322 Major depressive disorder, single episode, severe without psychotic features: Secondary | ICD-10-CM | POA: Diagnosis not present

## 2019-07-07 ENCOUNTER — Other Ambulatory Visit: Payer: Self-pay | Admitting: Family Medicine

## 2019-07-07 DIAGNOSIS — M545 Low back pain, unspecified: Secondary | ICD-10-CM

## 2019-07-11 ENCOUNTER — Other Ambulatory Visit: Payer: Self-pay

## 2019-07-11 ENCOUNTER — Ambulatory Visit (INDEPENDENT_AMBULATORY_CARE_PROVIDER_SITE_OTHER): Payer: Medicare Other | Admitting: Family Medicine

## 2019-07-11 ENCOUNTER — Encounter: Payer: Self-pay | Admitting: Family Medicine

## 2019-07-11 ENCOUNTER — Telehealth: Payer: Self-pay

## 2019-07-11 VITALS — BP 130/84 | HR 67 | Temp 98.1°F | Resp 16 | Ht 73.0 in | Wt 144.0 lb

## 2019-07-11 DIAGNOSIS — G308 Other Alzheimer's disease: Secondary | ICD-10-CM

## 2019-07-11 DIAGNOSIS — M62838 Other muscle spasm: Secondary | ICD-10-CM | POA: Diagnosis not present

## 2019-07-11 DIAGNOSIS — F02818 Dementia in other diseases classified elsewhere, unspecified severity, with other behavioral disturbance: Secondary | ICD-10-CM

## 2019-07-11 DIAGNOSIS — M542 Cervicalgia: Secondary | ICD-10-CM

## 2019-07-11 DIAGNOSIS — F0281 Dementia in other diseases classified elsewhere with behavioral disturbance: Secondary | ICD-10-CM

## 2019-07-11 MED ORDER — DICLOFENAC SODIUM 1 % TD GEL: 2.0000 g | Freq: Three times a day (TID) | TRANSDERMAL | 2 refills | Status: DC | PRN

## 2019-07-11 NOTE — Telephone Encounter (Signed)
Error

## 2019-07-11 NOTE — Patient Instructions (Addendum)
Thank you for coming to the office today.  Neck muscle spasm. Try topical muscle rub up to 3 times daily as needed. Continue Tylenol  Recommend trim nails, but he declines  Please schedule a Follow-up Appointment to: Return if symptoms worsen or fail to improve.  If you have any other questions or concerns, please feel free to call the office or send a message through [place]. You may also schedule an earlier appointment if necessary.  Additionally, you may be receiving a survey about your experience at our office within a few days to 1 week by e-mail or mail. We value your feedback.  Nobie Putnam, DO [place]

## 2019-07-11 NOTE — Progress Notes (Signed)
Subjective:    Patient ID: Kyle Short, male    DOB: 05-13-1946, 73 y.o.   MRN: [id number]  Kyle Short is a 73 y.o. male presenting on 07/11/2019 for Neck Pain (onset 4 days)   HPI   History provided by Melida Quitter (contact, [phone], owner of care home, [place]. Patient has Alzheimer's Dementia unable to provide history.  Neck Pain Reports onset within past 4 days with neck pain, mostly in back of neck, worse throughout the day, with turning head neck, and not radiating pain into arms. He sits often with head leaning forward with poor posture. Taking tylenol PRn with relief. Cannot take NSAId due to CKD. He has failed baclofen muscle relaxant before due to side effect with CKD. Denies numbness tingling worsening pain, headache, fall injury  PMH - Chronic Hypertension, CKD-III, Alzheimer's Dementia  Patient is due for renewal form of FL2.   Depression screen [facility] 2/9 07/11/2019 01/13/2019 07/12/2018  Decreased Interest 0 0 0  Down, Depressed, Hopeless 0 0 0  PHQ - 2 Score 0 0 0    Social History   Tobacco Use  . Smoking status: Current Some Day Smoker    Packs/day: 0.25    Types: Cigarettes  . Smokeless tobacco: Current User  Substance Use Topics  . Alcohol use: Not Currently    Alcohol/week: 10.0 standard drinks    Types: 10 Cans of beer per week    Frequency: Never  . Drug use: No    Review of Systems Per HPI unless specifically indicated above     Objective:    BP 130/84   Pulse 67   Temp 98.1 F (36.7 C) (Oral)   Resp 16   Ht 6\' 1"  (1.854 m)   Wt 144 lb (65.3 kg)   BMI 19.00 kg/m   Wt Readings from Last 3 Encounters:  07/11/19 144 lb (65.3 kg)  01/13/19 143 lb 9.6 oz (65.1 kg)  07/13/18 145 lb 3.2 oz (65.9 kg)    Physical Exam Vitals signs and nursing note reviewed.  Constitutional:      General: He is not in acute distress.    Appearance: He is well-developed. He is not diaphoretic.     Comments:  Well-appearing elderly 73 year old, comfortable, cooperative  HENT:     Head: Normocephalic and atraumatic.  Eyes:     General:        Right eye: No discharge.        Left eye: No discharge.     Conjunctiva/sclera: Conjunctivae normal.  Neck:     Musculoskeletal: Muscular tenderness (left paraspinal muscles with hypertonicity) present. No neck rigidity.     Comments: Mostly intact ROM active, some limited rotation Cardiovascular:     Rate and Rhythm: Normal rate.  Pulmonary:     Effort: Pulmonary effort is normal.  Skin:    General: Skin is warm and dry.     Findings: No erythema or rash.  Neurological:     Mental Status: He is alert and oriented to person, place, and time.  Psychiatric:        Behavior: Behavior normal.     Comments: Limited verbal during history, speech is appropriate but sparse, today he is more verbal. Participates in exam and follows commands well. Limited insight into condition.     Results for orders placed or performed during the hospital encounter of 06/01/18  HIV antibody  Result Value Ref Range   HIV Screen  4th Generation wRfx Non Reactive Non Reactive  Hepatitis B surface antibody  Result Value Ref Range   Hepatitis B-Post <3.1 (L) Immunity>9.9 mIU/mL  Hepatitis A antibody, total  Result Value Ref Range   Hep A Total Ab Negative Negative  Hepatitis B core antibody, total  Result Value Ref Range   Hep B Core Total Ab Negative Negative  Hepatitis B surface antigen  Result Value Ref Range   Hepatitis B Surface Ag Negative Negative  Protime-INR  Result Value Ref Range   Prothrombin Time 12.1 11.4 - 15.2 seconds   INR 0.90   Comprehensive metabolic panel  Result Value Ref Range   Sodium 139 135 - 145 mmol/L   Potassium 3.9 3.5 - 5.1 mmol/L   Chloride 108 101 - 111 mmol/L   CO2 24 22 - 32 mmol/L   Glucose, Bld 85 65 - 99 mg/dL   BUN 35 (H) 6 - 20 mg/dL   Creatinine, Ser 2.54 (H) 0.61 - 1.24 mg/dL   Calcium 9.1 8.9 - 10.3 mg/dL   Total  Protein 8.0 6.5 - 8.1 g/dL   Albumin 3.5 3.5 - 5.0 g/dL   AST 24 15 - 41 U/L   ALT 16 (L) 17 - 63 U/L   Alkaline Phosphatase 70 38 - 126 U/L   Total Bilirubin 0.6 0.3 - 1.2 mg/dL   GFR calc non Af Amer 24 (L) >60 mL/min   GFR calc Af Amer 28 (L) >60 mL/min   Anion gap 7 5 - 15  Hepatitis C genotype  Result Value Ref Range   HCV Genotype 1a    Please Note (HCV): Comment       Assessment & Plan:   Problem List Items Addressed This Visit    Alzheimer's dementia with behavioral disturbance (HCC) - Primary Stable, chronic problem with gradual decline No changes Continue current care plan Review update FL2     Other Visit Diagnoses    Muscle spasms of neck       Relevant Medications   diclofenac sodium (VOLTAREN) 1 % GEL   Neck pain       Relevant Medications   diclofenac sodium (VOLTAREN) 1 % GEL      Clinically with L >R cervical paraspinal muscle spasm with hypertonicity, likely postural related poor posture head down most of time, limited activity ROM - Known OA/DJD, episodic flare - No evidence of injury trauma or radicular symptoms  Continue Tylenol PRN pain Avoid oral NSAID - try topical Diclofenac PRN instead, as advised Avoid muscle relaxant had reaction in past Follow-up if not improve  Meds ordered this encounter  Medications  . diclofenac sodium (VOLTAREN) 1 % GEL    Sig: Apply 2 g topically 3 (three) times daily as needed.    Dispense:  100 g    Refill:  2      Follow up plan: Return if symptoms worsen or fail to improve.   Nobie Putnam, [facility] 07/11/2019, 11:52 AM

## 2019-07-13 DIAGNOSIS — F322 Major depressive disorder, single episode, severe without psychotic features: Secondary | ICD-10-CM | POA: Diagnosis not present

## 2019-07-13 DIAGNOSIS — F411 Generalized anxiety disorder: Secondary | ICD-10-CM | POA: Diagnosis not present

## 2019-07-25 ENCOUNTER — Ambulatory Visit: Payer: Self-pay | Admitting: Family Medicine

## 2019-07-25 ENCOUNTER — Other Ambulatory Visit: Payer: Self-pay | Admitting: Family Medicine

## 2019-07-25 ENCOUNTER — Ambulatory Visit: Payer: Self-pay

## 2019-07-25 ENCOUNTER — Ambulatory Visit (INDEPENDENT_AMBULATORY_CARE_PROVIDER_SITE_OTHER): Payer: Medicare Other

## 2019-07-25 DIAGNOSIS — Z1211 Encounter for screening for malignant neoplasm of colon: Secondary | ICD-10-CM

## 2019-07-25 DIAGNOSIS — Z0000 Encounter for general adult medical examination without abnormal findings: Secondary | ICD-10-CM | POA: Diagnosis not present

## 2019-07-25 NOTE — Progress Notes (Signed)
Subjective:   Kyle Short is a 73 y.o. male who presents for Medicare Annual/Subsequent preventive examination.  This visit is being conducted via phone call  - after an attmept to do on video chat - due to the COVID-19 pandemic. This patient has given me verbal consent via phone to conduct this visit, patient states they are participating from their home address. Some vital signs may be absent or patient reported.   Patient identification: identified by name, DOB, and current address.    Review of Systems:   Cardiac Risk Factors include: advanced age (>43mn, >>37women);hypertension;male gender;dyslipidemia     Objective:    Vitals: There were no vitals taken for this visit.  There is no height or weight on file to calculate BMI.  Advanced Directives 07/12/2018 01/12/2018 12/22/2017 12/22/2017 08/06/2017 08/06/2017 08/06/2017  Does Patient Have a Medical Advance Directive? Yes Yes _0   Type of [place] will Living will - - - - -  Copy of [place] Chart? No - copy requested - - - - - -  Would patient like information on creating a medical advance directive? - - Yes (Inpatient - patient requests chaplain consult to create a medical advance directive) No - Patient declined No - Patient declined Yes (Inpatient - patient requests chaplain consult to create a medical advance directive) No - Patient declined    Tobacco Social History   Tobacco Use  Smoking Status Current Some Day Smoker  . Packs/day: 0.25  . Types: Cigarettes  Smokeless Tobacco Never Used  Tobacco Comment   5-6 day      Ready to quit: No Counseling given: Yes Comment: 5-6 day    Clinical Intake:  Pre-visit preparation completed: Yes  Pain : No/denies pain     Nutritional Risks: None  How often do you need to have someone help you when you read instructions, pamphlets, or other written materials from your doctor or pharmacy?: 1 -  Never  Nutrition Risk Assessment:  Has the patient had any N/V/D within the last 2 months?  No  Does the patient have any non-healing wounds?  No  Has the patient had any unintentional weight loss or weight gain?  No   Diabetes:  Is the patient diabetic?  Yes  If diabetic, was a CBG obtained today?  No  Did the patient bring in their glucometer from home?  No  How often do you monitor your CBG's? Doesn't check, no medications currently .    Diabetic Exams:  Diabetic Eye Exam: Overdue for diabetic eye exam. Pt has been advised about the importance in completing this exam.  Diabetic Foot Exam: Pt has been advised about the importance in completing this exam.   Interpreter Needed?: No  Information entered by :: Hevin Jeffcoat,LPN  Past Medical History:  Diagnosis Date  . Alcohol abuse   . Alzheimer's dementia with behavioral disturbance ([place]   . Anemia of chronic disease   . Cardiomyopathy due to hypertension, without heart failure ([place]   . Hyperlipidemia   . Lives in group home   . Myocardial infarction ([facility]   . Stroke ([facility]    Past Surgical History:  Procedure Laterality Date  . TONSILLECTOMY     Family History  Problem Relation Age of Onset  . Hypertension Other   . Hypertension Mother   . Diabetes Father    Social History   Socioeconomic History  . Marital status:  Divorced    Spouse name: Not on file  . Number of children: Not on file  . Years of education: Not on file  . Highest education level: 11th grade  Occupational History  . Not on file  Social Needs  . Financial resource strain: Not hard at all  . Food insecurity    Worry: Never true    Inability: Never true  . Transportation needs    Medical: No    Non-medical: No  Tobacco Use  . Smoking status: Current Some Day Smoker    Packs/day: 0.25    Types: Cigarettes  . Smokeless tobacco: Never Used  . Tobacco comment: 5-6 day   Substance and Sexual Activity  . Alcohol use: Not Currently     Alcohol/week: 0.0 standard drinks    Frequency: Never  . Drug use: No  . Sexual activity: Yes    Birth control/protection: Condom  Lifestyle  . Physical activity    Days per week: 0 days    Minutes per session: 0 min  . Stress: Not at all  Relationships  . Social connections    Talks on phone: More than three times a week    Gets together: More than three times a week    Attends religious service: More than 4 times per year    Active member of club or organization: No    Attends meetings of clubs or organizations: Never    Relationship status: Divorced  Other Topics Concern  . Not on file  Social History Narrative   Lives at [facility]     Outpatient Encounter Medications as of 07/25/2019  Medication Sig  . aspirin 81 MG chewable tablet TAKE 1 TABLET BY MOUTH ONCE DAILY FOR ANTICOAGULATION  . carvedilol (COREG) 6.25 MG tablet TAKE 1 TABLET BY MOUTH TWICE DAILY FOR HR/BP  . donepezil (ARICEPT) 5 MG tablet TAKE 1 TABLET BY MOUTH EVERY MORNING.  . isosorbide mononitrate (IMDUR) 30 MG 24 hr tablet TAKE 1 TABLET BY MOUTH ONCE DAILY FOR HEART. DO NOT CRUSH.  Marland Kitchen ACETAMINOPHEN EXTRA STRENGTH 500 MG tablet TAKE 2 TABLETS BY MOUTH EVERY 8 HOURS AS NEEDED FOR MODERATE PAIN (Patient not taking: Reported on 07/25/2019)  . alum & mag hydroxide-simeth (MAALOX/MYLANTA) 200-200-20 MG/5ML suspension Take by mouth every 6 (six) hours as needed for indigestion or heartburn.  . bismuth subsalicylate (PEPTO BISMOL) 262 MG chewable tablet Chew 262 mg by mouth daily as needed for indigestion or diarrhea or loose stools.  . Blood Glucose Monitoring Suppl (ACCU-CHEK NANO SMARTVIEW) w/Device KIT Use only for as needed glucose checks if symptomatic. (Patient not taking: Reported on 07/25/2019)  . Cholecalciferol (VITAMIN D3) 2000 units capsule TAKE 1 TABLET BY MOUTH ONCE DAILY FOR SUPPLEMENT (Patient not taking: Reported on 07/25/2019)  . diclofenac sodium (VOLTAREN) 1 % GEL Apply 2 g topically 3 (three) times daily  as needed. (Patient not taking: Reported on 07/25/2019)  . Docusate Sodium 100 MG capsule Take 1-2 tablets (100-200 mg total) by mouth daily as needed for constipation. (Patient not taking: Reported on 07/25/2019)  . glucose blood (ACCU-CHEK SMARTVIEW) test strip Check once as needed only if symptoms of hypoglycemia (dizziness, tremors, shaking) (Patient not taking: Reported on 07/25/2019)  . guaifenesin (ROBITUSSIN) 100 MG/5ML syrup Take 200 mg by mouth 3 (three) times daily as needed for cough.  . loperamide (IMODIUM) 2 MG capsule Take 2 mg by mouth as needed for diarrhea or loose stools.  . magnesium hydroxide (MILK OF MAGNESIA) 400 MG/5ML suspension  Take 15 mLs by mouth daily as needed for mild constipation.  . Melatonin 10 MG TABS Take by mouth at bedtime as needed.  . neomycin-bacitracin-polymyxin (NEOSPORIN) [phone] ointment Apply topically as needed.   No facility-administered encounter medications on file as of 07/25/2019.     Activities of Daily Living In your present state of health, do you have any difficulty performing the following activities: 07/25/2019  Hearing? N  Vision? N  Difficulty concentrating or making decisions? N  Walking or climbing stairs? N  Dressing or bathing? N  Doing errands, shopping? Y  Comment facility  Preparing Food and eating ? N  Using the Toilet? N  In the past six months, have you accidently leaked urine? Y  Comment depends  Do you have problems with loss of bowel control? Y  Managing your Medications? Y  Comment facility manages  Managing your Finances? Y  Comment daughter/ facility assists  Housekeeping or managing your Housekeeping? Y  Comment facility does  Some recent data might be hidden    Patient Care Team: Olin Hauser, DO as PCP - General (Family Medicine) Lavonia Dana, MD as Consulting Physician (Internal Medicine) Gardiner Barefoot, DPM as Consulting Physician (Podiatry) Jonathon Bellows, MD as Consulting Physician  (Gastroenterology) Minor, Dalbert Garnet, RN as Case Manager   Assessment:   This is a routine wellness examination for [employer].  Exercise Activities and Dietary recommendations Current Exercise Habits: The patient does not participate in regular exercise at present, Exercise limited by: None identified  Goals    . Quit Smoking     Smoking cessation discussed        Fall Risk Fall Risk  07/25/2019 07/11/2019 01/13/2019 07/12/2018 07/12/2018  Falls in the past year? 0 0 0 No No  Number falls in past yr: - - - - -  Injury with Fall? - - - - -  Risk for fall due to : - - - - -  Follow up - Falls evaluation completed Falls evaluation completed - -   FALL RISK PREVENTION PERTAINING TO THE HOME:  Any stairs in or around the home? No  If so, are there any without handrails? No   Home free of loose throw rugs in walkways, pet beds, electrical cords, etc? Yes  Adequate lighting in your home to reduce risk of falls? Yes   ASSISTIVE DEVICES UTILIZED TO PREVENT FALLS:  Life alert? No  Use of a cane, walker or w/c? No  Grab bars in the bathroom? Yes  Shower chair or bench in shower? Yes  Elevated toilet seat or a handicapped toilet? Yes    TIMED UP AND GO:  Unable to perform    Depression Screen PHQ 2/9 Scores 07/25/2019 07/11/2019 01/13/2019 07/12/2018  PHQ - 2 Score 0 0 0 0    Cognitive Function MMSE - Mini Mental State Exam 05/13/2017  Orientation to time 1  Orientation to Place 2  Registration 3  Attention/ Calculation 0  Recall 2  Language- name 2 objects 2  Language- repeat 1  Language- follow 3 step command 3  Language- read & follow direction 0  Write a sentence 0  Copy design 0  Total score 14     6CIT Screen 07/12/2018  What Year? 0 points  What month? 0 points  What time? 0 points  Count back from 20 0 points  Months in reverse 0 points  Repeat phrase 2 points  Total Score 2    Immunization History  Administered Date(s) Administered  .  Influenza, High Dose  Seasonal PF 10/14/2015, 10/06/2017, 09/29/2018  . PPD Test 02/10/2017, 02/24/2017  . Pneumococcal Conjugate-13 10/06/2017  . Pneumococcal Polysaccharide-23 07/07/2015    Qualifies for Shingles Vaccine? Yes  Zostavax completed n/a. Due for Shingrix. Education has been provided regarding the importance of this vaccine. Pt has been advised to call insurance company to determine out of pocket expense. Advised may also receive vaccine at local pharmacy or Health Dept. Verbalized acceptance and understanding.   Tdap: Discussed need for TD/TDAP vaccine, patient verbalized understanding that this is not covered as a preventative with there insurance and to call the office if gets develops any new skin injuries, ie: cuts, scrapes, bug bites, or open wounds.  Flu Vaccine: due 08/2019  Pneumococcal Vaccine: up to date   Screening Tests Health Maintenance  Topic Date Due  . OPHTHALMOLOGY EXAM  01/20/2017  . FOOT EXAM  10/06/2018  . HEMOGLOBIN A1C  10/07/2018  . INFLUENZA VACCINE  07/15/2019  . COLONOSCOPY  08/04/2026 (Originally 09/29/1997)  . TETANUS/TDAP  10/30/2026 (Originally 09/29/1966)  . Hepatitis C Screening  Completed  . PNA vac Low Risk Adult  Completed   Cancer Screenings:  Colorectal Screening: referral placed to GI  Lung Cancer Screening: (Low Dose CT Chest recommended if Age 42-80 years, 30 pack-year currently smoking OR have quit w/in 15years.) does not qualify.    Additional Screening:  Hepatitis C Screening: does qualify; Completed 01/13/2019  Dental Screening: Recommended annual dental exams for proper oral hygiene  Community Resource Referral:  CRR required this visit?  No        Plan:  I have personally reviewed and addressed the Medicare Annual Wellness questionnaire and have noted the following in the patient's chart:  A. Medical and social history B. Use of alcohol, tobacco or illicit drugs  C. Current medications and supplements D. Functional ability and  status E.  Nutritional status F.  Physical activity G. Advance directives H. List of other physicians I.  Hospitalizations, surgeries, and ER visits in previous 12 months J.  [place] such as hearing and vision if needed, cognitive and depression L. Referrals and appointments   In addition, I have reviewed and discussed with patient certain preventive protocols, quality metrics, and best practice recommendations. A written personalized care plan for preventive services as well as general preventive health recommendations were provided to patient.   Signed,   Bevelyn Ngo, LPN  2/52/7129 Nurse Health Advisor  Nurse Notes: patient not taking vitamin D, per facility it is not on FL2 form. Do you want patient to take this? If so they need a new rx and FL2 with this on it.

## 2019-07-25 NOTE — Patient Instructions (Signed)
Kyle Short , Thank you for taking time to come for your Medicare Wellness Visit. I appreciate your ongoing commitment to your health goals. Please review the following plan we discussed and let me know if I can assist you in the future.   Screening recommendations/referrals: Colonoscopy: referral placed  Recommended yearly ophthalmology/optometry visit for glaucoma screening and checkup Recommended yearly dental visit for hygiene and checkup  Vaccinations: Influenza vaccine: due 08/2019 Pneumococcal vaccine: up to date Tdap vaccine: due, check with your insurance for coverage Shingles vaccine: shingrix eligible, check with your insurance for coverage    Advanced directives: Please bring a copy of your health care power of attorney and living will to the office at your convenience.  Conditions/risks identified: If you wish to quit smoking, help is available. For free tobacco cessation program offerings call the [facility] at [phone] or Live Well Line at [phone]. You may also visit [email or web address] or email [email or web address] for more information on other programs.   Next appointment: Follow up in one year for your annual wellness visit  Preventive Care 65 Years and Older, Male Preventive care refers to lifestyle choices and visits with your health care provider that can promote health and wellness. What does preventive care include?  A yearly physical exam. This is also called an annual well check.  Dental exams once or twice a year.  Routine eye exams. Ask your health care provider how often you should have your eyes checked.  Personal lifestyle choices, including:  Daily care of your teeth and gums.  Regular physical activity.  Eating a healthy diet.  Avoiding tobacco and drug use.  Limiting alcohol use.  Practicing safe sex.  Taking low doses of aspirin every day.  Taking vitamin and mineral supplements as recommended by your  health care provider. What happens during an annual well check? The services and screenings done by your health care provider during your annual well check will depend on your age, overall health, lifestyle risk factors, and family history of disease. Counseling  Your health care provider may ask you questions about your:  Alcohol use.  Tobacco use.  Drug use.  Emotional well-being.  Home and relationship well-being.  Sexual activity.  Eating habits.  History of falls.  Memory and ability to understand (cognition).  Work and work Statistician. Screening  You may have the following tests or measurements:  Height, weight, and BMI.  Blood pressure.  Lipid and cholesterol levels. These may be checked every 5 years, or more frequently if you are over 39 years old.  Skin check.  Lung cancer screening. You may have this screening every year starting at age 73 if you have a 30-pack-year history of smoking and currently smoke or have quit within the past 15 years.  Fecal occult blood test (FOBT) of the stool. You may have this test every year starting at age 72.  Flexible sigmoidoscopy or colonoscopy. You may have a sigmoidoscopy every 5 years or a colonoscopy every 10 years starting at age 73.  Prostate cancer screening. Recommendations will vary depending on your family history and other risks.  Hepatitis C blood test.  Hepatitis B blood test.  Sexually transmitted disease (STD) testing.  Diabetes screening. This is done by checking your blood sugar (glucose) after you have not eaten for a while (fasting). You may have this done every 1-3 years.  Abdominal aortic aneurysm (AAA) screening. You may need this if you are a current or former smoker.  Osteoporosis. You may be screened starting at age 56 if you are at high risk. Talk with your health care provider about your test results, treatment options, and if necessary, the need for more tests. Vaccines  Your health  care provider may recommend certain vaccines, such as:  Influenza vaccine. This is recommended every year.  Tetanus, diphtheria, and acellular pertussis (Tdap, Td) vaccine. You may need a Td booster every 10 years.  Zoster vaccine. You may need this after age 6.  Pneumococcal 13-valent conjugate (PCV13) vaccine. One dose is recommended after age 25.  Pneumococcal polysaccharide (PPSV23) vaccine. One dose is recommended after age 13. Talk to your health care provider about which screenings and vaccines you need and how often you need them. This information is not intended to replace advice given to you by your health care provider. Make sure you discuss any questions you have with your health care provider. Document Released: 12/27/2015 Document Revised: 08/19/2016 Document Reviewed: 10/01/2015 Elsevier Interactive Patient Education  2017 [employer] Prevention in the Home Falls can cause injuries. They can happen to people of all ages. There are many things you can do to make your home safe and to help prevent falls. What can I do on the outside of my home?  Regularly fix the edges of walkways and driveways and fix any cracks.  Remove anything that might make you trip as you walk through a door, such as a raised step or threshold.  Trim any bushes or trees on the path to your home.  Use bright outdoor lighting.  Clear any walking paths of anything that might make someone trip, such as rocks or tools.  Regularly check to see if handrails are loose or broken. Make sure that both sides of any steps have handrails.  Any raised decks and porches should have guardrails on the edges.  Have any leaves, snow, or ice cleared regularly.  Use sand or salt on walking paths during winter.  Clean up any spills in your garage right away. This includes oil or grease spills. What can I do in the bathroom?  Use night lights.  Install grab bars by the toilet and in the tub and  shower. Do not use towel bars as grab bars.  Use non-skid mats or decals in the tub or shower.  If you need to sit down in the shower, use a plastic, non-slip stool.  Keep the floor dry. Clean up any water that spills on the floor as soon as it happens.  Remove soap buildup in the tub or shower regularly.  Attach bath mats securely with double-sided non-slip rug tape.  Do not have throw rugs and other things on the floor that can make you trip. What can I do in the bedroom?  Use night lights.  Make sure that you have a light by your bed that is easy to reach.  Do not use any sheets or blankets that are too big for your bed. They should not hang down onto the floor.  Have a firm chair that has side arms. You can use this for support while you get dressed.  Do not have throw rugs and other things on the floor that can make you trip. What can I do in the kitchen?  Clean up any spills right away.  Avoid walking on wet floors.  Keep items that you use a lot in easy-to-reach places.  If you need to reach something above you, use a strong step  stool that has a grab bar.  Keep electrical cords out of the way.  Do not use floor polish or wax that makes floors slippery. If you must use wax, use non-skid floor wax.  Do not have throw rugs and other things on the floor that can make you trip. What can I do with my stairs?  Do not leave any items on the stairs.  Make sure that there are handrails on both sides of the stairs and use them. Fix handrails that are broken or loose. Make sure that handrails are as long as the stairways.  Check any carpeting to make sure that it is firmly attached to the stairs. Fix any carpet that is loose or worn.  Avoid having throw rugs at the top or bottom of the stairs. If you do have throw rugs, attach them to the floor with carpet tape.  Make sure that you have a light switch at the top of the stairs and the bottom of the stairs. If you do not  have them, ask someone to add them for you. What else can I do to help prevent falls?  Wear shoes that:  Do not have high heels.  Have rubber bottoms.  Are comfortable and fit you well.  Are closed at the toe. Do not wear sandals.  If you use a stepladder:  Make sure that it is fully opened. Do not climb a closed stepladder.  Make sure that both sides of the stepladder are locked into place.  Ask someone to hold it for you, if possible.  Clearly mark and make sure that you can see:  Any grab bars or handrails.  First and last steps.  Where the edge of each step is.  Use tools that help you move around (mobility aids) if they are needed. These include:  Canes.  Walkers.  Scooters.  Crutches.  Turn on the lights when you go into a dark area. Replace any light bulbs as soon as they burn out.  Set up your furniture so you have a clear path. Avoid moving your furniture around.  If any of your floors are uneven, fix them.  If there are any pets around you, be aware of where they are.  Review your medicines with your doctor. Some medicines can make you feel dizzy. This can increase your chance of falling. Ask your doctor what other things that you can do to help prevent falls. This information is not intended to replace advice given to you by your health care provider. Make sure you discuss any questions you have with your health care provider. Document Released: 09/26/2009 Document Revised: 05/07/2016 Document Reviewed: 01/04/2015 Elsevier Interactive Patient Education  2017 [employer].

## 2019-08-07 ENCOUNTER — Encounter: Payer: Self-pay | Admitting: *Deleted

## 2019-08-17 ENCOUNTER — Other Ambulatory Visit: Payer: Self-pay

## 2019-08-17 ENCOUNTER — Telehealth: Payer: Self-pay | Admitting: Gastroenterology

## 2019-08-17 DIAGNOSIS — R809 Proteinuria, unspecified: Secondary | ICD-10-CM | POA: Diagnosis not present

## 2019-08-17 DIAGNOSIS — I509 Heart failure, unspecified: Secondary | ICD-10-CM | POA: Diagnosis not present

## 2019-08-17 DIAGNOSIS — E1122 Type 2 diabetes mellitus with diabetic chronic kidney disease: Secondary | ICD-10-CM | POA: Diagnosis not present

## 2019-08-17 DIAGNOSIS — E785 Hyperlipidemia, unspecified: Secondary | ICD-10-CM | POA: Diagnosis not present

## 2019-08-17 DIAGNOSIS — Z1211 Encounter for screening for malignant neoplasm of colon: Secondary | ICD-10-CM

## 2019-08-17 DIAGNOSIS — I110 Hypertensive heart disease with heart failure: Secondary | ICD-10-CM | POA: Diagnosis not present

## 2019-08-17 DIAGNOSIS — N2581 Secondary hyperparathyroidism of renal origin: Secondary | ICD-10-CM | POA: Diagnosis not present

## 2019-08-17 DIAGNOSIS — I10 Essential (primary) hypertension: Secondary | ICD-10-CM | POA: Diagnosis not present

## 2019-08-17 DIAGNOSIS — E872 Acidosis: Secondary | ICD-10-CM | POA: Diagnosis not present

## 2019-08-17 DIAGNOSIS — N184 Chronic kidney disease, stage 4 (severe): Secondary | ICD-10-CM | POA: Diagnosis not present

## 2019-08-17 DIAGNOSIS — D631 Anemia in chronic kidney disease: Secondary | ICD-10-CM | POA: Diagnosis not present

## 2019-08-17 DIAGNOSIS — E875 Hyperkalemia: Secondary | ICD-10-CM | POA: Diagnosis not present

## 2019-08-17 NOTE — Telephone Encounter (Signed)
Pt needs to schedule colonoscopy he left  a vm

## 2019-08-23 DIAGNOSIS — N184 Chronic kidney disease, stage 4 (severe): Secondary | ICD-10-CM | POA: Diagnosis not present

## 2019-08-23 DIAGNOSIS — I129 Hypertensive chronic kidney disease with stage 1 through stage 4 chronic kidney disease, or unspecified chronic kidney disease: Secondary | ICD-10-CM | POA: Diagnosis not present

## 2019-08-23 DIAGNOSIS — E1122 Type 2 diabetes mellitus with diabetic chronic kidney disease: Secondary | ICD-10-CM | POA: Diagnosis not present

## 2019-08-23 DIAGNOSIS — D631 Anemia in chronic kidney disease: Secondary | ICD-10-CM | POA: Diagnosis not present

## 2019-09-07 ENCOUNTER — Telehealth: Payer: Self-pay | Admitting: Gastroenterology

## 2019-09-07 ENCOUNTER — Other Ambulatory Visit: Payer: Self-pay

## 2019-09-07 ENCOUNTER — Other Ambulatory Visit
Admission: RE | Admit: 2019-09-07 | Discharge: 2019-09-07 | Disposition: A | Discharge status: Home / Self Care | Payer: Medicare Other | Source: Ambulatory Visit | Attending: Gastroenterology | Admitting: Gastroenterology

## 2019-09-07 DIAGNOSIS — Z20828 Contact with and (suspected) exposure to other viral communicable diseases: Secondary | ICD-10-CM | POA: Diagnosis not present

## 2019-09-07 DIAGNOSIS — Z01812 Encounter for preprocedural laboratory examination: Secondary | ICD-10-CM | POA: Insufficient documentation

## 2019-09-07 DIAGNOSIS — Z1211 Encounter for screening for malignant neoplasm of colon: Secondary | ICD-10-CM

## 2019-09-07 MED ORDER — NA SULFATE-K SULFATE-MG SULF 17.5-3.13-1.6 GM/177ML PO SOLN: 1.0000 | Freq: Once | ORAL | 0 refills | Status: AC

## 2019-09-07 NOTE — Telephone Encounter (Signed)
Someone  left vm pt has procedure coming up and has not received his bowel prep kit he is supposed to go for his Covid test today and will hold off on going until he received his kit please call

## 2019-09-07 NOTE — Telephone Encounter (Signed)
SuPrep has been sent electronically to pharmacy.  Thanks [employer]

## 2019-09-08 ENCOUNTER — Encounter: Payer: Self-pay | Admitting: *Deleted

## 2019-09-08 LAB — SARS CORONAVIRUS 2 (TAT 6-24 HRS): SARS Coronavirus 2: NEGATIVE

## 2019-09-11 ENCOUNTER — Encounter: Payer: Self-pay | Admitting: Emergency Medicine

## 2019-09-11 ENCOUNTER — Ambulatory Visit: Payer: Medicare Other | Admitting: Anesthesiology

## 2019-09-11 ENCOUNTER — Other Ambulatory Visit: Payer: Self-pay

## 2019-09-11 ENCOUNTER — Ambulatory Visit
Admission: RE | Admit: 2019-09-11 | Discharge: 2019-09-11 | Disposition: A | Discharge status: Home / Self Care | Payer: Medicare Other | Attending: Gastroenterology | Admitting: Gastroenterology

## 2019-09-11 ENCOUNTER — Encounter
Admission: RE | Disposition: A | Discharge status: Home / Self Care | Payer: Self-pay | Source: Home / Self Care | Attending: Gastroenterology

## 2019-09-11 DIAGNOSIS — Z1211 Encounter for screening for malignant neoplasm of colon: Secondary | ICD-10-CM | POA: Diagnosis not present

## 2019-09-11 DIAGNOSIS — I252 Old myocardial infarction: Secondary | ICD-10-CM | POA: Diagnosis not present

## 2019-09-11 DIAGNOSIS — I1310 Hypertensive heart and chronic kidney disease without heart failure, with stage 1 through stage 4 chronic kidney disease, or unspecified chronic kidney disease: Secondary | ICD-10-CM | POA: Diagnosis not present

## 2019-09-11 DIAGNOSIS — I129 Hypertensive chronic kidney disease with stage 1 through stage 4 chronic kidney disease, or unspecified chronic kidney disease: Secondary | ICD-10-CM | POA: Diagnosis not present

## 2019-09-11 DIAGNOSIS — Z79899 Other long term (current) drug therapy: Secondary | ICD-10-CM | POA: Diagnosis not present

## 2019-09-11 DIAGNOSIS — G309 Alzheimer's disease, unspecified: Secondary | ICD-10-CM | POA: Diagnosis not present

## 2019-09-11 DIAGNOSIS — F0281 Dementia in other diseases classified elsewhere with behavioral disturbance: Secondary | ICD-10-CM | POA: Diagnosis not present

## 2019-09-11 DIAGNOSIS — Z8673 Personal history of transient ischemic attack (TIA), and cerebral infarction without residual deficits: Secondary | ICD-10-CM | POA: Diagnosis not present

## 2019-09-11 DIAGNOSIS — Z539 Procedure and treatment not carried out, unspecified reason: Secondary | ICD-10-CM | POA: Diagnosis not present

## 2019-09-11 DIAGNOSIS — K56699 Other intestinal obstruction unspecified as to partial versus complete obstruction: Secondary | ICD-10-CM | POA: Insufficient documentation

## 2019-09-11 DIAGNOSIS — Z7982 Long term (current) use of aspirin: Secondary | ICD-10-CM | POA: Insufficient documentation

## 2019-09-11 DIAGNOSIS — I43 Cardiomyopathy in diseases classified elsewhere: Secondary | ICD-10-CM | POA: Diagnosis not present

## 2019-09-11 DIAGNOSIS — E1122 Type 2 diabetes mellitus with diabetic chronic kidney disease: Secondary | ICD-10-CM | POA: Insufficient documentation

## 2019-09-11 DIAGNOSIS — N184 Chronic kidney disease, stage 4 (severe): Secondary | ICD-10-CM | POA: Diagnosis not present

## 2019-09-11 DIAGNOSIS — F17210 Nicotine dependence, cigarettes, uncomplicated: Secondary | ICD-10-CM | POA: Diagnosis not present

## 2019-09-11 HISTORY — PX: COLONOSCOPY WITH PROPOFOL: SHX5780

## 2019-09-11 LAB — GLUCOSE, CAPILLARY: Glucose-Capillary: 59 mg/dL — ABNORMAL LOW (ref 70–99)

## 2019-09-11 SURGERY — COLONOSCOPY WITH PROPOFOL
Anesthesia: General

## 2019-09-11 MED ORDER — PROPOFOL 10 MG/ML IV BOLUS
INTRAVENOUS | Status: DC | PRN
  Administered 2019-09-11: 20 mg via INTRAVENOUS
  Administered 2019-09-11: 30 mg via INTRAVENOUS

## 2019-09-11 MED ORDER — SODIUM CHLORIDE 0.9 % IV SOLN
INTRAVENOUS | Status: DC
  Administered 2019-09-11: 1000 mL via INTRAVENOUS

## 2019-09-11 MED ORDER — PROPOFOL 500 MG/50ML IV EMUL
INTRAVENOUS | Status: DC | PRN
  Administered 2019-09-11: 50 ug/kg/min via INTRAVENOUS

## 2019-09-11 MED ORDER — LIDOCAINE HCL (PF) 2 % IJ SOLN
INTRAMUSCULAR | Status: DC | PRN
  Administered 2019-09-11: 60 mg

## 2019-09-11 NOTE — Anesthesia Postprocedure Evaluation (Signed)
Anesthesia Post Note  Patient: Kyle Short  Procedure(s) Performed: COLONOSCOPY WITH PROPOFOL (N/A )  Patient location during evaluation: Endoscopy Anesthesia Type: General Level of consciousness: awake and alert Pain management: pain level controlled Vital Signs Assessment: post-procedure vital signs reviewed and stable Respiratory status: spontaneous breathing and respiratory function stable Cardiovascular status: stable Anesthetic complications: no     Last Vitals:  Vitals:   09/11/19 0956 09/11/19 1048  BP: (!) 168/103 (!) 143/86  Pulse: 61 (!) 48  Resp: 17 15  Temp: (!) 35.4 C (!) 36.1 C  SpO2: 99% 100%    Last Pain:  Vitals:   09/11/19 1048  TempSrc: Tympanic  PainSc: Asleep                 Brigit Doke K

## 2019-09-11 NOTE — Anesthesia Preprocedure Evaluation (Signed)
Anesthesia Evaluation  Patient identified by MRN, date of birth, ID band Patient confused    Reviewed: Allergy & Precautions, NPO status , Patient's Chart, lab work & pertinent test results  History of Anesthesia Complications Negative for: history of anesthetic complications  Airway Mallampati: III       Dental  (+) Edentulous Upper, Edentulous Lower   Pulmonary neg sleep apnea, neg COPD, Current Smoker,           Cardiovascular hypertension, Pt. on medications + Past MI  (-) CHF (-) dysrhythmias (-) Valvular Problems/Murmurs     Neuro/Psych neg Seizures Dementia CVA    GI/Hepatic neg GERD  ,(+) Hepatitis - (treated), C  Endo/Other  diabetes (not on meds now), Type 2  Renal/GU Renal InsufficiencyRenal disease     Musculoskeletal   Abdominal   Peds  Hematology  (+) anemia ,   Anesthesia Other Findings   Reproductive/Obstetrics                             Anesthesia Physical Anesthesia Plan  ASA: III  Anesthesia Plan: General   Post-op Pain Management:    Induction: Intravenous  PONV Risk Score and Plan: 1 and Propofol infusion and TIVA  Airway Management Planned: Nasal Cannula  Additional Equipment:   Intra-op Plan:   Post-operative Plan:   Informed Consent: I have reviewed the patients History and Physical, chart, labs and discussed the procedure including the risks, benefits and alternatives for the proposed anesthesia with the patient or authorized representative who has indicated his/her understanding and acceptance.       Plan Discussed with:   Anesthesia Plan Comments:         Anesthesia Quick Evaluation

## 2019-09-11 NOTE — Transfer of Care (Signed)
Immediate Anesthesia Transfer of Care Note  Patient: Kyle Short  Procedure(s) Performed: COLONOSCOPY WITH PROPOFOL (N/A )  Patient Location: PACU  Anesthesia Type:General  Level of Consciousness: sedated  Airway & Oxygen Therapy: Patient Spontanous Breathing and Patient connected to nasal cannula oxygen  Post-op Assessment: Report given to RN and Post -op Vital signs reviewed and stable  Post vital signs: Reviewed and stable  Last Vitals:  Vitals Value Taken Time  BP    Temp    Pulse    Resp    SpO2      Last Pain:  Vitals:   09/11/19 0956  TempSrc: Tympanic  PainSc: 0-No pain         Complications: No apparent anesthesia complications

## 2019-09-11 NOTE — Op Note (Signed)
[facility] Gastroenterology Patient Name: Kyle Short Procedure Date: 09/11/2019 10:30 AM MRN: [id number] Account #: [id number] Date of Birth: 1946-03-01 Admit Type: Outpatient Age: 73 Room: [facility] ENDO ROOM 4 Gender: Male Note Status: Finalized Procedure:            Colonoscopy Indications:          Screening for colorectal malignant neoplasm Providers:            Jonathon Bellows MD, MD Referring MD:         Olin Hauser (Referring MD) Medicines:            Monitored Anesthesia Care Complications:        No immediate complications. Procedure:            Pre-Anesthesia Assessment:                       - Prior to the procedure, a History and Physical was                        performed, and patient medications, allergies and                        sensitivities were reviewed. The patient's tolerance of                        previous anesthesia was reviewed.                       - The risks and benefits of the procedure and the                        sedation options and risks were discussed with the                        patient. All questions were answered and informed                        consent was obtained.                       - ASA Grade Assessment: III - A patient with severe                        systemic disease.                       After obtaining informed consent, the colonoscope was                        passed under direct vision. Throughout the procedure,                        the patient's blood pressure, pulse, and oxygen                        saturations were monitored continuously. The                        Colonoscope was introduced through the anus with the  intention of advancing to the cecum. The scope was                        advanced to the sigmoid colon before the procedure was                        aborted. Medications were given. The colonoscopy was                        performed with  moderate difficulty due to a tortuous                        colon. The patient tolerated the procedure well. The                        quality of the bowel preparation was excellent. Findings:      The perianal and digital rectal examinations were normal.      Very tight angulation seen at the proximal sigmoid colon followed by       tight lumjen - felt unsafe to proceed further ,      The exam was otherwise without abnormality. 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                       - The examination was otherwise normal.                       - No specimens collected. Recommendation:       - Discharge patient to home (with escort).                       - Resume previous diet.                       - Continue present medications.                       - CT colonography - if negative no further testing or                        screening for cololn cancer Procedure Code(s):    --- Professional ---                       [id number], 53, Colonoscopy, flexible; diagnostic, including                        collection of specimen(s) by brushing or washing, when                        performed (separate procedure) CPT copyright 2019 American Medical Association. All rights reserved. The codes documented in this report are preliminary and upon coder review may  be revised to meet current compliance requirements. Jonathon Bellows, MD Jonathon Bellows MD, MD 09/11/2019 10:45:56 AM This report has been signed electronically. Number of Addenda: 0 Note Initiated On: 09/11/2019 10:30 AM Total Procedure Duration: 0 hours 7 minutes 37 seconds  Estimated Blood Loss: Estimated blood loss: none.      [facility]

## 2019-09-11 NOTE — H&P (Signed)
Jonathon Bellows, MD [address], [place], [place], [place], [address] [address], [place], [place], [place], [address] Phone: [phone]  Fax: [phone]  Primary Care Physician:  Olin Hauser, DO   Pre-Procedure History & Physical: HPI:  Kyle Short is a 73 y.o. male is here for an colonoscopy.   Past Medical History:  Diagnosis Date  . Alcohol abuse   . Alzheimer's dementia with behavioral disturbance ([place])   . Anemia of chronic disease   . Cardiomyopathy due to hypertension, without heart failure ([place])   . Hyperlipidemia   . Lives in group home   . Myocardial infarction ([place])   . Stroke [facility])     Past Surgical History:  Procedure Laterality Date  . TONSILLECTOMY      Prior to Admission medications   Medication Sig Start Date End Date Taking? Authorizing Provider  ACETAMINOPHEN EXTRA STRENGTH 500 MG tablet TAKE 2 TABLETS BY MOUTH EVERY 8 HOURS AS NEEDED FOR MODERATE PAIN 07/07/19  Yes Karamalegos, Devonne Doughty, DO  alum & mag hydroxide-simeth (MAALOX/MYLANTA) 200-200-20 MG/5ML suspension Take by mouth every 6 (six) hours as needed for indigestion or heartburn.   Yes [provider]  aspirin 81 MG chewable tablet TAKE 1 TABLET BY MOUTH ONCE DAILY FOR ANTICOAGULATION 01/23/19  Yes Karamalegos, Devonne Doughty, DO  bismuth subsalicylate (PEPTO BISMOL) 262 MG chewable tablet Chew 262 mg by mouth daily as needed for indigestion or diarrhea or loose stools.   Yes [provider]  carvedilol (COREG) 6.25 MG tablet TAKE 1 TABLET BY MOUTH TWICE DAILY FOR HR/BP 03/28/19  Yes Karamalegos, Alexander J, DO  donepezil (ARICEPT) 5 MG tablet TAKE 1 TABLET BY MOUTH EVERY MORNING. 03/28/19  Yes Karamalegos, Devonne Doughty, DO  guaifenesin (ROBITUSSIN) 100 MG/5ML syrup Take 200 mg by mouth 3 (three) times daily as needed for cough.   Yes [provider]  isosorbide mononitrate (IMDUR) 30 MG 24 hr tablet TAKE 1 TABLET BY MOUTH ONCE DAILY  FOR HEART. DO NOT CRUSH. 01/23/19  Yes Karamalegos, Devonne Doughty, DO  loperamide (IMODIUM) 2 MG capsule Take 2 mg by mouth as needed for diarrhea or loose stools.   Yes [provider]  magnesium hydroxide (MILK OF MAGNESIA) 400 MG/5ML suspension Take 15 mLs by mouth daily as needed for mild constipation.   Yes [provider]  Melatonin 10 MG TABS Take by mouth at bedtime as needed.   Yes [provider]  neomycin-bacitracin-polymyxin (NEOSPORIN) [phone] ointment Apply topically as needed.   Yes [provider]  Blood Glucose Monitoring Suppl (ACCU-CHEK NANO SMARTVIEW) w/Device KIT Use only for as needed glucose checks if symptomatic. Patient not taking: Reported on 07/25/2019 02/24/17   Olin Hauser, DO  diclofenac sodium (VOLTAREN) 1 % GEL Apply 2 g topically 3 (three) times daily as needed. Patient not taking: Reported on 07/25/2019 07/11/19   Olin Hauser, DO  Docusate Sodium 100 MG capsule Take 1-2 tablets (100-200 mg total) by mouth daily as needed for constipation. Patient not taking: Reported on 07/25/2019 01/13/19   Olin Hauser, DO  glucose blood (ACCU-CHEK SMARTVIEW) test strip Check once as needed only if symptoms of hypoglycemia (dizziness, tremors, shaking) Patient not taking: Reported on 07/25/2019 02/24/17   Olin Hauser, DO    Allergies as of 08/18/2019  . (No Known Allergies)    Family History  Problem Relation Age of Onset  . Hypertension Other   .  Hypertension Mother   . Diabetes Father     Social History   Socioeconomic History  . Marital status: Divorced    Spouse name: Not on file  . Number of children: Not on file  . Years of education: Not on file  . Highest education level: 11th grade  Occupational History  . Not on file  Social Needs  . Financial resource strain: Not hard at all  . Food insecurity    Worry: Never true    Inability: Never true  . Transportation needs     Medical: No    Non-medical: No  Tobacco Use  . Smoking status: Current Some Day Smoker    Packs/day: 0.25    Types: Cigarettes  . Smokeless tobacco: Never Used  . Tobacco comment: 5-6 day   Substance and Sexual Activity  . Alcohol use: Not Currently    Alcohol/week: 0.0 standard drinks    Frequency: Never  . Drug use: No  . Sexual activity: Yes    Birth control/protection: Condom  Lifestyle  . Physical activity    Days per week: 0 days    Minutes per session: 0 min  . Stress: Not at all  Relationships  . Social connections    Talks on phone: More than three times a week    Gets together: More than three times a week    Attends religious service: More than 4 times per year    Active member of club or organization: No    Attends meetings of clubs or organizations: Never    Relationship status: Divorced  . Intimate partner violence    Fear of current or ex partner: No    Emotionally abused: No    Physically abused: No    Forced sexual activity: No  Other Topics Concern  . Not on file  Social History Narrative   Lives at [facility]     Review of Systems: See HPI, otherwise negative ROS  Physical Exam: BP (!) 168/103   Pulse 61   Temp (!) 95.7 F (35.4 C) (Tympanic)   Resp 17   Ht _0  (1.88 m)   Wt 67.1 kg   SpO2 99%   BMI 19.00 kg/m  General:   Alert,  pleasant and cooperative in NAD Head:  Normocephalic and atraumatic. Neck:  Supple; no masses or thyromegaly. Lungs:  Clear throughout to auscultation, normal respiratory effort.    Heart:  +S1, +S2, Regular rate and rhythm, No edema. Abdomen:  Soft, nontender and nondistended. Normal bowel sounds, without guarding, and without rebound.   Neurologic:  Alert and  oriented x4;  grossly normal neurologically.  Impression/Plan: Kyle Short is here for an colonoscopy to be performed for Screening colonoscopy average risk   Risks, benefits, limitations, and alternatives regarding  colonoscopy have been reviewed  with the patient.  Questions have been answered.  All parties agreeable.   Jonathon Bellows, MD  09/11/2019, 10:21 AM

## 2019-09-11 NOTE — Anesthesia Post-op Follow-up Note (Signed)
Anesthesia QCDR form completed.        

## 2019-09-13 ENCOUNTER — Encounter: Payer: Self-pay | Admitting: Gastroenterology

## 2019-10-17 ENCOUNTER — Other Ambulatory Visit: Payer: Self-pay | Admitting: Family Medicine

## 2019-10-17 DIAGNOSIS — G308 Other Alzheimer's disease: Secondary | ICD-10-CM

## 2019-10-17 DIAGNOSIS — I10 Essential (primary) hypertension: Secondary | ICD-10-CM

## 2019-10-17 DIAGNOSIS — F0281 Dementia in other diseases classified elsewhere with behavioral disturbance: Secondary | ICD-10-CM

## 2019-10-19 ENCOUNTER — Other Ambulatory Visit: Payer: Self-pay

## 2019-10-19 ENCOUNTER — Ambulatory Visit (INDEPENDENT_AMBULATORY_CARE_PROVIDER_SITE_OTHER): Payer: Medicare Other | Admitting: Family Medicine

## 2019-10-19 ENCOUNTER — Encounter: Payer: Self-pay | Admitting: Family Medicine

## 2019-10-19 VITALS — BP 149/82 | HR 59 | Temp 98.4°F | Resp 16 | Ht 73.0 in | Wt 143.6 lb

## 2019-10-19 DIAGNOSIS — E1122 Type 2 diabetes mellitus with diabetic chronic kidney disease: Secondary | ICD-10-CM | POA: Diagnosis not present

## 2019-10-19 DIAGNOSIS — I129 Hypertensive chronic kidney disease with stage 1 through stage 4 chronic kidney disease, or unspecified chronic kidney disease: Secondary | ICD-10-CM | POA: Diagnosis not present

## 2019-10-19 DIAGNOSIS — G308 Other Alzheimer's disease: Secondary | ICD-10-CM | POA: Diagnosis not present

## 2019-10-19 DIAGNOSIS — N184 Chronic kidney disease, stage 4 (severe): Secondary | ICD-10-CM

## 2019-10-19 DIAGNOSIS — F1021 Alcohol dependence, in remission: Secondary | ICD-10-CM

## 2019-10-19 DIAGNOSIS — Z23 Encounter for immunization: Secondary | ICD-10-CM

## 2019-10-19 DIAGNOSIS — F0281 Dementia in other diseases classified elsewhere with behavioral disturbance: Secondary | ICD-10-CM

## 2019-10-19 LAB — POCT GLYCOSYLATED HEMOGLOBIN (HGB A1C): Hemoglobin A1C: 6.5 % — AB (ref 4.0–5.6)

## 2019-10-19 MED ORDER — CARVEDILOL 6.25 MG PO TABS: 6.2500 mg | ORAL_TABLET | Freq: Every day | ORAL | 1 refills | Status: AC

## 2019-10-19 MED ORDER — CARVEDILOL 12.5 MG PO TABS: 12.5000 mg | ORAL_TABLET | Freq: Every day | ORAL | 1 refills | Status: AC

## 2019-10-19 NOTE — Assessment & Plan Note (Signed)
Well-controlled DM with A1c 6.5 off all meds still Complications - CKD-IV - OFF insulin  Plan:  1. Remain off all meds 2. Encourage improved lifestyle maintain appropriate diet 3. Check CBG , bring log to next visit for review 4. Continue ASA - OFF ACEi, Statin - side effects / hypotension

## 2019-10-19 NOTE — Assessment & Plan Note (Signed)
Stable, Alcohol free

## 2019-10-19 NOTE — Assessment & Plan Note (Signed)
Slight elevated BP now Complication CKD-IV, Hypotension in past - Followed by Cardiology, Nephrology OFF Lisinopril, Hydralazine  1. Slight increase on Carvedilol from 6.25mg  BID - now to 12.5mg  in AM and 6.25mg  PM - new rx sent and ordered - future may need to increase PM dose to 12.5mg  if indicated based on readings 2. Encouraged improve hydration, caution sudden standing / change of position, fall precaution, some postural dizziness may occur 3. Monitor BP more regularly for 2 weeks, call with results - adjust med if need 4. Follow-up 3 months

## 2019-10-19 NOTE — Progress Notes (Signed)
Subjective:    Patient ID: Kyle Short, male    DOB: 04-03-1946, 73 y.o.   MRN: [id number]  Kyle Short is a 73 y.o. male presenting on 10/19/2019 for Hypertension  History provided by Melida Quitter (contact, [phone], owner of care home,[place]. Patient has Alzheimer's Dementia unable to provide history.  HPI  CHRONIC HTN with CKD-IV Followed by Nephrology Reports some variable BP. Higher reading lately BP recent yesterday 157/101, HR 69, then SBP 142 No new concerns or symptoms. Still some edema in ankles, in past he had low BP on lisinopril and other med. Current Meds - Carvedilol 6.25mg  BID Reports good compliance, took meds today. Tolerating well, w/o complaints. Admits ankle edema Denies CP, dyspnea, HA, dizziness / lightheadedness  CHRONIC DM, Type 2 No concerns on sugar readings. Last A1c trend well controlled 6.3 to 6.2 Due today Admits appetite is good, staying hydrated CBGs:Unremarkable, no low readings. Meds: None No longer on ACEi Complication with sensory ataxia per Neurology He will schedule visit with [facility] Denies hypoglycemia, polyuria, visual changes, numbness or tingling  PMH - Alzheimer's Dementia with mild behavioral disturbance, history of CVA no significant changes or cognitive decline. Maintaining since last visit.  PMH Alcohol dependence in remission. No alcohol consumed.  Health Maintenance: Due for Flu Shot, will receive today    Depression screen [facility] 2/9 10/19/2019 07/25/2019 07/11/2019  Decreased Interest 0 0 0  Down, Depressed, Hopeless 0 0 0  PHQ - 2 Score 0 0 0    Social History   Tobacco Use  . Smoking status: Current Some Day Smoker    Packs/day: 0.25    Types: Cigarettes  . Smokeless tobacco: Never Used  . Tobacco comment: 5-6 day   Substance Use Topics  . Alcohol use: Not Currently    Alcohol/week: 0.0 standard drinks    Frequency: Never  . Drug use: No    Review of Systems  Per HPI unless specifically indicated above     Objective:    BP (!) 149/82   Pulse (!) 59   Temp 98.4 F (36.9 C) (Oral)   Resp 16   Ht 6\' 1"  (1.854 m)   Wt 143 lb 9.6 oz (65.1 kg)   BMI 18.95 kg/m   Wt Readings from Last 3 Encounters:  10/19/19 143 lb 9.6 oz (65.1 kg)  09/11/19 148 lb (67.1 kg)  07/11/19 144 lb (65.3 kg)    Physical Exam Vitals signs and nursing note reviewed.  Constitutional:      General: He is not in acute distress.    Appearance: He is well-developed. He is not diaphoretic.     Comments: Well-appearing elderly 73 year old, comfortable, cooperative  HENT:     Head: Normocephalic and atraumatic.  Eyes:     General:        Right eye: No discharge.        Left eye: No discharge.     Conjunctiva/sclera: Conjunctivae normal.  Neck:     Musculoskeletal: No neck rigidity or muscular tenderness.  Cardiovascular:     Rate and Rhythm: Normal rate.  Pulmonary:     Effort: Pulmonary effort is normal.  Musculoskeletal:     Right lower leg: Edema (ankle +1) present.     Left lower leg: Edema (ankle +1) present.  Skin:    General: Skin is warm and dry.     Findings: No erythema or rash.  Neurological:     Mental Status:  He is alert and oriented to person, place, and time.  Psychiatric:        Behavior: Behavior normal.     Comments: Limited verbal during history, speech is appropriate but sparse, today he is more verbal. Participates in exam and follows commands well. Limited insight into condition.       Diabetic Foot Exam - Simple   Simple Foot Form Diabetic Foot exam was performed with the following findings: Yes 10/19/2019 10:27 AM  Visual Inspection See comments: Yes Sensation Testing Intact to touch and monofilament testing bilaterally: Yes Pulse Check Posterior Tibialis and Dorsalis pulse intact bilaterally: Yes Comments Bilateral overgrown toenails, dry skin, some callus formation, no ulceration. Mild ankle edema symmetrical.    Recent  Labs    10/19/19 1206  HGBA1C 6.5*    Results for orders placed or performed in visit on 10/19/19  [facility] - Hemoglobin A1c POCT (Hb A1C)  Result Value Ref Range   Hemoglobin A1C 6.5 (A) 4.0 - 5.6 %      Assessment & Plan:   Problem List Items Addressed This Visit    Type 2 diabetes mellitus with chronic kidney disease ([place])    Well-controlled DM with A1c 6.5 off all meds still Complications - CKD-IV - OFF insulin  Plan:  1. Remain off all meds 2. Encourage improved lifestyle maintain appropriate diet 3. Check CBG , bring log to next visit for review 4. Continue ASA - OFF ACEi, Statin - side effects / hypotension      Relevant Orders   [facility] - Hemoglobin A1c POCT (Hb A1C) (Completed)   Benign hypertension with CKD (chronic kidney disease) stage IV (HCC) - Primary    Slight elevated BP now Complication CKD-IV, Hypotension in past - Followed by Cardiology, Nephrology OFF Lisinopril, Hydralazine  1. Slight increase on Carvedilol from 6.25mg  BID - now to 12.5mg  in AM and 6.25mg  PM - new rx sent and ordered - future may need to increase PM dose to 12.5mg  if indicated based on readings 2. Encouraged improve hydration, caution sudden standing / change of position, fall precaution, some postural dizziness may occur 3. Monitor BP more regularly for 2 weeks, call with results - adjust med if need 4. Follow-up 3 months      Relevant Medications   carvedilol (COREG) 12.5 MG tablet   carvedilol (COREG) 6.25 MG tablet   Alzheimer's dementia with behavioral disturbance (HCC)    Stable without change See prior note for background Continue Donepezil 5mg  daily improved on med Follow-up as planned      Alcohol dependence in remission ([place])    Stable, Alcohol free       Other Visit Diagnoses    Needs flu shot       Relevant Orders   [facility] High Dose 2020+ Flu Vaccine QUAD High Dose(Fluad) (Completed)       Meds ordered this encounter  Medications  . carvedilol (COREG) 12.5 MG  tablet    Sig: Take 1 tablet (12.5 mg total) by mouth daily with breakfast.    Dispense:  90 tablet    Refill:  1  . carvedilol (COREG) 6.25 MG tablet    Sig: Take 1 tablet (6.25 mg total) by mouth daily with supper.    Dispense:  90 tablet    Refill:  1     Follow up plan: Return in about 3 months (around 01/19/2020) for 3 months for follow-up HTN.  Defer labs, only A1c today and rest labs through Nephrology.  Nobie Putnam, [facility] 10/19/2019, 10:12 AM

## 2019-10-19 NOTE — Assessment & Plan Note (Signed)
Stable without change See prior note for background Continue Donepezil 5mg  daily improved on med Follow-up as planned

## 2019-10-19 NOTE — Patient Instructions (Addendum)
Thank you for coming to the office today.  Increase morning Carvedilol dose from 6.25mg  up to 12.5mg  - then PM dose can stay same at 6.25mg  daily - new orders sent to pharmacy.  Call in 2 weeks to provide [place] with BP readings and we can adjust further if need, may need to increase up to 12.5mg  twice a day in future.  Flu shot today  [facility] [address], [place], [place] [address] Phone: [phone] [email or web address]  Stay tuned for A1c sugar result, can call you with this.   Please schedule a Follow-up Appointment to: Return in about 3 months (around 01/19/2020) for 3 months for follow-up HTN.  If you have any other questions or concerns, please feel free to call the office or send a message through [place]. You may also schedule an earlier appointment if necessary.  Additionally, you may be receiving a survey about your experience at our office within a few days to 1 week by e-mail or mail. We value your feedback.  Nobie Putnam, DO [place]

## 2019-11-15 DIAGNOSIS — E1122 Type 2 diabetes mellitus with diabetic chronic kidney disease: Secondary | ICD-10-CM | POA: Diagnosis not present

## 2019-11-15 DIAGNOSIS — N2581 Secondary hyperparathyroidism of renal origin: Secondary | ICD-10-CM | POA: Diagnosis not present

## 2019-11-15 DIAGNOSIS — N184 Chronic kidney disease, stage 4 (severe): Secondary | ICD-10-CM | POA: Diagnosis not present

## 2019-11-15 DIAGNOSIS — I10 Essential (primary) hypertension: Secondary | ICD-10-CM | POA: Diagnosis not present

## 2019-11-28 ENCOUNTER — Encounter: Payer: Self-pay | Admitting: Family Medicine

## 2019-11-28 ENCOUNTER — Ambulatory Visit (INDEPENDENT_AMBULATORY_CARE_PROVIDER_SITE_OTHER): Payer: Medicare Other | Admitting: Family Medicine

## 2019-11-28 ENCOUNTER — Other Ambulatory Visit: Payer: Self-pay

## 2019-11-28 DIAGNOSIS — R05 Cough: Secondary | ICD-10-CM

## 2019-11-28 DIAGNOSIS — R0781 Pleurodynia: Secondary | ICD-10-CM | POA: Diagnosis not present

## 2019-11-28 DIAGNOSIS — K5909 Other constipation: Secondary | ICD-10-CM

## 2019-11-28 DIAGNOSIS — R059 Cough, unspecified: Secondary | ICD-10-CM

## 2019-11-28 MED ORDER — GUAIFENESIN 100 MG/5ML PO SYRP: 200.0000 mg | ORAL_SOLUTION | Freq: Three times a day (TID) | ORAL | 5 refills | Status: AC | PRN

## 2019-11-28 MED ORDER — POLYETHYLENE GLYCOL 3350 17 GM/SCOOP PO POWD: 17.0000 g | Freq: Every day | ORAL | 5 refills | Status: AC | PRN

## 2019-11-28 MED ORDER — DICLOFENAC SODIUM 1 % EX GEL: 2.0000 g | Freq: Three times a day (TID) | CUTANEOUS | 5 refills | Status: AC | PRN

## 2019-11-28 MED ORDER — DOCUSATE SODIUM 50 MG/5ML PO LIQD: 100.0000 mg | Freq: Three times a day (TID) | ORAL | 5 refills | Status: AC | PRN

## 2019-11-28 NOTE — Progress Notes (Signed)
Virtual Visit via Telephone The purpose of this virtual visit is to provide medical care while limiting exposure to the novel coronavirus (COVID19) for both patient and office staff.  Consent was obtained for phone visit:  Yes.   Answered questions that patient had about telehealth interaction:  Yes.   I discussed the limitations, risks, security and privacy concerns of performing an evaluation and management service by telephone. I also discussed with the patient that there may be a patient responsible charge related to this service. The patient expressed understanding and agreed to proceed.  Patient Location: Home Provider Location: [facility])  ---------------------------------------------------------------------- Chief Complaint  Patient presents with  . Fatigue    Rib pain mostly right side referred to abdomial area as per cargiver    S: Reviewed CMA documentation. I have called patient and gathered additional HPI as follows from Melida Quitter (homeowner of patient's medical home) - also spoke with patient briefly as well - he has Alzheimer's Dementia unable to obtain history  R sided Pib Pain / lower Abdominal pain History of constipation Reports onset symptoms last week Friday 11/24/19, mild onset, then more gradual worsening over weekend and Monday more significant pain. They called to schedule apt. He has limited ability to walk due to pain. He has some reduced appetite now it seems, eating about half of plate of food. Drinking liquids fairly well. No other associated symptoms at this time that contribute to his symptom. He had issue in past with back pain and complication from baclofen muscle relaxant, he has only taken Tylenol PRN rarely takes other stronger medications for pain. In past constipation has caused similar discomfort, he has improved from using stool softeners. Now has docusate capsules but takes '100mg'$  once daily PRN but he chews it often and it  doesn't work. - Home BP readings have been normal. Occasional mild elevated 140s. - Denies coughing or other activity to strain rib. - He had BM few days ago recently - No prior abdominal surgery - No fall or trauma injury  Denies any high risk travel to areas of current concern for COVID19. Denies any known or suspected exposure to person with or possibly with COVID19.  Denies any fevers, chills, sweats, body ache, cough, shortness of breath, sinus pain or pressure, headache, abdominal pain, diarrhea  Past Surgical History:  Procedure Laterality Date  . COLONOSCOPY WITH PROPOFOL N/A 09/11/2019   Procedure: COLONOSCOPY WITH PROPOFOL;  Surgeon: Jonathon Bellows, MD;  Location[facility] ENDOSCOPY;  Service: Gastroenterology;  Laterality: N/A;  . TONSILLECTOMY      Past Medical History:  Diagnosis Date  . Alcohol abuse   . Alzheimer's dementia with behavioral disturbance ([place])   . Anemia of chronic disease   . Cardiomyopathy due to hypertension, without heart failure ([place])   . Hyperlipidemia   . Lives in group home   . Myocardial infarction ([place])   . Stroke [facility])    Social History   Tobacco Use  . Smoking status: Current Some Day Smoker    Packs/day: 0.25    Types: Cigarettes  . Smokeless tobacco: Never Used  . Tobacco comment: 5-6 day   Substance Use Topics  . Alcohol use: Not Currently    Alcohol/week: 0.0 standard drinks  . Drug use: No    Current Outpatient Medications:  .  ACETAMINOPHEN EXTRA STRENGTH 500 MG tablet, TAKE 2 TABLETS BY MOUTH EVERY 8 HOURS AS NEEDED FOR MODERATE PAIN, Disp: 90 tablet, Rfl: 3 .  alum &  mag hydroxide-simeth (MAALOX/MYLANTA) 200-200-20 MG/5ML suspension, Take by mouth every 6 (six) hours as needed for indigestion or heartburn., Disp: , Rfl:  .  aspirin 81 MG chewable tablet, TAKE 1 TABLET BY MOUTH ONCE DAILY FOR ANTICOAGULATION, Disp: 90 tablet, Rfl: 3 .  bismuth subsalicylate (PEPTO BISMOL) 262 MG chewable tablet, Chew 262 mg by mouth daily as  needed for indigestion or diarrhea or loose stools., Disp: , Rfl:  .  Blood Glucose Monitoring Suppl (ACCU-CHEK NANO SMARTVIEW) w/Device KIT, Use only for as needed glucose checks if symptomatic., Disp: 1 kit, Rfl: 0 .  carvedilol (COREG) 12.5 MG tablet, Take 1 tablet (12.5 mg total) by mouth daily with breakfast., Disp: 90 tablet, Rfl: 1 .  carvedilol (COREG) 6.25 MG tablet, Take 1 tablet (6.25 mg total) by mouth daily with supper., Disp: 90 tablet, Rfl: 1 .  donepezil (ARICEPT) 5 MG tablet, TAKE 1 TABLET BY MOUTH EVERY MORNING., Disp: 90 tablet, Rfl: 1 .  glucose blood (ACCU-CHEK SMARTVIEW) test strip, Check once as needed only if symptoms of hypoglycemia (dizziness, tremors, shaking), Disp: 50 each, Rfl: 12 .  guaifenesin (ROBITUSSIN) 100 MG/5ML syrup, Take 10 mLs (200 mg total) by mouth 3 (three) times daily as needed for cough., Disp: 120 mL, Rfl: 5 .  isosorbide mononitrate (IMDUR) 30 MG 24 hr tablet, TAKE 1 TABLET BY MOUTH ONCE DAILY FOR HEART. DO NOT CRUSH., Disp: 90 tablet, Rfl: 3 .  loperamide (IMODIUM) 2 MG capsule, Take 2 mg by mouth as needed for diarrhea or loose stools., Disp: , Rfl:  .  magnesium hydroxide (MILK OF MAGNESIA) 400 MG/5ML suspension, Take 15 mLs by mouth daily as needed for mild constipation., Disp: , Rfl:  .  Melatonin 10 MG TABS, Take by mouth at bedtime as needed., Disp: , Rfl:  .  neomycin-bacitracin-polymyxin (NEOSPORIN) [phone] ointment, Apply topically as needed., Disp: , Rfl:  .  diclofenac Sodium (VOLTAREN) 1 % GEL, Apply 2 g topically 3 (three) times daily as needed., Disp: 100 g, Rfl: 5 .  docusate (COLACE) 50 MG/5ML liquid, Take 10 mLs (100 mg total) by mouth every 8 (eight) hours as needed for mild constipation or moderate constipation., Disp: 237 mL, Rfl: 5 .  polyethylene glycol powder (GLYCOLAX/MIRALAX) 17 GM/SCOOP powder, Take 17 g by mouth daily as needed for moderate constipation or severe constipation., Disp: 255 g, Rfl: 5  Depression screen [facility]  2/9 11/28/2019 10/19/2019 07/25/2019  Decreased Interest 0 0 0  Down, Depressed, Hopeless 0 0 0  PHQ - 2 Score 0 0 0    No flowsheet data found.  -------------------------------------------------------------------------- O: No physical exam performed due to remote telephone encounter.  There were no vitals taken for this visit.  Lab results reviewed.  Recent Results (from the past 2160 hour(s))  SARS CORONAVIRUS 2 (TAT 6-24 HRS) Nasopharyngeal Nasopharyngeal Swab     Status: None   Collection Time: 09/07/19  1:13 PM   Specimen: Nasopharyngeal Swab  Result Value Ref Range   SARS Coronavirus 2 NEGATIVE NEGATIVE    Comment: (NOTE) SARS-CoV-2 target nucleic acids are NOT DETECTED. The SARS-CoV-2 RNA is generally detectable in upper and lower respiratory specimens during the acute phase of infection. Negative results do not preclude SARS-CoV-2 infection, do not rule out co-infections with other pathogens, and should not be used as the sole basis for treatment or other patient management decisions. Negative results must be combined with clinical observations, patient history, and epidemiological information. The expected result is Negative. Fact Sheet for Patients:  SugarRoll.be Fact Sheet for Healthcare Providers: https://www.woods-mathews.com/ This test is not yet approved or cleared by the [place] FDA and  has been authorized for detection and/or diagnosis of SARS-CoV-2 by FDA under an Emergency Use Authorization (EUA). This EUA will remain  in effect (meaning this test can be used) for the duration of the COVID-19 declaration under Section 56 4(b)(1) of the Act, 21 U.S.C. section 360bbb-3(b)(1), unless the authorization is terminated or revoked sooner. Performed at [facility], [place] [address], [place], [place] [address]   Glucose, capillary     Status: Abnormal   Collection Time: 09/11/19  9:49 AM  Result Value Ref Range    Glucose-Capillary 59 (L) 70 - 99 mg/dL  [facility] - Hemoglobin A1c POCT (Hb A1C)     Status: Abnormal   Collection Time: 10/19/19 12:06 PM  Result Value Ref Range   Hemoglobin A1C 6.5 (A) 4.0 - 5.6 %    -------------------------------------------------------------------------- A&P:  Problem List Items Addressed This Visit    None    Visit Diagnoses    Rib pain on right side    -  Primary   Relevant Medications   diclofenac Sodium (VOLTAREN) 1 % GEL   Other constipation       Relevant Medications   docusate (COLACE) 50 MG/5ML liquid   polyethylene glycol powder (GLYCOLAX/MIRALAX) 17 GM/SCOOP powder   Cough       Relevant Medications   guaifenesin (ROBITUSSIN) 100 MG/5ML syrup     Non specific complaint of R sided rib pain / abdominal discomfort Seems gradual worse, but no other related symptoms that seem to contribute Poor historian due to cognitive decline with Alzheimer's dementia  Plan Offer several treatment options today - Improve bowel regimen to rule out constipation, given prior history of similar and some infrequent BMs. Switch docusate capsule to liquid form same dose '100mg'$  daily PRN instead of capsules he was chewing them - Add Miralax 17g Daily PRN moderate to severe constipation as next option if need - Re order existing diclofenac voltaren topical can use on ribcage area of pain - may try OTC icy hot cream topical as well - May take Tylenol PRN - Re order existing standing order Guaifenesin for cough to avoid further rib strain injury if contributing.  Specific return criteria given if not improved or any worsening. May need to seek care at hospital or ED if any significant worsening, given uncertain nature of this problem.  Meds ordered this encounter  Medications  . docusate (COLACE) 50 MG/5ML liquid    Sig: Take 10 mLs (100 mg total) by mouth every 8 (eight) hours as needed for mild constipation or moderate constipation.    Dispense:  237 mL    Refill:  5   . polyethylene glycol powder (GLYCOLAX/MIRALAX) 17 GM/SCOOP powder    Sig: Take 17 g by mouth daily as needed for moderate constipation or severe constipation.    Dispense:  255 g    Refill:  5  . guaifenesin (ROBITUSSIN) 100 MG/5ML syrup    Sig: Take 10 mLs (200 mg total) by mouth 3 (three) times daily as needed for cough.    Dispense:  120 mL    Refill:  5  . diclofenac Sodium (VOLTAREN) 1 % GEL    Sig: Apply 2 g topically 3 (three) times daily as needed.    Dispense:  100 g    Refill:  5    Follow-up: - Return in 1 week as needed  if not improve  Patient verbalizes understanding with the above medical recommendations including the limitation of remote medical advice.  Specific follow-up and call-back criteria were given for patient to follow-up or seek medical care more urgently if needed.   - Time spent in direct consultation with patient on phone: 11 minutes  Nobie Putnam, [facility] 11/28/2019, 1:42 PM

## 2019-11-30 ENCOUNTER — Emergency Department (HOSPITAL_COMMUNITY)
Admission: EM | Admit: 2019-11-30 | Discharge: 2019-12-15 | Disposition: E | Payer: Medicare Other | Attending: Emergency Medicine | Admitting: Emergency Medicine

## 2019-11-30 DIAGNOSIS — E1122 Type 2 diabetes mellitus with diabetic chronic kidney disease: Secondary | ICD-10-CM | POA: Diagnosis not present

## 2019-11-30 DIAGNOSIS — N184 Chronic kidney disease, stage 4 (severe): Secondary | ICD-10-CM | POA: Insufficient documentation

## 2019-11-30 DIAGNOSIS — G309 Alzheimer's disease, unspecified: Secondary | ICD-10-CM | POA: Insufficient documentation

## 2019-11-30 DIAGNOSIS — Z79899 Other long term (current) drug therapy: Secondary | ICD-10-CM | POA: Diagnosis not present

## 2019-11-30 DIAGNOSIS — I469 Cardiac arrest, cause unspecified: Secondary | ICD-10-CM | POA: Diagnosis present

## 2019-11-30 DIAGNOSIS — F17210 Nicotine dependence, cigarettes, uncomplicated: Secondary | ICD-10-CM | POA: Insufficient documentation

## 2019-11-30 DIAGNOSIS — Z7982 Long term (current) use of aspirin: Secondary | ICD-10-CM | POA: Diagnosis not present

## 2019-11-30 DIAGNOSIS — Z20828 Contact with and (suspected) exposure to other viral communicable diseases: Secondary | ICD-10-CM | POA: Diagnosis not present

## 2019-11-30 DIAGNOSIS — F0280 Dementia in other diseases classified elsewhere without behavioral disturbance: Secondary | ICD-10-CM | POA: Insufficient documentation

## 2019-11-30 DIAGNOSIS — Z8673 Personal history of transient ischemic attack (TIA), and cerebral infarction without residual deficits: Secondary | ICD-10-CM | POA: Insufficient documentation

## 2019-11-30 LAB — RESPIRATORY PANEL BY RT PCR (FLU A&B, COVID)
Influenza A by PCR: NEGATIVE
Influenza B by PCR: NEGATIVE
SARS Coronavirus 2 by RT PCR: NEGATIVE

## 2019-11-30 MED ORDER — AMIODARONE HCL 150 MG/3ML IV SOLN
INTRAVENOUS | Status: AC | PRN
  Administered 2019-11-30: 150 mg via INTRAVENOUS

## 2019-11-30 MED ORDER — EPINEPHRINE 1 MG/10ML IJ SOSY
PREFILLED_SYRINGE | INTRAMUSCULAR | Status: AC | PRN
  Administered 2019-11-30 (×2): 1 mg via INTRAVENOUS

## 2019-12-15 NOTE — Progress Notes (Signed)
Pt brought in EMS CPR in progress.  Pt has king airway in place, pt intubated by MD with 8.0 ETT. Positive CO2 color change, equal bbs. Secured at 24 at lip.

## 2019-12-15 NOTE — ED Provider Notes (Signed)
[facility] EMERGENCY DEPARTMENT Provider Note   CSN: [id number] Arrival date & time: 08-Dec-2019  0751     History Chief Complaint  Patient presents with  . Cardiac Arrest    Kyle SHINAULT is a 74 y.o. male.  The history is provided by the EMS personnel and medical records. The history is limited by the condition of the patient.  Cardiac Arrest Incident location:  Home (group home) Time since incident:  1 hour Condition upon EMS arrival:  Apneic Pulse:  Absent Initial cardiac rhythm per EMS:  PEA Treatments prior to arrival:  ACLS protocol, AED discharged and vascular access Medications given prior to ED:  Epinephrine and amiodarone Number of shocks delivered:  2 Defibrillation successful: no   Rhythm after defibrillation:  Unchanged IV access type:  Peripheral Airway:  Intubation in ED Rhythm on admission to ED:  Asystole   LVL 5 caveat for cardiac arrest     Past Medical History:  Diagnosis Date  . Alcohol abuse   . Alzheimer's dementia with behavioral disturbance ([place])   . Anemia of chronic disease   . Cardiomyopathy due to hypertension, without heart failure ([place])   . Hyperlipidemia   . Lives in group home   . Myocardial infarction ([place])   . Stroke [facility])     Patient Active Problem List   Diagnosis Date Noted  . Hepatitis C, chronic ([place]) 01/13/2018  . Chest pain 12/22/2017  . Head trauma 12/13/2017  . History of alcohol abuse 12/13/2017  . Lives in group home 02/10/2017  . Secondary cardiomyopathy ([place]) 10/14/2015  . History of MI (myocardial infarction) 10/14/2015  . History of orthostatic hypotension 09/11/2015  . Syncope 09/11/2015  . Chronic kidney disease, stage 4, severely decreased GFR (HCC) 07/26/2015  . Benign hypertension with CKD (chronic kidney disease) stage IV ([place]) 07/26/2015  . 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 Alcohol dependence in remission ([place]) 07/25/2015  . HLD  (hyperlipidemia) 07/25/2015  . History of CVA with residual deficit 07/25/2015  . Alzheimer's dementia with behavioral disturbance ([place]) 06/07/2015  . Current tobacco use 02/26/2015  . Bilateral leg weakness 02/26/2015    Past Surgical History:  Procedure Laterality Date  . COLONOSCOPY WITH PROPOFOL N/A 09/11/2019   Procedure: COLONOSCOPY WITH PROPOFOL;  Surgeon: Jonathon Bellows, MD;  Location: [facility] ENDOSCOPY;  Service: Gastroenterology;  Laterality: N/A;  . TONSILLECTOMY         Family History  Problem Relation Age of Onset  . Hypertension Other   . Hypertension Mother   . Diabetes Father     Social History   Tobacco Use  . Smoking status: Current Some Day Smoker    Packs/day: 0.25    Types: Cigarettes  . Smokeless tobacco: Never Used  . Tobacco comment: 5-6 day   Substance Use Topics  . Alcohol use: Not Currently    Alcohol/week: 0.0 standard drinks  . Drug use: No    Home Medications Prior to Admission medications   Medication Sig Start Date End Date Taking? Authorizing Provider  ACETAMINOPHEN EXTRA STRENGTH 500 MG tablet TAKE 2 TABLETS BY MOUTH EVERY 8 HOURS AS NEEDED FOR MODERATE PAIN 07/07/19   Parks Ranger, Devonne Doughty, DO  alum & mag hydroxide-simeth (MAALOX/MYLANTA) 200-200-20 MG/5ML suspension Take by mouth every 6 (six) hours as needed for indigestion or heartburn.    [provider]  aspirin 81 MG chewable tablet TAKE 1 TABLET BY MOUTH ONCE DAILY FOR ANTICOAGULATION 01/23/19  Karamalegos, Devonne Doughty, DO  bismuth subsalicylate (PEPTO BISMOL) 262 MG chewable tablet Chew 262 mg by mouth daily as needed for indigestion or diarrhea or loose stools.    [provider]  Blood Glucose Monitoring Suppl (ACCU-CHEK NANO SMARTVIEW) w/Device KIT Use only for as needed glucose checks if symptomatic. 02/24/17   Karamalegos, Devonne Doughty, DO  carvedilol (COREG) 12.5 MG tablet Take 1 tablet (12.5 mg total) by mouth daily with breakfast. 10/19/19   Parks Ranger,  Devonne Doughty, DO  carvedilol (COREG) 6.25 MG tablet Take 1 tablet (6.25 mg total) by mouth daily with supper. 10/19/19   Karamalegos, Devonne Doughty, DO  diclofenac Sodium (VOLTAREN) 1 % GEL Apply 2 g topically 3 (three) times daily as needed. 11/28/19   Karamalegos, Devonne Doughty, DO  docusate (COLACE) 50 MG/5ML liquid Take 10 mLs (100 mg total) by mouth every 8 (eight) hours as needed for mild constipation or moderate constipation. 11/28/19   Karamalegos, Devonne Doughty, DO  donepezil (ARICEPT) 5 MG tablet TAKE 1 TABLET BY MOUTH EVERY MORNING. 10/17/19   Karamalegos, Devonne Doughty, DO  glucose blood (ACCU-CHEK SMARTVIEW) test strip Check once as needed only if symptoms of hypoglycemia (dizziness, tremors, shaking) 02/24/17   Karamalegos, Devonne Doughty, DO  guaifenesin (ROBITUSSIN) 100 MG/5ML syrup Take 10 mLs (200 mg total) by mouth 3 (three) times daily as needed for cough. 11/28/19   Karamalegos, Devonne Doughty, DO  isosorbide mononitrate (IMDUR) 30 MG 24 hr tablet TAKE 1 TABLET BY MOUTH ONCE DAILY FOR HEART. DO NOT CRUSH. 01/23/19   Olin Hauser, DO  loperamide (IMODIUM) 2 MG capsule Take 2 mg by mouth as needed for diarrhea or loose stools.    [provider]  magnesium hydroxide (MILK OF MAGNESIA) 400 MG/5ML suspension Take 15 mLs by mouth daily as needed for mild constipation.    [provider]  Melatonin 10 MG TABS Take by mouth at bedtime as needed.    [provider]  neomycin-bacitracin-polymyxin (NEOSPORIN) [phone] ointment Apply topically as needed.    [provider]  polyethylene glycol powder (GLYCOLAX/MIRALAX) 17 GM/SCOOP powder Take 17 g by mouth daily as needed for moderate constipation or severe constipation. 11/28/19   Karamalegos, Devonne Doughty, DO    Allergies    Baclofen  Review of Systems   Review of Systems  Unable to perform ROS: Patient unresponsive    Physical Exam Updated Vital Signs There were no vitals taken for this  visit.  Physical Exam Vitals and nursing note reviewed.  Constitutional:      General: He is in acute distress.  HENT:     Nose: No congestion or rhinorrhea.     Mouth/Throat:     Mouth: Mucous membranes are moist.     Pharynx: No oropharyngeal exudate.  Eyes:     Conjunctiva/sclera: Conjunctivae normal.  Cardiovascular:     Heart sounds: No murmur.     Comments: PEA arrest on pulse checks initially.  One episode of V. fib which was defibrillated.  Returned to PEA then asystole. Pulmonary:     Breath sounds: Rhonchi present.  Chest:     Chest wall: No tenderness.  Abdominal:     General: Abdomen is flat.     Tenderness: There is no abdominal tenderness.  Musculoskeletal:        General: No tenderness.  Skin:    Findings: No erythema.  Neurological:     Mental Status: He is unresponsive.     GCS: GCS eye subscore is  1. GCS verbal subscore is 1. GCS motor subscore is 1.     Comments: Patient unresponsive with GCS of 3.  Pupils are 3 mm and nonreactive.     ED Results / Procedures / Treatments   Labs (all labs ordered are listed, but only abnormal results are displayed) Labs Reviewed  RESPIRATORY PANEL BY RT PCR (FLU A&B, COVID)    EKG None  Radiology No results found.  Procedures Procedure Name: Intubation Date/Time: 12/13/2019 8:08 AM Performed by: Courtney Paris, MD Pre-anesthesia Checklist: Emergency Drugs available and Suction available Laryngoscope Size: Glidescope Grade View: Grade I Tube size: 8.0 mm Number of attempts: 1 Placement Confirmation: ETT inserted through vocal cords under direct vision,  CO2 detector and Breath sounds checked- equal and bilateral Secured at: 22 cm Tube secured with: Tape Dental Injury: Teeth and Oropharynx as per pre-operative assessment       (including critical care time)  CRITICAL CARE Performed by: Gwenyth Allegra Decari Duggar Total critical care time: 35 minutes Critical care time was exclusive of separately  billable procedures and treating other patients. Critical care was necessary to treat or prevent imminent or life-threatening deterioration. Critical care was time spent personally by me on the following activities: development of treatment plan with patient and/or surrogate as well as nursing, discussions with consultants, evaluation of patient's response to treatment, examination of patient, obtaining history from patient or surrogate, ordering and performing treatments and interventions, ordering and review of laboratory studies, ordering and review of radiographic studies, pulse oximetry and re-evaluation of patient's condition.  Cardiopulmonary Resuscitation (CPR) Procedure Note Directed/Performed by: Gwenyth Allegra Lennyn Bellanca I personally directed ancillary staff and/or performed CPR in an effort to regain return of spontaneous circulation and to maintain cardiac, neuro and systemic perfusion.      Medications Ordered in ED Medications - No data to display  ED Course  I have reviewed the triage vital signs and the nursing notes.  Pertinent labs & imaging results that were available during my care of the patient were reviewed by me and considered in my medical decision making (see chart for details).    MDM Rules/Calculators/A&P                      Kyle Short is a 74 y.o. male with a past medical history significant for prior MI, CKD, secondary cardiomyopathy, Alzheimer's dementia, diabetes, hyperlipidemia, prior stroke, and lives in a group home who presents in cardiac arrest.  According to EMS, patient was found down in the shower and had PEA arrest.  They suspect he was down for 10 minutes or so prior to being found down in the shower.  Patient had approximately 1 hour of CPR on scene and during transport including 2 episodes of brief ROSC.  Patient had approximately 11 epi administrations as well as amiodarone.  Patient had a King airway in place during transport.  There was no  evidence of external trauma and no other history was provided.  EKG during one of the episodes of ROSC did show inferior STEMI however we were unable to get his pulses back on arrival.  On arrival, patient is an active CPR.  On exam, patient is completely unresponsive with a GCS of 3.  King airway was in place.  Patient's King airway was switched for an endotracheal tube.  He was intubated without any difficulty.  No gag reflex or blink to threat.  No painful response.  Pupils were 3 mm and nonresponsive.  Patient had no pulses on pulse checks.  After several rounds further of CPR after intubation and continuing amiodarone and epi, we are unable to get ROSC.  On one episode of the pulse check, he did have V. fib which was shocked and CPR was continued.  After over 1 hour of resuscitation without ability to get ROSC, we determined it was time to terminate resuscitative efforts.  We were unable to get blood work or EKG on arrival as we never were able to obtain ROSC.  Time of death was declared at 7:58 AM.   We will attempt to call patient's family for notification of death and PCP for death certificate.  8:18 AM Just spoke with the patient's legal guardian Dujuan Stankowski ([phone]) and she was informed of the patient's death.  She reports that the facility told her that he was brought to the emergency department for "shortness of breath and cramps and feeling bad".  Coronavirus test was sent.  Covid test returned negative for Covid and for influenza.  We will call PCP about death certificate.  10:50 AM Just talked to Dr. Neoma Laming who is the patient's PCP and he reports he will fill the death certificate.  Patient will be transferred to the morgue.    Final Clinical Impression(s) / ED Diagnoses Final diagnoses:  Cardiac arrest ([place])     Clinical Impression: 1. Cardiac arrest [facility])     Disposition: Expired  This note was prepared with assistance of Dragon voice recognition  software. Occasional wrong-word or sound-a-like substitutions may have occurred due to the inherent limitations of voice recognition software.     Jalen Daluz, Gwenyth Allegra, MD December 14, 2019 1050

## 2019-12-15 NOTE — ED Triage Notes (Signed)
Pt arrives via EMS from group home, found pulseless, apneic in shower. Fire arrival 3 rounds of CPR, no shockable rhythm per AED. PEA on EMS arrival, 3 epis, got pulses back, EKG showed inferior STEMI, paced by EMS, lost pulses, resumed CPR, 6 more epi, cbg 224. King airway in place. 18g RAC. CPR on arrival.

## 2019-12-15 NOTE — ED Notes (Signed)
[facility] referral [phone], Tonye Becket, suitable for tissue donation only.

## 2019-12-15 DEATH — deceased

## 2020-01-25 ENCOUNTER — Ambulatory Visit: Payer: Medicare Other | Admitting: Family Medicine

## 2022-11-17 ENCOUNTER — Other Ambulatory Visit: Payer: Self-pay | Admitting: *Deleted
